# Patient Record
Sex: Female | Born: 1990 | Race: Black or African American | Hispanic: No | Marital: Single | Smoking: Former smoker
Health system: Southern US, Community
[De-identification: ages and names within clinical notes are randomized; demographics above are authoritative.]

## PROBLEM LIST (undated history)

## (undated) ENCOUNTER — Inpatient Hospital Stay (HOSPITAL_COMMUNITY): Payer: Self-pay

## (undated) ENCOUNTER — Inpatient Hospital Stay: Admission: EM | Payer: Self-pay | Source: Home / Self Care

## (undated) DIAGNOSIS — Z789 Other specified health status: Secondary | ICD-10-CM

## (undated) DIAGNOSIS — K8020 Calculus of gallbladder without cholecystitis without obstruction: Secondary | ICD-10-CM

## (undated) HISTORY — PX: WISDOM TOOTH EXTRACTION: SHX21

## (undated) HISTORY — PX: DILATION AND CURETTAGE OF UTERUS: SHX78

---
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
## 2011-03-13 ENCOUNTER — Emergency Department (HOSPITAL_COMMUNITY)
Admission: EM | Admit: 2011-03-13 | Discharge: 2011-03-14 | Disposition: A | Discharge status: Home / Self Care | Payer: Medicaid Other | Attending: Emergency Medicine | Admitting: Emergency Medicine

## 2011-03-13 DIAGNOSIS — H53149 Visual discomfort, unspecified: Secondary | ICD-10-CM | POA: Insufficient documentation

## 2011-03-13 DIAGNOSIS — R51 Headache: Secondary | ICD-10-CM | POA: Insufficient documentation

## 2011-03-13 DIAGNOSIS — M2569 Stiffness of other specified joint, not elsewhere classified: Secondary | ICD-10-CM | POA: Insufficient documentation

## 2011-03-13 DIAGNOSIS — J3489 Other specified disorders of nose and nasal sinuses: Secondary | ICD-10-CM | POA: Insufficient documentation

## 2011-03-13 DIAGNOSIS — J329 Chronic sinusitis, unspecified: Secondary | ICD-10-CM | POA: Insufficient documentation

## 2011-03-14 ENCOUNTER — Emergency Department (HOSPITAL_COMMUNITY): Payer: Medicaid Other

## 2011-06-05 ENCOUNTER — Emergency Department (HOSPITAL_COMMUNITY)
Admission: EM | Admit: 2011-06-05 | Discharge: 2011-06-05 | Disposition: A | Discharge status: Home / Self Care | Payer: Medicaid Other | Attending: Emergency Medicine | Admitting: Emergency Medicine

## 2011-06-05 DIAGNOSIS — N390 Urinary tract infection, site not specified: Secondary | ICD-10-CM | POA: Insufficient documentation

## 2011-06-05 DIAGNOSIS — M545 Low back pain, unspecified: Secondary | ICD-10-CM | POA: Insufficient documentation

## 2011-06-05 DIAGNOSIS — R109 Unspecified abdominal pain: Secondary | ICD-10-CM | POA: Insufficient documentation

## 2011-06-05 LAB — URINALYSIS, ROUTINE W REFLEX MICROSCOPIC
Glucose, UA: NEGATIVE mg/dL
Ketones, ur: NEGATIVE mg/dL
Protein, ur: NEGATIVE mg/dL
pH: 6.5 (ref 5.0–8.0)

## 2011-06-05 LAB — URINE MICROSCOPIC-ADD ON

## 2011-06-08 LAB — URINE CULTURE
Colony Count: >=100000
Culture  Setup Time: 201210281123

## 2011-06-17 ENCOUNTER — Telehealth (HOSPITAL_COMMUNITY): Payer: Self-pay | Admitting: Emergency Medicine

## 2011-06-17 NOTE — ED Notes (Signed)
Rx for bactrim ds #6 -1 po bd x 3 days need to be called to pharmacy written by C.Schinlever,PAC.

## 2012-01-12 ENCOUNTER — Emergency Department (INDEPENDENT_AMBULATORY_CARE_PROVIDER_SITE_OTHER)
Admission: EM | Admit: 2012-01-12 | Discharge: 2012-01-12 | Disposition: A | Discharge status: Home / Self Care | Payer: Medicaid Other | Source: Home / Self Care | Attending: Family Medicine | Admitting: Family Medicine

## 2012-01-12 ENCOUNTER — Encounter (HOSPITAL_COMMUNITY): Payer: Self-pay | Admitting: *Deleted

## 2012-01-12 ENCOUNTER — Emergency Department (HOSPITAL_COMMUNITY)
Admission: EM | Admit: 2012-01-12 | Discharge: 2012-01-12 | Disposition: A | Discharge status: Home / Self Care | Payer: Medicaid Other

## 2012-01-12 DIAGNOSIS — B9689 Other specified bacterial agents as the cause of diseases classified elsewhere: Secondary | ICD-10-CM

## 2012-01-12 DIAGNOSIS — L0291 Cutaneous abscess, unspecified: Secondary | ICD-10-CM

## 2012-01-12 MED ORDER — TRAMADOL HCL 50 MG PO TABS: 50.0000 mg | ORAL_TABLET | Freq: Four times a day (QID) | ORAL | Status: AC | PRN

## 2012-01-12 MED ORDER — PREDNISONE 20 MG PO TABS: ORAL_TABLET | ORAL | Status: AC

## 2012-01-12 MED ORDER — IBUPROFEN 600 MG PO TABS: 600.0000 mg | ORAL_TABLET | Freq: Three times a day (TID) | ORAL | Status: AC

## 2012-01-12 MED ORDER — HYDROXYZINE HCL 25 MG PO TABS: 25.0000 mg | ORAL_TABLET | Freq: Three times a day (TID) | ORAL | Status: AC | PRN

## 2012-01-12 MED ORDER — CEPHALEXIN 500 MG PO CAPS: 500.0000 mg | ORAL_CAPSULE | Freq: Four times a day (QID) | ORAL | Status: AC

## 2012-01-12 NOTE — ED Provider Notes (Signed)
History     CSN: [id number]  Arrival date & time 01/12/12  1117   First MD Initiated Contact with Patient 01/12/12 1140      Chief Complaint  Patient presents with  . Foot Pain    (Consider location/radiation/quality/duration/timing/severity/associated sxs/prior treatment) HPI Comments: 21 year old nondiabetic female. Here complaining of left foot pain, swelling and itchiness for 3 days. Patient states she fell itchiness and was scratching 3 days ago causing abrasions in the dorsum of her left foot. Food got really swollen and tender yesterday. She does have a phone picture of her swollen foot. 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   History reviewed. No pertinent past medical history.  History reviewed. No pertinent past surgical history.  Family History  Problem Relation Age of Onset  . Hypertension Mother   . Diabetes Mother   . Diabetes Other     History  Substance Use Topics  . Smoking status: Former Smoker    Quit date: 01/12/2011  . Smokeless tobacco: Not on file  . Alcohol Use: No    OB History    Grav Para Term Preterm Abortions TAB SAB Ect Mult Living                  Review of Systems  Constitutional: Negative for fever and chills.  Gastrointestinal: Negative for nausea and vomiting.  Skin:       As per HPI  Neurological: Negative for headaches.  All other systems reviewed and are negative.    Allergies  Review of patient's allergies indicates no known allergies.  Home Medications   Current Outpatient Rx  Name Route Sig Dispense Refill  . CEPHALEXIN 500 MG PO CAPS Oral Take 1 capsule (500 mg total) by mouth 4 (four) times daily. 20 capsule 0  . HYDROXYZINE HCL 25 MG PO TABS Oral Take 1 tablet (25 mg total) by mouth every 8 (eight) hours as needed for itching. 12 tablet 0  . IBUPROFEN 600 MG PO TABS Oral Take 1 tablet (600 mg total) by mouth 3 (three) times daily after meals. 30 tablet 0  . PREDNISONE 20 MG PO TABS  1 tab po bid for 3  days 6 tablet 0  . TRAMADOL HCL 50 MG PO TABS Oral Take 1 tablet (50 mg total) by mouth every 6 (six) hours as needed for pain. 15 tablet 0    BP 111/72  Pulse 74  Temp(Src) 98.5 F (36.9 C) (Oral)  Resp 14  SpO2 97%  Physical Exam  Nursing note and vitals reviewed. Constitutional: She is oriented to person, place, and time. She appears well-developed and well-nourished. No distress.  HENT:  Head: Normocephalic and atraumatic.  Cardiovascular: Normal heart sounds.   Pulmonary/Chest: Breath sounds normal.  Neurological: She is alert and oriented to person, place, and time.  Skin:       Superficial linear abrasions with yellow crust and mild base skin swelling in dorsum of left foot, no skin thickening, no fluctuations or focal tenderness, no significant or striking erythema.     ED Course  Procedures (including critical care time)  Labs Reviewed - No data to display No results found.   1. Superficial bacterial skin infection       MDM  Impress allergic inflammatory reaction to insect bites given non erythematous swelling and itchiness, complicated by scratching and possible early signs of infection appear superficial. Swelling already improving and almost resolved compared with yesterdays picture. Prescribed prednisone for 3 days,  ibuprofen, keflex and hydroxyzine.  Return for recheck in 48 h if persistent symptoms return earlier if worsening symptoms despite following treatment.        Sharin Grave, MD 01/15/12 [id number]

## 2012-01-12 NOTE — ED Notes (Signed)
Pt did not answer x 1 

## 2012-01-12 NOTE — ED Notes (Signed)
Pt had itching of her left foot 3 days ago and she has been scratching a lot.  C/o pain dorsum of the  Left  Foot with abrasions noted

## 2012-01-12 NOTE — Discharge Instructions (Signed)
Impression is that you have a superficial skin infection. It is possible that she had an insect bite causing an allergic reaction given day swelling you had just today based on that picture your phone.  Is reassuring that there is no redness or swelling on exam today.  Keep foot elevated, can use ice pad. Take the prescribed medications as instructed. Can also add over-the-counter antibiotic ointment to put over the abrasions. Avoid scratching. Return in 48 hours for recheck if persistent symptoms or return earlier if worsening symptoms like pain, redness, swelling or drainage.

## 2012-02-05 ENCOUNTER — Encounter (HOSPITAL_COMMUNITY): Payer: Self-pay | Admitting: Emergency Medicine

## 2012-02-05 ENCOUNTER — Emergency Department (INDEPENDENT_AMBULATORY_CARE_PROVIDER_SITE_OTHER)
Admission: EM | Admit: 2012-02-05 | Discharge: 2012-02-05 | Disposition: A | Discharge status: Home / Self Care | Payer: Medicaid Other | Source: Home / Self Care | Attending: Family Medicine | Admitting: Family Medicine

## 2012-02-05 DIAGNOSIS — A088 Other specified intestinal infections: Secondary | ICD-10-CM

## 2012-02-05 DIAGNOSIS — A084 Viral intestinal infection, unspecified: Secondary | ICD-10-CM

## 2012-02-05 LAB — POCT URINALYSIS DIP (DEVICE)
Hgb urine dipstick: NEGATIVE
Protein, ur: NEGATIVE mg/dL
Specific Gravity, Urine: 1.02 (ref 1.005–1.030)
Urobilinogen, UA: 0.2 mg/dL (ref 0.0–1.0)

## 2012-02-05 LAB — POCT PREGNANCY, URINE: Preg Test, Ur: NEGATIVE

## 2012-02-05 MED ORDER — LOPERAMIDE HCL 2 MG PO CAPS: 2.0000 mg | ORAL_CAPSULE | Freq: Four times a day (QID) | ORAL | Status: AC | PRN

## 2012-02-05 MED ORDER — ACETAMINOPHEN-CODEINE #3 300-30 MG PO TABS: 1.0000 | ORAL_TABLET | Freq: Four times a day (QID) | ORAL | Status: AC | PRN

## 2012-02-05 MED ORDER — ONDANSETRON 4 MG PO TBDP: 4.0000 mg | ORAL_TABLET | Freq: Three times a day (TID) | ORAL | Status: AC | PRN

## 2012-02-05 NOTE — Discharge Instructions (Signed)
Keep well-hydrated . Can drink low-calorie Gatorade avoid regular Gatorade or sugary drinks as sugar can make your diarrhea worse. Can buy over-the-counter hydration salts and meds with water. Go to the emergency department if worsening abdominal pain or not keeping fluids down.

## 2012-02-05 NOTE — ED Notes (Signed)
Abdominal pain, diarrhea, onset 2 days ago.  No one else at home having symptoms.  Diarrhea has slowed down, minimal vomiting episodes. Unknown fever.

## 2012-02-05 NOTE — ED Provider Notes (Signed)
History     CSN: [id number]  Arrival date & time 02/05/12  1458   First MD Initiated Contact with Patient 02/05/12 [phone]      Chief Complaint  Patient presents with  . Abdominal Pain    (Consider location/radiation/quality/duration/timing/severity/associated sxs/prior treatment) HPI Comments: 21 year old female with no significant past medical history. Here complaining of 2 days with generalized abdominal cramping type of pain associated with nausea and one episode of emesis food content, no blood (last night). Daily diarrhea 4-5 episodes watery with no blood or mucus. Feels nauseous today but no vomiting. Last diarrhea about 4 hours ago. Still generalized abdominal pain. Decreased appetite but drinking fluids. No rash. No fever although has felt sweaty. Denies pelvic pain, burning on urination or vaginal discharge. No known sick contacts.    History reviewed. No pertinent past medical history.  History reviewed. No pertinent past surgical history.  Family History  Problem Relation Age of Onset  . Hypertension Mother   . Diabetes Mother   . Diabetes Other     History  Substance Use Topics  . Smoking status: Former Smoker    Quit date: 01/12/2011  . Smokeless tobacco: Not on file  . Alcohol Use: No    OB History    Grav Para Term Preterm Abortions TAB SAB Ect Mult Living                  Review of Systems  Constitutional: Positive for appetite change. Negative for fever, chills and diaphoresis.  HENT: Negative for congestion, sore throat, rhinorrhea, trouble swallowing, neck pain and neck stiffness.   Cardiovascular: Negative for chest pain, palpitations and leg swelling.  Gastrointestinal: Positive for nausea, vomiting, abdominal pain and diarrhea. Negative for constipation and abdominal distention.  Genitourinary: Negative for dysuria, hematuria, flank pain, vaginal discharge and pelvic pain.  Musculoskeletal: Negative for back pain and arthralgias.  Skin: Negative  for rash.  Neurological: Negative for dizziness, tremors, seizures, syncope, weakness, numbness and headaches.    Allergies  Review of patient's allergies indicates no known allergies.  Home Medications   Current Outpatient Rx  Name Route Sig Dispense Refill  . ACETAMINOPHEN-CODEINE #3 300-30 MG PO TABS Oral Take 1-2 tablets by mouth every 6 (six) hours as needed for pain. 15 tablet 0  . LOPERAMIDE HCL 2 MG PO CAPS Oral Take 1 capsule (2 mg total) by mouth 4 (four) times daily as needed for diarrhea or loose stools. 12 capsule 0  . ONDANSETRON 4 MG PO TBDP Oral Take 1 tablet (4 mg total) by mouth every 8 (eight) hours as needed for nausea. 10 tablet 0    BP 125/82  Pulse 57  Temp 98.3 F (36.8 C)  Resp 15  SpO2 100%  LMP 01/17/2012  Physical Exam  Nursing note and vitals reviewed. Constitutional: She is oriented to person, place, and time. She appears well-developed and well-nourished. No distress.       Morbidly obese  HENT:  Head: Normocephalic and atraumatic.  Mouth/Throat: Oropharynx is clear and moist. No oropharyngeal exudate.       Moist mucus membranes  Eyes: Conjunctivae are normal. Pupils are equal, round, and reactive to light. No scleral icterus.  Neck: Neck supple. No thyromegaly present.  Cardiovascular: Normal rate, regular rhythm and normal heart sounds.   Pulmonary/Chest: Effort normal and breath sounds normal.  Abdominal: Soft. Bowel sounds are normal. She exhibits no distension and no mass. There is tenderness. There is no rebound and no guarding.  Reported generalized abdominal pain. No CVT  Lymphadenopathy:    She has no cervical adenopathy.  Neurological: She is alert and oriented to person, place, and time.  Skin: No rash noted. She is not diaphoretic.    ED Course  Procedures (including critical care time)   Labs Reviewed  POCT URINALYSIS DIP (DEVICE)  POCT PREGNANCY, URINE   No results found.   1. Viral gastroenteritis        MDM  Normal UA, no ketones. Negative pregnancy test. Clinically well. Impress viral gastroenteritis. Treated symptomatically with ondansetron and I Imodium. Asked to return to the emergency department if worsening persistent abdominal pain, persistent vomiting or not keeping fluids down.        Sharin Grave, MD 02/08/12 [phone]

## 2012-05-24 ENCOUNTER — Emergency Department (INDEPENDENT_AMBULATORY_CARE_PROVIDER_SITE_OTHER)
Admission: EM | Admit: 2012-05-24 | Discharge: 2012-05-24 | Disposition: A | Discharge status: Home / Self Care | Payer: Medicaid Other | Source: Home / Self Care | Attending: Emergency Medicine | Admitting: Emergency Medicine

## 2012-05-24 ENCOUNTER — Encounter (HOSPITAL_COMMUNITY): Payer: Self-pay

## 2012-05-24 DIAGNOSIS — J029 Acute pharyngitis, unspecified: Secondary | ICD-10-CM

## 2012-05-24 MED ORDER — NAPROXEN 500 MG PO TABS: 500.0000 mg | ORAL_TABLET | Freq: Two times a day (BID) | ORAL | Status: DC

## 2012-05-24 MED ORDER — PREDNISONE 5 MG PO KIT: 1.0000 | PACK | Freq: Every day | ORAL | Status: DC

## 2012-05-24 NOTE — ED Notes (Signed)
Patient complains of sore throat (says hard to swallow) with headache, chills x 2 days

## 2012-05-24 NOTE — ED Provider Notes (Signed)
Chief Complaint  Patient presents with  . Sore Throat    History of Present Illness:   The patient is a 21 year old female who presents with a three-day history of strep throat, pain on swallowing, chills, headache, slight cough, abdominal pain, and diarrhea. She has had no known exposure to strep but does have a history of strep in the past. She denies any fever, nasal congestion, rhinorrhea, wheezing, nausea, or vomiting.  Review of Systems:  Other than as noted above, the patient denies any of the following symptoms. Systemic:  No fever, chills, sweats, fatigue, myalgias, headache, or anorexia. Eye:  No redness, pain or drainage. ENT:  No earache, ear congestion, nasal congestion, sneezing, rhinorrhea, sinus pressure, sinus pain, or post nasal drip. Lungs:  No cough, sputum production, wheezing, shortness of breath, or chest pain. GI:  No abdominal pain, nausea, vomiting, or diarrhea. Skin:  No rash or itching.  PMFSH:  Past medical history, family history, social history, meds, allergies, and nurse's notes were reviewed.  There is no known exposure to strep or mono.  No prior history of step or mono.  The patient denies use of tobacco.  Physical Exam:   Vital signs:  BP 119/53  Pulse 61  Temp 98.6 F (37 C) (Oral)  Resp 18  SpO2 100%  LMP 05/13/2012 General:  Alert, in no distress. Eye:  No conjunctival injection or drainage. Lids were normal. ENT:  TMs and canals were normal, without erythema or inflammation.  Nasal mucosa was clear and uncongested, without drainage.  Mucous membranes were moist.  Exam of pharynx shows erythema and swelling but no exudate.  There were no oral ulcerations or lesions. Neck:  Supple, no adenopathy, tenderness or mass. Lungs:  No respiratory distress.  Lungs were clear to auscultation, without wheezes, rales or rhonchi.  Breath sounds were clear and equal bilaterally.  Heart:  Regular rhythm, without gallops, murmers or rubs. Skin:  Clear, warm, and  dry, without rash or lesions.  Labs:   Results for orders placed during the hospital encounter of 05/24/12  POCT RAPID STREP A ([facility] ONLY)      Component Value Range   Streptococcus, Group A Screen (Direct) NEGATIVE  NEGATIVE    Assessment:  The encounter diagnosis was Viral pharyngitis.  Plan:   1.  The following meds were prescribed:   New Prescriptions   NAPROXEN (NAPROSYN) 500 MG TABLET    Take 1 tablet (500 mg total) by mouth 2 (two) times daily.   PREDNISONE 5 MG KIT    Take 1 kit (5 mg total) by mouth daily after breakfast. Prednisone 5 mg 6 day dosepack.  Take as directed.   2.  The patient was instructed in symptomatic care including hot saline gargles, throat lozenges, infectious precautions, and need to trade out toothbrush. Handouts were given. 3.  The patient was told to return if becoming worse in any way, if no better in 3 or 4 days, and given some red flag symptoms that would indicate earlier return.    Reuben Likes, MD 05/24/12 [phone]

## 2012-09-04 ENCOUNTER — Inpatient Hospital Stay (HOSPITAL_COMMUNITY): Admission: AD | Admit: 2012-09-04 | Payer: Self-pay | Source: Ambulatory Visit | Admitting: Obstetrics

## 2013-01-10 ENCOUNTER — Inpatient Hospital Stay (HOSPITAL_COMMUNITY): Payer: Medicaid Other

## 2013-01-10 ENCOUNTER — Inpatient Hospital Stay (HOSPITAL_COMMUNITY)
Admission: AD | Admit: 2013-01-10 | Discharge: 2013-01-10 | Disposition: A | Discharge status: Home / Self Care | Payer: Medicaid Other | Source: Ambulatory Visit | Attending: Obstetrics and Gynecology | Admitting: Obstetrics and Gynecology

## 2013-01-10 ENCOUNTER — Encounter (HOSPITAL_COMMUNITY): Payer: Self-pay | Admitting: *Deleted

## 2013-01-10 DIAGNOSIS — O2390 Unspecified genitourinary tract infection in pregnancy, unspecified trimester: Secondary | ICD-10-CM | POA: Insufficient documentation

## 2013-01-10 DIAGNOSIS — B9689 Other specified bacterial agents as the cause of diseases classified elsewhere: Secondary | ICD-10-CM | POA: Insufficient documentation

## 2013-01-10 DIAGNOSIS — Z3490 Encounter for supervision of normal pregnancy, unspecified, unspecified trimester: Secondary | ICD-10-CM

## 2013-01-10 DIAGNOSIS — R109 Unspecified abdominal pain: Secondary | ICD-10-CM | POA: Insufficient documentation

## 2013-01-10 DIAGNOSIS — N760 Acute vaginitis: Secondary | ICD-10-CM | POA: Insufficient documentation

## 2013-01-10 DIAGNOSIS — A499 Bacterial infection, unspecified: Secondary | ICD-10-CM

## 2013-01-10 HISTORY — DX: Other specified health status: Z78.9

## 2013-01-10 LAB — URINALYSIS, ROUTINE W REFLEX MICROSCOPIC
Bilirubin Urine: NEGATIVE
Hgb urine dipstick: NEGATIVE
Specific Gravity, Urine: 1.025 (ref 1.005–1.030)
pH: 6.5 (ref 5.0–8.0)

## 2013-01-10 LAB — CBC
MCH: 29.4 pg (ref 26.0–34.0)
MCHC: 33.6 g/dL (ref 30.0–36.0)
Platelets: 366 10*3/uL (ref 150–400)
RDW: 13.3 % (ref 11.5–15.5)

## 2013-01-10 LAB — WET PREP, GENITAL
Trich, Wet Prep: NONE SEEN
Yeast Wet Prep HPF POC: NONE SEEN

## 2013-01-10 LAB — HCG, QUANTITATIVE, PREGNANCY: hCG, Beta Chain, Quant, S: 11664 m[IU]/mL — ABNORMAL HIGH (ref <5)

## 2013-01-10 MED ORDER — METRONIDAZOLE 500 MG PO TABS: 500.0000 mg | ORAL_TABLET | Freq: Two times a day (BID) | ORAL | Status: DC

## 2013-01-10 NOTE — MAU Note (Signed)
Pt states she didn't have a period in May and C/O abd pain.

## 2013-01-10 NOTE — MAU Provider Note (Signed)
History     CSN: [id number]  Arrival date and time: 01/10/13 1232   First Provider Initiated Contact with Patient 01/10/13 1302      No chief complaint on file.  HPI 22 y.o. G2P0010 at [redacted]w[redacted]d with low abd pain x 1 week. No bleeding. Patient's last menstrual period was 11/28/2012.   Past Medical History  Diagnosis Date  . Medical history non-contributory     Past Surgical History  Procedure Laterality Date  . No past surgeries      Family History  Problem Relation Age of Onset  . Hypertension Mother   . Diabetes Mother   . Diabetes Other     History  Substance Use Topics  . Smoking status: Former Smoker    Quit date: 01/12/2011  . Smokeless tobacco: Not on file  . Alcohol Use: No    Allergies: No Known Allergies  No prescriptions prior to admission    Review of Systems  Constitutional: Negative.   Respiratory: Negative.   Cardiovascular: Negative.   Gastrointestinal: Positive for abdominal pain. Negative for nausea, vomiting, diarrhea and constipation.  Genitourinary: Negative for dysuria, urgency, frequency, hematuria and flank pain.       Negative for vaginal bleeding, vaginal discharge  Musculoskeletal: Negative.   Neurological: Negative.   Psychiatric/Behavioral: Negative.    Physical Exam   Blood pressure 119/69, pulse 75, temperature 98.5 F (36.9 C), temperature source Oral, resp. rate 18, height 5\' 2"  (1.575 m), weight 177 lb 6.4 oz (80.468 kg), last menstrual period 11/28/2012, SpO2 100.00%.  Physical Exam  Nursing note and vitals reviewed. Constitutional: She is oriented to person, place, and time. She appears well-developed and well-nourished. No distress.  HENT:  Head: Normocephalic and atraumatic.  Cardiovascular: Normal rate and regular rhythm.   Respiratory: Effort normal. No respiratory distress.  GI: Soft. She exhibits no distension and no mass. There is no tenderness. There is no rebound and no guarding.  Genitourinary: There is no  rash or lesion on the right labia. There is no rash or lesion on the left labia. Uterus is tender. Uterus is not deviated, not enlarged and not fixed. Cervix exhibits no motion tenderness, no discharge and no friability. Right adnexum displays tenderness. Right adnexum displays no mass and no fullness. Left adnexum displays tenderness. Left adnexum displays no mass and no fullness. No erythema, tenderness or bleeding around the vagina. No vaginal discharge found.  Neurological: She is alert and oriented to person, place, and time.  Skin: Skin is warm and dry.  Psychiatric: She has a normal mood and affect.    MAU Course  Procedures  Results for orders placed during the hospital encounter of 01/10/13 (from the past 24 hour(s))  URINALYSIS, ROUTINE W REFLEX MICROSCOPIC     Status: Abnormal   Collection Time    01/10/13 12:35 PM      Result Value Range   Color, Urine YELLOW  YELLOW   APPearance CLEAR  CLEAR   Specific Gravity, Urine 1.025  1.005 - 1.030   pH 6.5  5.0 - 8.0   Glucose, UA NEGATIVE  NEGATIVE mg/dL   Hgb urine dipstick NEGATIVE  NEGATIVE   Bilirubin Urine NEGATIVE  NEGATIVE   Ketones, ur NEGATIVE  NEGATIVE mg/dL   Protein, ur NEGATIVE  NEGATIVE mg/dL   Urobilinogen, UA 0.2  0.0 - 1.0 mg/dL   Nitrite NEGATIVE  NEGATIVE   Leukocytes, UA SMALL (*) NEGATIVE  URINE MICROSCOPIC-ADD ON     Status: Abnormal   Collection  Time    01/10/13 12:35 PM      Result Value Range   Squamous Epithelial / LPF FEW (*) RARE   WBC, UA 3-6  <3 WBC/hpf  POCT PREGNANCY, URINE     Status: Abnormal   Collection Time    01/10/13 12:48 PM      Result Value Range   Preg Test, Ur POSITIVE (*) NEGATIVE  WET PREP, GENITAL     Status: Abnormal   Collection Time    01/10/13  1:01 PM      Result Value Range   Yeast Wet Prep HPF POC NONE SEEN  NONE SEEN   Trich, Wet Prep NONE SEEN  NONE SEEN   Clue Cells Wet Prep HPF POC FEW (*) NONE SEEN   WBC, Wet Prep HPF POC FEW (*) NONE SEEN  CBC     Status:  Abnormal   Collection Time    01/10/13  1:01 PM      Result Value Range   WBC 11.0 (*) 4.0 - 10.5 K/uL   RBC 4.19  3.87 - 5.11 MIL/uL   Hemoglobin 12.3  12.0 - 15.0 g/dL   HCT 16.1  09.6 - 04.5 %   MCV 87.4  78.0 - 100.0 fL   MCH 29.4  26.0 - 34.0 pg   MCHC 33.6  30.0 - 36.0 g/dL   RDW 40.9  81.1 - 91.4 %   Platelets 366  150 - 400 K/uL  HCG, QUANTITATIVE, PREGNANCY     Status: Abnormal   Collection Time    01/10/13  1:01 PM      Result Value Range   hCG, Beta Chain, Quant, S 11664 (*) <5 mIU/mL   U/S: 6.1 week IUP, + FHR  Assessment and Plan  21 y.o. G2P0010 at [redacted]w[redacted]d 1. BV (bacterial vaginosis)   2. Normal IUP (intrauterine pregnancy) on prenatal ultrasound   Pregnancy verification letter given    Medication List    TAKE these medications       metroNIDAZOLE 500 MG tablet  Commonly known as:  FLAGYL  Take 1 tablet (500 mg total) by mouth 2 (two) times daily.            Follow-up Information   Schedule an appointment as soon as possible for a visit with prenatal care provider of your choice.        Sevin Langenbach 01/10/2013, 2:24 PM

## 2013-01-11 LAB — GC/CHLAMYDIA PROBE AMP
CT Probe RNA: NEGATIVE
GC Probe RNA: NEGATIVE

## 2013-01-11 NOTE — MAU Provider Note (Signed)
Attestation of Attending Supervision of Advanced Practitioner (CNM/NP): Evaluation and management procedures were performed by the Advanced Practitioner under my supervision and collaboration.  I have reviewed the Advanced Practitioner's note and chart, and I agree with the management and plan.  Tannen Vandezande 01/11/2013 4:35 PM

## 2013-01-16 ENCOUNTER — Encounter (HOSPITAL_COMMUNITY): Payer: Self-pay | Admitting: *Deleted

## 2013-01-16 ENCOUNTER — Inpatient Hospital Stay (HOSPITAL_COMMUNITY)
Admission: AD | Admit: 2013-01-16 | Discharge: 2013-01-16 | Disposition: A | Discharge status: Home / Self Care | Payer: Medicaid Other | Source: Ambulatory Visit | Attending: Obstetrics & Gynecology | Admitting: Obstetrics & Gynecology

## 2013-01-16 DIAGNOSIS — O99891 Other specified diseases and conditions complicating pregnancy: Secondary | ICD-10-CM | POA: Insufficient documentation

## 2013-01-16 DIAGNOSIS — T50905A Adverse effect of unspecified drugs, medicaments and biological substances, initial encounter: Secondary | ICD-10-CM

## 2013-01-16 DIAGNOSIS — R112 Nausea with vomiting, unspecified: Secondary | ICD-10-CM

## 2013-01-16 DIAGNOSIS — O210 Mild hyperemesis gravidarum: Secondary | ICD-10-CM | POA: Insufficient documentation

## 2013-01-16 DIAGNOSIS — R079 Chest pain, unspecified: Secondary | ICD-10-CM | POA: Insufficient documentation

## 2013-01-16 LAB — URINALYSIS, ROUTINE W REFLEX MICROSCOPIC
Glucose, UA: NEGATIVE mg/dL
Hgb urine dipstick: NEGATIVE
Ketones, ur: 15 mg/dL — AB
Protein, ur: NEGATIVE mg/dL

## 2013-01-16 MED ORDER — PROMETHAZINE HCL 25 MG PO TABS: 12.5000 mg | ORAL_TABLET | Freq: Four times a day (QID) | ORAL | Status: DC | PRN

## 2013-01-16 MED ORDER — PROMETHAZINE HCL 25 MG/ML IJ SOLN
25.0000 mg | Freq: Once | INTRAVENOUS | Status: AC
  Administered 2013-01-16: 25 mg via INTRAVENOUS
  Filled 2013-01-16: qty 1

## 2013-01-16 MED ORDER — METRONIDAZOLE 0.75 % VA GEL: 1.0000 | Freq: Two times a day (BID) | VAGINAL | Status: DC

## 2013-01-16 NOTE — MAU Note (Signed)
PT SAYS SHE WAS HERE LAST WEEK- GAVE HER RX- AND ALSO HAD  BV-    TOOK MED- THEN VOMITED  AND HAS BEEN VOMITING ALL DAY.

## 2013-01-16 NOTE — MAU Provider Note (Signed)
Chief Complaint:  No chief complaint on file.    First Provider Initiated Contact with Patient 01/16/13 0137     HPI: Jade Brown is a 22 y.o. G2P0010 at [redacted]w[redacted]d who presents to maternity admissions reporting  vomiting resulting chest pain since last night. Seen in MAU 01/10/13. IUP confirmed. Dx BV. Rx Flagyl. Vomits 20 minutes after each dose.   Chest pain is substernal down to epigastric. Denies SOB, radiation to jaw or left arm, palpitations, fever, chills, mornign sickness, sick contacts, diarrhea, abd, pain, vaginal discharge or vaginal bleeding.   Past Medical History: Past Medical History  Diagnosis Date  . Medical history non-contributory     Past obstetric history: OB History   Grav Para Term Preterm Abortions TAB SAB Ect Mult Living   2    1     0     # Outc Date GA Lbr Len/2nd Wgt Sex Del Anes PTL Lv   1 ABT            2 CUR               Past Surgical History: Past Surgical History  Procedure Laterality Date  . No past surgeries      Family History: Family History  Problem Relation Age of Onset  . Hypertension Mother   . Diabetes Mother   . Diabetes Other     Social History: History  Substance Use Topics  . Smoking status: Former Smoker    Quit date: 01/12/2011  . Smokeless tobacco: Not on file  . Alcohol Use: No    Allergies:  Allergies  Allergen Reactions  . Flagyl (Metronidazole) Nausea And Vomiting    Meds:  Prescriptions prior to admission  Medication Sig Dispense Refill  . [DISCONTINUED] metroNIDAZOLE (FLAGYL) 500 MG tablet Take 1 tablet (500 mg total) by mouth 2 (two) times daily.  14 tablet  0    ROS: Pertinent findings in history of present illness.  Physical Exam  Blood pressure 129/66, pulse 74, temperature 98.1 F (36.7 C), temperature source Oral, resp. rate 20, height 5\' 1"  (1.549 m), weight 79.039 kg (174 lb 4 oz), last menstrual period 11/28/2012. GENERAL: Well-developed, well-nourished female in moderate distress w/  vomiting/retching.  HEENT: normocephalic. Mucus membrane moist.  HEART: normal rate RESP: normal effort ABDOMEN: Soft, non-tender, gravid appropriate for gestational age EXTREMITIES: Nontender, no edema NEURO: alert and oriented SPECULUM EXAM: Deferred   Labs: Results for orders placed during the hospital encounter of 01/16/13 (from the past 24 hour(s))  URINALYSIS, ROUTINE W REFLEX MICROSCOPIC     Status: Abnormal   Collection Time    01/16/13 12:52 AM      Result Value Range   Color, Urine YELLOW  YELLOW   APPearance CLEAR  CLEAR   Specific Gravity, Urine >1.030 (*) 1.005 - 1.030   pH 6.0  5.0 - 8.0   Glucose, UA NEGATIVE  NEGATIVE mg/dL   Hgb urine dipstick NEGATIVE  NEGATIVE   Bilirubin Urine NEGATIVE  NEGATIVE   Ketones, ur 15 (*) NEGATIVE mg/dL   Protein, ur NEGATIVE  NEGATIVE mg/dL   Urobilinogen, UA 1.0  0.0 - 1.0 mg/dL   Nitrite NEGATIVE  NEGATIVE   Leukocytes, UA NEGATIVE  NEGATIVE    Imaging:  NA  MAU Course: Phenergan and 1 liter IV fluids given.   Feeling much better and tolerating POs.  Assessment: 1. Medication side effect, initial encounter   2. Nausea and vomiting in adult   3. Other  current maternal conditions classifiable elsewhere, antepartum     Plan: Discharge home in stable condition. Advance diet slowly.      Follow-up Information   Please follow up. (Start prental care)       Follow up with [facility] [place] [facility]. (As needed if symptoms worsen)    Contact information:   [address] [id number] [place] [address] [phone]       Medication List    STOP taking these medications       metroNIDAZOLE 500 MG tablet  Commonly known as:  FLAGYL      TAKE these medications       metroNIDAZOLE 0.75 % vaginal gel  Commonly known as:  METROGEL VAGINAL  Place 1 Applicatorful vaginally 2 (two) times daily.     promethazine 25 MG tablet  Commonly known as:  PHENERGAN  Take 0.5-1  tablets (12.5-25 mg total) by mouth every 6 (six) hours as needed for nausea.        [place], CNM 01/16/2013 3:36 AM

## 2013-01-18 ENCOUNTER — Encounter (HOSPITAL_COMMUNITY): Payer: Self-pay | Admitting: *Deleted

## 2013-01-18 ENCOUNTER — Inpatient Hospital Stay (HOSPITAL_COMMUNITY)
Admission: AD | Admit: 2013-01-18 | Discharge: 2013-01-18 | Disposition: A | Discharge status: Home / Self Care | Payer: Medicaid Other | Source: Ambulatory Visit | Attending: Family Medicine | Admitting: Family Medicine

## 2013-01-18 DIAGNOSIS — O99891 Other specified diseases and conditions complicating pregnancy: Secondary | ICD-10-CM | POA: Insufficient documentation

## 2013-01-18 DIAGNOSIS — O219 Vomiting of pregnancy, unspecified: Secondary | ICD-10-CM

## 2013-01-18 DIAGNOSIS — K219 Gastro-esophageal reflux disease without esophagitis: Secondary | ICD-10-CM | POA: Insufficient documentation

## 2013-01-18 DIAGNOSIS — O210 Mild hyperemesis gravidarum: Secondary | ICD-10-CM | POA: Insufficient documentation

## 2013-01-18 LAB — URINALYSIS, ROUTINE W REFLEX MICROSCOPIC
Glucose, UA: NEGATIVE mg/dL
Ketones, ur: NEGATIVE mg/dL
Leukocytes, UA: NEGATIVE
pH: 6.5 (ref 5.0–8.0)

## 2013-01-18 MED ORDER — DOXYLAMINE-PYRIDOXINE 10-10 MG PO TBEC: 2.0000 | DELAYED_RELEASE_TABLET | Freq: Every day | ORAL | Status: DC

## 2013-01-18 MED ORDER — FAMOTIDINE IN NACL 20-0.9 MG/50ML-% IV SOLN
20.0000 mg | Freq: Once | INTRAVENOUS | Status: AC
  Administered 2013-01-18: 20 mg via INTRAVENOUS
  Filled 2013-01-18: qty 50

## 2013-01-18 MED ORDER — ONDANSETRON 8 MG PO TBDP: 8.0000 mg | ORAL_TABLET | Freq: Three times a day (TID) | ORAL | Status: DC | PRN

## 2013-01-18 MED ORDER — ONDANSETRON HCL 4 MG/2ML IJ SOLN
4.0000 mg | Freq: Once | INTRAMUSCULAR | Status: AC
  Administered 2013-01-18: 4 mg via INTRAVENOUS
  Filled 2013-01-18: qty 2

## 2013-01-18 MED ORDER — LACTATED RINGERS IV BOLUS (SEPSIS)
1000.0000 mL | Freq: Once | INTRAVENOUS | Status: AC
  Administered 2013-01-18: 1000 mL via INTRAVENOUS

## 2013-01-18 MED ORDER — FAMOTIDINE 20 MG PO TABS: 20.0000 mg | ORAL_TABLET | Freq: Two times a day (BID) | ORAL | Status: DC

## 2013-01-18 NOTE — Progress Notes (Signed)
Pt states has not started taking her nausea medication and her chest is burning now

## 2013-01-18 NOTE — Progress Notes (Signed)
Pt states she has had a slight headache

## 2013-01-18 NOTE — MAU Note (Addendum)
Sickness and pains continue.  Still not able to eat.  Pain is mid-sternum, between breasts- hurts when she throws up.  Was given prescriptions, has not taken promethazine- because she hasn't eaten.

## 2013-01-18 NOTE — MAU Provider Note (Signed)
History     CSN: [id number]  Arrival date and time: 01/18/13 1535   None     Chief Complaint  Patient presents with  . Emesis During Pregnancy   HPI  Pt is [redacted]w[redacted]d pregnant with confirmation of viable IUP on 01/10/2013.  Pt presents with ongoing N&V after being seen on 01/16/2013.  Pt has not been able to keep any food down and has not taken Phenergan since she could not eat. Pt has mid sternum burning pain which is worse when she eats.  Pt has vomited 2 times today.  Pt denies spotting or bleeding or lower abdominal pain.  Past Medical History  Diagnosis Date  . Medical history non-contributory     Past Surgical History  Procedure Laterality Date  . No past surgeries      Family History  Problem Relation Age of Onset  . Hypertension Mother   . Diabetes Mother   . Diabetes Other     History  Substance Use Topics  . Smoking status: Former Smoker    Quit date: 01/12/2011  . Smokeless tobacco: Not on file  . Alcohol Use: No    Allergies:  Allergies  Allergen Reactions  . Flagyl (Metronidazole) Nausea And Vomiting    Prescriptions prior to admission  Medication Sig Dispense Refill  . metroNIDAZOLE (METROGEL VAGINAL) 0.75 % vaginal gel Place 1 Applicatorful vaginally 2 (two) times daily.  70 g  0  . promethazine (PHENERGAN) 25 MG tablet Take 0.5-1 tablets (12.5-25 mg total) by mouth every 6 (six) hours as needed for nausea.  30 tablet  1    ROS Physical Exam   Blood pressure 134/81, pulse 88, temperature 98.2 F (36.8 C), temperature source Oral, resp. rate 18, height 5\' 1"  (1.549 m), weight 77.565 kg (171 lb), last menstrual period 11/28/2012.  Physical Exam  Nursing note and vitals reviewed. Constitutional: She is oriented to person, place, and time. She appears well-developed and well-nourished. No distress.  HENT:  Head: Normocephalic.  Eyes: Pupils are equal, round, and reactive to light.  Neck: Normal range of motion. Neck supple.  Cardiovascular: Normal  rate.   Respiratory: Effort normal.  GI: Soft.  Musculoskeletal: Normal range of motion.  Neurological: She is alert and oriented to person, place, and time.  Skin: Skin is warm and dry.  Psychiatric: She has a normal mood and affect.    MAU Course  Procedures Pt's epigastric pain resolved and nausea resolved with zofran and Pepcid IV Pt able to tolerate crackers and PO fluids Results for orders placed during the hospital encounter of 01/18/13 (from the past 24 hour(s))  URINALYSIS, ROUTINE W REFLEX MICROSCOPIC     Status: Abnormal   Collection Time    01/18/13  3:50 PM      Result Value Range   Color, Urine YELLOW  YELLOW   APPearance CLEAR  CLEAR   Specific Gravity, Urine 1.025  1.005 - 1.030   pH 6.5  5.0 - 8.0   Glucose, UA NEGATIVE  NEGATIVE mg/dL   Hgb urine dipstick NEGATIVE  NEGATIVE   Bilirubin Urine NEGATIVE  NEGATIVE   Ketones, ur NEGATIVE  NEGATIVE mg/dL   Protein, ur NEGATIVE  NEGATIVE mg/dL   Urobilinogen, UA 2.0 (*) 0.0 - 1.0 mg/dL   Nitrite NEGATIVE  NEGATIVE   Leukocytes, UA NEGATIVE  NEGATIVE    Assessment and Plan  Nausea and vomiting in pregnancy- prescriptions for Diclegis and zofran GERD- Pepcid prescription F/u with OB of choice- pt has  appt with [facility] 01/18/2013, 4:13 PM

## 2013-01-18 NOTE — MAU Note (Signed)
Pt states she when she left her on the 10th she was feeling better and  started feeling bad again yesterday

## 2013-01-19 NOTE — MAU Provider Note (Signed)
Chart reviewed and agree with management and plan.  

## 2013-01-30 LAB — OB RESULTS CONSOLE GC/CHLAMYDIA
Chlamydia: NEGATIVE
GC PROBE AMP, GENITAL: NEGATIVE

## 2013-02-12 ENCOUNTER — Ambulatory Visit (INDEPENDENT_AMBULATORY_CARE_PROVIDER_SITE_OTHER): Payer: Medicaid Other | Admitting: Obstetrics

## 2013-02-12 ENCOUNTER — Encounter: Payer: Self-pay | Admitting: Obstetrics

## 2013-02-12 VITALS — BP 110/73 | Temp 98.4°F | Wt 173.0 lb

## 2013-02-12 DIAGNOSIS — Z3400 Encounter for supervision of normal first pregnancy, unspecified trimester: Secondary | ICD-10-CM

## 2013-02-12 DIAGNOSIS — Z3401 Encounter for supervision of normal first pregnancy, first trimester: Secondary | ICD-10-CM

## 2013-02-12 LAB — POCT URINALYSIS DIPSTICK
Bilirubin, UA: NEGATIVE
Blood, UA: NEGATIVE
Glucose, UA: NEGATIVE
Leukocytes, UA: NEGATIVE
Nitrite, UA: NEGATIVE
Urobilinogen, UA: NEGATIVE

## 2013-02-12 NOTE — Progress Notes (Signed)
Pulse- 76 Subjective:    Jade Brown is being seen today for her first obstetrical visit.  This is not a planned pregnancy. She is at [redacted]w[redacted]d gestation. Her obstetrical history is significant for nothing. Relationship with FOB: significant other, living together. Patient does intend to breast feed. Pregnancy history fully reviewed.  Menstrual History: OB History   Grav Para Term Preterm Abortions TAB SAB Ect Mult Living   2    1     0      Menarche age: 60 Patient's last menstrual period was 11/28/2012.    The following portions of the patient's history were reviewed and updated as appropriate: allergies, current medications, past family history, past medical history, past social history, past surgical history and problem list.  Review of Systems Pertinent items are noted in HPI.    Objective:    General appearance: alert and no distress Abdomen: normal findings: soft, non-tender Pelvic: cervix normal in appearance, external genitalia normal, no adnexal masses or tenderness, no cervical motion tenderness, vagina normal without discharge and uterus enlarged, soft, NT.    Assessment:    Pregnancy at [redacted]w[redacted]d weeks    Plan:    Initial labs drawn. Prenatal vitamins. Problem list reviewed and updated. AFP3 discussed: requested. Role of ultrasound in pregnancy discussed; fetal survey: requested. Amniocentesis discussed: not indicated. Follow up in 5 weeks. 50% of 20 min visit spent on counseling and coordination of care.

## 2013-02-13 LAB — OBSTETRIC PANEL
Basophils Relative: 0 % (ref 0–1)
Eosinophils Absolute: 0.2 10*3/uL (ref 0.0–0.7)
Eosinophils Relative: 2 % (ref 0–5)
Hepatitis B Surface Ag: NEGATIVE
MCH: 29.1 pg (ref 26.0–34.0)
MCHC: 33.1 g/dL (ref 30.0–36.0)
MCV: 87.9 fL (ref 78.0–100.0)
Monocytes Relative: 7 % (ref 3–12)
Neutrophils Relative %: 68 % (ref 43–77)
Platelets: 407 10*3/uL — ABNORMAL HIGH (ref 150–400)
Rh Type: NEGATIVE

## 2013-02-13 LAB — HIV ANTIBODY (ROUTINE TESTING W REFLEX): HIV: NONREACTIVE

## 2013-02-14 LAB — HEMOGLOBINOPATHY EVALUATION
Hgb A2 Quant: 2.6 % (ref 2.2–3.2)
Hgb A: 97.4 % (ref 96.8–97.8)
Hgb F Quant: 0 % (ref 0.0–2.0)

## 2013-02-14 LAB — CULTURE, OB URINE
Colony Count: NO GROWTH
Organism ID, Bacteria: NO GROWTH

## 2013-02-21 ENCOUNTER — Other Ambulatory Visit: Payer: Self-pay | Admitting: *Deleted

## 2013-02-21 MED ORDER — VITAFOL ULTRA 29-0.6-0.4-200 MG PO CAPS: 1.0000 | ORAL_CAPSULE | Freq: Every day | ORAL | Status: DC

## 2013-03-19 ENCOUNTER — Ambulatory Visit (INDEPENDENT_AMBULATORY_CARE_PROVIDER_SITE_OTHER): Payer: Medicaid Other | Admitting: Obstetrics

## 2013-03-19 VITALS — BP 126/66 | Temp 97.8°F | Wt 177.0 lb

## 2013-03-19 DIAGNOSIS — Z3480 Encounter for supervision of other normal pregnancy, unspecified trimester: Secondary | ICD-10-CM

## 2013-03-19 DIAGNOSIS — Z369 Encounter for antenatal screening, unspecified: Secondary | ICD-10-CM

## 2013-03-19 DIAGNOSIS — Z3482 Encounter for supervision of other normal pregnancy, second trimester: Secondary | ICD-10-CM

## 2013-03-19 LAB — POCT URINALYSIS DIPSTICK
Bilirubin, UA: NEGATIVE
Ketones, UA: NEGATIVE
Leukocytes, UA: NEGATIVE
Protein, UA: NEGATIVE
Spec Grav, UA: 1.025
pH, UA: 5

## 2013-03-19 NOTE — Progress Notes (Signed)
Pulse-93 Pt c/o back pain.

## 2013-03-21 LAB — AFP, QUAD SCREEN
AFP: 25.6 IU/mL
HCG, Total: 17150 m[IU]/mL
Interpretation-AFP: NEGATIVE
MoM for AFP: 0.84
MoM for hCG: 0.7
Open Spina bifida: NEGATIVE
Tri 18 Scr Risk Est: NEGATIVE
uE3 Mom: 0.88
uE3 Value: 0.4 ng/mL

## 2013-04-16 ENCOUNTER — Ambulatory Visit (INDEPENDENT_AMBULATORY_CARE_PROVIDER_SITE_OTHER): Payer: Medicaid Other | Admitting: Obstetrics

## 2013-04-16 ENCOUNTER — Encounter: Payer: Medicaid Other | Admitting: Obstetrics

## 2013-04-16 VITALS — BP 118/80 | Temp 98.6°F | Wt 184.0 lb

## 2013-04-16 DIAGNOSIS — M25569 Pain in unspecified knee: Secondary | ICD-10-CM

## 2013-04-16 DIAGNOSIS — Z3402 Encounter for supervision of normal first pregnancy, second trimester: Secondary | ICD-10-CM

## 2013-04-16 DIAGNOSIS — Z3400 Encounter for supervision of normal first pregnancy, unspecified trimester: Secondary | ICD-10-CM

## 2013-04-16 DIAGNOSIS — M25561 Pain in right knee: Secondary | ICD-10-CM | POA: Insufficient documentation

## 2013-04-16 LAB — POCT URINALYSIS DIPSTICK
Bilirubin, UA: NEGATIVE
Blood, UA: NEGATIVE
Glucose, UA: NEGATIVE
Ketones, UA: NEGATIVE
Leukocytes, UA: NEGATIVE
Nitrite, UA: NEGATIVE
pH, UA: 6.5

## 2013-04-16 NOTE — Progress Notes (Signed)
R knee pain and swelling.  Patient reports some sharp pain on L. P 88

## 2013-04-16 NOTE — Addendum Note (Signed)
Addended by: Glendell Docker on: 04/16/2013 10:52 AM   Modules accepted: Orders

## 2013-04-24 ENCOUNTER — Other Ambulatory Visit: Payer: Medicaid Other

## 2013-04-24 ENCOUNTER — Other Ambulatory Visit: Payer: Self-pay | Admitting: Obstetrics

## 2013-04-24 ENCOUNTER — Encounter: Payer: Self-pay | Admitting: Obstetrics & Gynecology

## 2013-04-24 ENCOUNTER — Ambulatory Visit (INDEPENDENT_AMBULATORY_CARE_PROVIDER_SITE_OTHER): Payer: Medicaid Other

## 2013-04-24 ENCOUNTER — Encounter: Payer: Self-pay | Admitting: Obstetrics

## 2013-04-24 DIAGNOSIS — Z3400 Encounter for supervision of normal first pregnancy, unspecified trimester: Secondary | ICD-10-CM

## 2013-04-24 DIAGNOSIS — Z369 Encounter for antenatal screening, unspecified: Secondary | ICD-10-CM

## 2013-04-24 LAB — US OB DETAIL + 14 WK

## 2013-04-25 ENCOUNTER — Encounter: Payer: Self-pay | Admitting: Obstetrics

## 2013-05-07 ENCOUNTER — Other Ambulatory Visit: Payer: Self-pay | Admitting: *Deleted

## 2013-05-07 DIAGNOSIS — IMO0002 Reserved for concepts with insufficient information to code with codable children: Secondary | ICD-10-CM

## 2013-05-07 DIAGNOSIS — Z0489 Encounter for examination and observation for other specified reasons: Secondary | ICD-10-CM

## 2013-05-08 ENCOUNTER — Ambulatory Visit (HOSPITAL_COMMUNITY): Payer: Medicaid Other

## 2013-05-10 ENCOUNTER — Ambulatory Visit (HOSPITAL_COMMUNITY): Admission: RE | Admit: 2013-05-10 | Payer: Medicaid Other | Source: Ambulatory Visit

## 2013-05-10 ENCOUNTER — Other Ambulatory Visit: Payer: Self-pay | Admitting: Obstetrics & Gynecology

## 2013-05-10 ENCOUNTER — Ambulatory Visit (HOSPITAL_COMMUNITY)
Admission: RE | Admit: 2013-05-10 | Discharge: 2013-05-10 | Disposition: A | Discharge status: Home / Self Care | Payer: Medicaid Other | Source: Ambulatory Visit | Attending: Obstetrics & Gynecology | Admitting: Obstetrics & Gynecology

## 2013-05-10 ENCOUNTER — Other Ambulatory Visit (HOSPITAL_COMMUNITY): Payer: Self-pay | Admitting: Obstetrics & Gynecology

## 2013-05-10 DIAGNOSIS — Z0489 Encounter for examination and observation for other specified reasons: Secondary | ICD-10-CM

## 2013-05-10 DIAGNOSIS — Z3689 Encounter for other specified antenatal screening: Secondary | ICD-10-CM | POA: Insufficient documentation

## 2013-05-14 ENCOUNTER — Ambulatory Visit (INDEPENDENT_AMBULATORY_CARE_PROVIDER_SITE_OTHER): Payer: Medicaid Other | Admitting: Obstetrics

## 2013-05-14 ENCOUNTER — Other Ambulatory Visit: Payer: Medicaid Other

## 2013-05-14 ENCOUNTER — Encounter: Payer: Self-pay | Admitting: Obstetrics

## 2013-05-14 VITALS — BP 119/80 | Temp 97.3°F | Wt 192.0 lb

## 2013-05-14 DIAGNOSIS — Z3402 Encounter for supervision of normal first pregnancy, second trimester: Secondary | ICD-10-CM

## 2013-05-14 DIAGNOSIS — R110 Nausea: Secondary | ICD-10-CM

## 2013-05-14 DIAGNOSIS — Z3400 Encounter for supervision of normal first pregnancy, unspecified trimester: Secondary | ICD-10-CM

## 2013-05-14 DIAGNOSIS — R21 Rash and other nonspecific skin eruption: Secondary | ICD-10-CM

## 2013-05-14 LAB — POCT URINALYSIS DIPSTICK
Spec Grav, UA: 1.01
pH, UA: 6

## 2013-05-14 LAB — CBC
MCH: 30.4 pg (ref 26.0–34.0)
MCHC: 33.6 g/dL (ref 30.0–36.0)
Platelets: 334 10*3/uL (ref 150–400)
RDW: 14.6 % (ref 11.5–15.5)

## 2013-05-14 MED ORDER — PROMETHAZINE HCL 25 MG PO TABS: 12.5000 mg | ORAL_TABLET | Freq: Four times a day (QID) | ORAL | Status: DC | PRN

## 2013-05-14 MED ORDER — DESONIDE 0.05 % EX CREA: TOPICAL_CREAM | Freq: Two times a day (BID) | CUTANEOUS | Status: DC

## 2013-05-14 MED ORDER — CLOTRIMAZOLE 1 % EX CREA: TOPICAL_CREAM | Freq: Two times a day (BID) | CUTANEOUS | Status: DC

## 2013-05-14 NOTE — Progress Notes (Signed)
P 84 Patient is having trouble getting comfortable at night

## 2013-05-15 LAB — HIV ANTIBODY (ROUTINE TESTING W REFLEX): HIV: NONREACTIVE

## 2013-05-15 LAB — GLUCOSE TOLERANCE, 2 HOURS W/ 1HR
Glucose, 1 hour: 149 mg/dL (ref 70–170)
Glucose, 2 hour: 96 mg/dL (ref 70–139)
Glucose, Fasting: 80 mg/dL (ref 70–99)

## 2013-05-15 LAB — RPR

## 2013-05-28 ENCOUNTER — Encounter: Payer: Medicaid Other | Admitting: Obstetrics

## 2013-06-04 ENCOUNTER — Ambulatory Visit (INDEPENDENT_AMBULATORY_CARE_PROVIDER_SITE_OTHER): Payer: Medicaid Other | Admitting: Obstetrics

## 2013-06-04 VITALS — BP 125/76 | Temp 97.8°F | Wt 197.0 lb

## 2013-06-04 DIAGNOSIS — R82998 Other abnormal findings in urine: Secondary | ICD-10-CM

## 2013-06-04 DIAGNOSIS — Z3483 Encounter for supervision of other normal pregnancy, third trimester: Secondary | ICD-10-CM

## 2013-06-04 DIAGNOSIS — Z3480 Encounter for supervision of other normal pregnancy, unspecified trimester: Secondary | ICD-10-CM

## 2013-06-04 DIAGNOSIS — R8290 Unspecified abnormal findings in urine: Secondary | ICD-10-CM

## 2013-06-04 LAB — POCT URINALYSIS DIPSTICK
Blood, UA: NEGATIVE
Ketones, UA: NEGATIVE
Spec Grav, UA: 1.015
pH, UA: 6

## 2013-06-04 NOTE — Progress Notes (Signed)
Pulse- 96 Pt states she has some edema present at the end of the day. Pt states she is having back pain

## 2013-06-05 ENCOUNTER — Encounter (HOSPITAL_COMMUNITY): Payer: Self-pay

## 2013-06-05 ENCOUNTER — Inpatient Hospital Stay (HOSPITAL_COMMUNITY)
Admission: AD | Admit: 2013-06-05 | Discharge: 2013-06-05 | Disposition: A | Discharge status: Home / Self Care | Payer: Medicaid Other | Source: Ambulatory Visit | Attending: Obstetrics | Admitting: Obstetrics

## 2013-06-05 DIAGNOSIS — K529 Noninfective gastroenteritis and colitis, unspecified: Secondary | ICD-10-CM

## 2013-06-05 DIAGNOSIS — K5289 Other specified noninfective gastroenteritis and colitis: Secondary | ICD-10-CM | POA: Insufficient documentation

## 2013-06-05 DIAGNOSIS — O212 Late vomiting of pregnancy: Secondary | ICD-10-CM | POA: Insufficient documentation

## 2013-06-05 DIAGNOSIS — R109 Unspecified abdominal pain: Secondary | ICD-10-CM | POA: Insufficient documentation

## 2013-06-05 DIAGNOSIS — O99891 Other specified diseases and conditions complicating pregnancy: Secondary | ICD-10-CM | POA: Insufficient documentation

## 2013-06-05 DIAGNOSIS — R197 Diarrhea, unspecified: Secondary | ICD-10-CM | POA: Insufficient documentation

## 2013-06-05 LAB — URINALYSIS, ROUTINE W REFLEX MICROSCOPIC
Hgb urine dipstick: NEGATIVE
Specific Gravity, Urine: >1.03 — ABNORMAL HIGH (ref 1.005–1.030)
Urobilinogen, UA: 0.2 mg/dL (ref 0.0–1.0)
pH: 6 (ref 5.0–8.0)

## 2013-06-05 MED ORDER — LACTATED RINGERS IV SOLN
INTRAVENOUS | Status: DC
  Administered 2013-06-05: 16:00:00 via INTRAVENOUS

## 2013-06-05 MED ORDER — PROMETHAZINE HCL 25 MG/ML IJ SOLN
12.5000 mg | Freq: Four times a day (QID) | INTRAMUSCULAR | Status: DC | PRN
  Administered 2013-06-05: 12.5 mg via INTRAVENOUS
  Filled 2013-06-05: qty 1

## 2013-06-05 NOTE — MAU Note (Signed)
Patient is in with c/o nausea, vomiting, diarrhea and abdominal cramping. She states that her body feels warm. She denies vaginal bleeding or lof. She reports good fetal movement.

## 2013-06-05 NOTE — MAU Provider Note (Signed)
History     CSN: [id number]  Arrival date and time: 06/05/13 1356   First Provider Initiated Contact with Patient 06/05/13 1508      Chief Complaint  Patient presents with  . Nausea  . Emesis  . Abdominal Cramping  . Diarrhea   HPI This is a 22 y.o. female at [redacted]w[redacted]d who presents with c/o nausea, vomiting and diarrhea since last night at 10pm. States feels hot.  No bleeding or leaking.  No sick contacts   RN Note: Patient is in with c/o nausea, vomiting, diarrhea and abdominal cramping. She states that her body feels warm. She denies vaginal bleeding or lof. She reports good fetal movement.      OB History   Grav Para Term Preterm Abortions TAB SAB Ect Mult Living   2    1     0      Past Medical History  Diagnosis Date  . Medical history non-contributory     Past Surgical History  Procedure Laterality Date  . Dilation and curettage of uterus      Family History  Problem Relation Age of Onset  . Hypertension Mother   . Diabetes Mother   . Diabetes Maternal Grandmother     History  Substance Use Topics  . Smoking status: Former Smoker    Quit date: 01/12/2011  . Smokeless tobacco: Not on file  . Alcohol Use: No    Allergies:  Allergies  Allergen Reactions  . Flagyl [Metronidazole] Nausea And Vomiting    Prescriptions prior to admission  Medication Sig Dispense Refill  . clotrimazole (LOTRIMIN) 1 % cream Apply topically 2 (two) times daily. Rash  30 g  2  . desonide (DESOWEN) 0.05 % cream Apply topically 2 (two) times daily.  30 g  2  . Doxylamine-Pyridoxine (DICLEGIS) 10-10 MG TBEC Take 2 tablets by mouth at bedtime. If symptoms persist, add 1 tab every morning starting Day 3 If symptoms persist, add 1 tablet every night starting Day 4  100 tablet  3  . famotidine (PEPCID) 20 MG tablet Take 20 mg by mouth 2 (two) times daily as needed.      . Prenatal Vit-Fe Fumarate-FA (PRENATAL MULTIVITAMIN) TABS Take 1 tablet by mouth daily at 12 noon.      .  promethazine (PHENERGAN) 25 MG tablet Take 0.5-1 tablets (12.5-25 mg total) by mouth every 6 (six) hours as needed for nausea.  30 tablet  1  . [DISCONTINUED] famotidine (PEPCID) 20 MG tablet Take 1 tablet (20 mg total) by mouth 2 (two) times daily.  30 tablet  0    Review of Systems  Constitutional: Positive for fever and malaise/fatigue.  Gastrointestinal: Positive for nausea, vomiting, abdominal pain (crampy pain all over at times) and diarrhea. Negative for constipation.  Genitourinary: Negative for dysuria.  Musculoskeletal: Negative for myalgias.  Neurological: Negative for headaches.   Physical Exam   Blood pressure 105/70, pulse 70, temperature 98.8 F (37.1 C), temperature source Oral, resp. rate 16, height 5\' 3"  (1.6 m), weight 87.714 kg (193 lb 6 oz), last menstrual period 11/28/2012, SpO2 99.00%.  Physical Exam  Constitutional: She is oriented to person, place, and time. She appears well-developed and well-nourished. No distress.  HENT:  Head: Normocephalic.  Cardiovascular: Normal rate.   Respiratory: Effort normal.  GI: Soft. She exhibits no distension and no mass. There is tenderness (mild diffuse tenderness throughout). There is no rebound and no guarding.  Genitourinary:  FHR reactive Some uterine irritability  Musculoskeletal: Normal range of motion.  Neurological: She is alert and oriented to person, place, and time.  Skin: Skin is warm and dry.  Psychiatric: She has a normal mood and affect.    MAU Course  Procedures  MDM Discussed with Dr Clearance Coots.  Will hydrate and medicate for nausea. >>  Feels better after treatment. Tolerating PO fluids. Has had no vomiting or diarrhea while here.   Assessment and Plan  A:  SIUP at [redacted]w[redacted]d       Viral gastroenteritis  P:  Discharge home       Has Phenergan Rx at home       BRAT diet       Follow up in office  [facility] 06/05/2013, 3:58 PM

## 2013-06-07 LAB — CULTURE, OB URINE: Colony Count: 10000

## 2013-06-10 ENCOUNTER — Encounter (HOSPITAL_COMMUNITY): Payer: Self-pay | Admitting: *Deleted

## 2013-06-10 ENCOUNTER — Inpatient Hospital Stay (HOSPITAL_COMMUNITY): Payer: Medicaid Other

## 2013-06-10 ENCOUNTER — Inpatient Hospital Stay (HOSPITAL_COMMUNITY)
Admission: AD | Admit: 2013-06-10 | Discharge: 2013-06-14 | DRG: 781 | Disposition: A | Discharge status: Home / Self Care | Payer: Medicaid Other | Source: Ambulatory Visit | Attending: Obstetrics | Admitting: Obstetrics

## 2013-06-10 DIAGNOSIS — R109 Unspecified abdominal pain: Secondary | ICD-10-CM | POA: Diagnosis present

## 2013-06-10 DIAGNOSIS — R21 Rash and other nonspecific skin eruption: Secondary | ICD-10-CM | POA: Diagnosis present

## 2013-06-10 DIAGNOSIS — K5289 Other specified noninfective gastroenteritis and colitis: Secondary | ICD-10-CM

## 2013-06-10 DIAGNOSIS — R197 Diarrhea, unspecified: Secondary | ICD-10-CM | POA: Diagnosis present

## 2013-06-10 DIAGNOSIS — R112 Nausea with vomiting, unspecified: Secondary | ICD-10-CM

## 2013-06-10 DIAGNOSIS — Z87891 Personal history of nicotine dependence: Secondary | ICD-10-CM

## 2013-06-10 DIAGNOSIS — O0001 Abdominal pregnancy with intrauterine pregnancy: Secondary | ICD-10-CM

## 2013-06-10 DIAGNOSIS — A0811 Acute gastroenteropathy due to Norwalk agent: Secondary | ICD-10-CM

## 2013-06-10 DIAGNOSIS — O99891 Other specified diseases and conditions complicating pregnancy: Principal | ICD-10-CM | POA: Diagnosis present

## 2013-06-10 DIAGNOSIS — K529 Noninfective gastroenteritis and colitis, unspecified: Secondary | ICD-10-CM

## 2013-06-10 DIAGNOSIS — O212 Late vomiting of pregnancy: Secondary | ICD-10-CM | POA: Diagnosis present

## 2013-06-10 DIAGNOSIS — O4700 False labor before 37 completed weeks of gestation, unspecified trimester: Secondary | ICD-10-CM | POA: Diagnosis present

## 2013-06-10 DIAGNOSIS — A088 Other specified intestinal infections: Secondary | ICD-10-CM | POA: Diagnosis present

## 2013-06-10 LAB — URINALYSIS, ROUTINE W REFLEX MICROSCOPIC
Bilirubin Urine: NEGATIVE
Bilirubin Urine: NEGATIVE
Glucose, UA: NEGATIVE mg/dL
Glucose, UA: NEGATIVE mg/dL
Hgb urine dipstick: NEGATIVE
Hgb urine dipstick: NEGATIVE
Ketones, ur: 15 mg/dL — AB
Ketones, ur: 15 mg/dL — AB
Leukocytes, UA: NEGATIVE
Nitrite: NEGATIVE
Protein, ur: NEGATIVE mg/dL
Specific Gravity, Urine: 1.025 (ref 1.005–1.030)
Urobilinogen, UA: 0.2 mg/dL (ref 0.0–1.0)
pH: 8 (ref 5.0–8.0)

## 2013-06-10 LAB — URINE MICROSCOPIC-ADD ON

## 2013-06-10 LAB — CBC WITH DIFFERENTIAL/PLATELET
Basophils Absolute: 0 10*3/uL (ref 0.0–0.1)
Basophils Relative: 0 % (ref 0–1)
HCT: 34.8 % — ABNORMAL LOW (ref 36.0–46.0)
Lymphocytes Relative: 19 % (ref 12–46)
MCHC: 35.1 g/dL (ref 30.0–36.0)
Monocytes Absolute: 0.8 10*3/uL (ref 0.1–1.0)
Neutro Abs: 9 10*3/uL — ABNORMAL HIGH (ref 1.7–7.7)
Platelets: 361 10*3/uL (ref 150–400)
RDW: 13.2 % (ref 11.5–15.5)
WBC: 12.4 10*3/uL — ABNORMAL HIGH (ref 4.0–10.5)

## 2013-06-10 LAB — COMPREHENSIVE METABOLIC PANEL
ALT: 24 U/L (ref 0–35)
AST: 22 U/L (ref 0–37)
Albumin: 3.2 g/dL — ABNORMAL LOW (ref 3.5–5.2)
Calcium: 9.6 mg/dL (ref 8.4–10.5)
Chloride: 101 mEq/L (ref 96–112)
Creatinine, Ser: 0.67 mg/dL (ref 0.50–1.10)
Sodium: 134 mEq/L — ABNORMAL LOW (ref 135–145)
Total Bilirubin: 0.1 mg/dL — ABNORMAL LOW (ref 0.3–1.2)

## 2013-06-10 MED ORDER — ONDANSETRON 8 MG/NS 50 ML IVPB
8.0000 mg | Freq: Four times a day (QID) | INTRAVENOUS | Status: DC
  Administered 2013-06-10 – 2013-06-13 (×12): 8 mg via INTRAVENOUS
  Filled 2013-06-10 (×15): qty 8

## 2013-06-10 MED ORDER — GI COCKTAIL ~~LOC~~
30.0000 mL | Freq: Three times a day (TID) | ORAL | Status: DC | PRN
  Administered 2013-06-10: 30 mL via ORAL
  Filled 2013-06-10: qty 30

## 2013-06-10 MED ORDER — DICYCLOMINE HCL 10 MG PO CAPS
20.0000 mg | ORAL_CAPSULE | Freq: Three times a day (TID) | ORAL | Status: DC
  Administered 2013-06-10 – 2013-06-14 (×14): 20 mg via ORAL
  Filled 2013-06-10 (×14): qty 2

## 2013-06-10 MED ORDER — ACETAMINOPHEN 325 MG PO TABS: 650.0000 mg | ORAL_TABLET | ORAL | Status: DC | PRN

## 2013-06-10 MED ORDER — PRENATAL MULTIVITAMIN CH
1.0000 | ORAL_TABLET | Freq: Every day | ORAL | Status: DC
  Administered 2013-06-13: 1 via ORAL
  Filled 2013-06-10: qty 1

## 2013-06-10 MED ORDER — DEXTROSE 5 % IN LACTATED RINGERS IV BOLUS
1000.0000 mL | Freq: Once | INTRAVENOUS | Status: AC
  Administered 2013-06-10: 1000 mL via INTRAVENOUS

## 2013-06-10 MED ORDER — PROMETHAZINE HCL 25 MG/ML IJ SOLN
Freq: Once | INTRAVENOUS | Status: AC
  Administered 2013-06-10: 03:00:00 via INTRAVENOUS
  Filled 2013-06-10: qty 1000

## 2013-06-10 MED ORDER — LACTATED RINGERS IV SOLN
INTRAVENOUS | Status: DC
  Administered 2013-06-10 – 2013-06-13 (×9): via INTRAVENOUS

## 2013-06-10 MED ORDER — DEXTROSE 5 % IN LACTATED RINGERS IV BOLUS
500.0000 mL | Freq: Once | INTRAVENOUS | Status: AC
  Administered 2013-06-10: 500 mL via INTRAVENOUS

## 2013-06-10 MED ORDER — DOCUSATE SODIUM 100 MG PO CAPS
100.0000 mg | ORAL_CAPSULE | Freq: Every day | ORAL | Status: DC
  Filled 2013-06-10: qty 1

## 2013-06-10 MED ORDER — GI COCKTAIL ~~LOC~~
30.0000 mL | Freq: Once | ORAL | Status: AC
  Administered 2013-06-10: 30 mL via ORAL
  Filled 2013-06-10: qty 30

## 2013-06-10 MED ORDER — PANTOPRAZOLE SODIUM 40 MG IV SOLR
40.0000 mg | Freq: Two times a day (BID) | INTRAVENOUS | Status: DC
  Administered 2013-06-10 – 2013-06-13 (×7): 40 mg via INTRAVENOUS
  Filled 2013-06-10 (×10): qty 40

## 2013-06-10 MED ORDER — HYDROMORPHONE HCL PF 1 MG/ML IJ SOLN: 1.0000 mg | INTRAMUSCULAR | Status: DC | PRN

## 2013-06-10 MED ORDER — CALCIUM CARBONATE ANTACID 500 MG PO CHEW: 2.0000 | CHEWABLE_TABLET | ORAL | Status: DC | PRN

## 2013-06-10 MED ORDER — ZOLPIDEM TARTRATE 5 MG PO TABS: 5.0000 mg | ORAL_TABLET | Freq: Every evening | ORAL | Status: DC | PRN

## 2013-06-10 MED ORDER — HYDROMORPHONE HCL PF 1 MG/ML IJ SOLN
1.0000 mg | INTRAMUSCULAR | Status: DC | PRN
  Administered 2013-06-10 – 2013-06-11 (×4): 1 mg via INTRAVENOUS
  Filled 2013-06-10 (×4): qty 1

## 2013-06-10 MED ORDER — DICYCLOMINE HCL 10 MG PO CAPS
20.0000 mg | ORAL_CAPSULE | Freq: Once | ORAL | Status: AC
  Administered 2013-06-10: 20 mg via ORAL
  Filled 2013-06-10: qty 2

## 2013-06-10 NOTE — Consult Note (Signed)
Reason for Consult:nausea, vomiting, diarrhea Referring Physician: [name] is an 22 y.o. female.  HPI: pt reports abd pain, nausea, vomiting and diarrhea since Tuesday.  She denies fevers or focalized abd pain.  She states she has crampy abd pain with nausea and diarrhea that is worse at night.  It does not worsen after meals and she denies any blood in her diarrhea.  Past Medical History  Diagnosis Date  . Medical history non-contributory     Past Surgical History  Procedure Laterality Date  . Dilation and curettage of uterus      Family History  Problem Relation Age of Onset  . Hypertension Mother   . Diabetes Mother   . Diabetes Maternal Grandmother     Social History:  reports that she quit smoking about 2 years ago. She has never used smokeless tobacco. She reports that she does not drink alcohol or use illicit drugs.  Allergies:  Allergies  Allergen Reactions  . Flagyl [Metronidazole] Nausea And Vomiting    Medications: I have reviewed the patient's current medications.  Results for orders placed during the hospital encounter of 06/10/13 (from the past 48 hour(s))  LIPASE, BLOOD     Status: None   Collection Time    06/10/13  1:30 AM      Result Value Range   Lipase 29  11 - 59 U/L   Comment: Performed at [facility]  URINALYSIS, ROUTINE W REFLEX MICROSCOPIC     Status: Abnormal   Collection Time    06/10/13 10:55 AM      Result Value Range   Color, Urine YELLOW  YELLOW   APPearance CLEAR  CLEAR   Specific Gravity, Urine 1.025  1.005 - 1.030   pH 8.0  5.0 - 8.0   Glucose, UA NEGATIVE  NEGATIVE mg/dL   Hgb urine dipstick NEGATIVE  NEGATIVE   Bilirubin Urine NEGATIVE  NEGATIVE   Ketones, ur 15 (*) NEGATIVE mg/dL   Protein, ur NEGATIVE  NEGATIVE mg/dL   Urobilinogen, UA 0.2  0.0 - 1.0 mg/dL   Nitrite NEGATIVE  NEGATIVE   Leukocytes, UA NEGATIVE  NEGATIVE   Comment: MICROSCOPIC NOT DONE ON URINES WITH NEGATIVE PROTEIN, BLOOD,  LEUKOCYTES, NITRITE, OR GLUCOSE <1000 mg/dL.    US Abdomen Complete  06/10/2013   CLINICAL DATA:  Abdominal pain. nausea and vomiting. Diarrhea. [redacted] weeks pregnant.  EXAM: ULTRASOUND ABDOMEN COMPLETE  COMPARISON:  None.  FINDINGS: Gallbladder  Numerous tiny gallstones noted as well as gallbladder sludge. No evidence of gallbladder wall thickening or pericholecystic fluid.  Common bile duct  Diameter: 3 mm  Liver  No focal lesion identified. Within normal limits in parenchymal echogenicity.  IVC  No abnormality visualized.  Pancreas  Visualized portion unremarkable.  Spleen  Size and appearance within normal limits.  Right Kidney  Length: 10.2 cm. Echogenicity within normal limits. No mass or hydronephrosis visualized.  Left Kidney  Length: 10.3 cm. Echogenicity within normal limits. No mass or hydronephrosis visualized.  Abdominal aorta  No aneurysm visualized.  IMPRESSION: Cholelithiasis, without sonographic signs of acute cholecystitis or biliary dilatation.  No evidence of hydronephrosis or other acute findings.   Electronically Signed   By: Myles Rosenthal M.D.   On: 06/10/2013 16:04    Review of Systems  Constitutional: Negative for fever and chills.  Respiratory: Negative for cough and shortness of breath.   Cardiovascular: Negative for chest pain.  Gastrointestinal: Positive for nausea, vomiting, abdominal pain and diarrhea. Negative  for blood in stool.  Genitourinary: Negative for dysuria, urgency and frequency.  Musculoskeletal: Negative for myalgias.   Blood pressure 103/56, pulse 86, temperature 97.9 F (36.6 C), temperature source Oral, resp. rate 18, height 5\' 3"  (1.6 m), weight 196 lb (88.905 kg), last menstrual period 11/28/2012. Physical Exam  Constitutional: She is oriented to person, place, and time. She appears well-developed and well-nourished. No distress.  HENT:  Head: Normocephalic and atraumatic.  Eyes: Conjunctivae are normal. Pupils are equal, round, and reactive to light.   Neck: Normal range of motion.  Cardiovascular: Normal rate and regular rhythm.   Respiratory: Effort normal and breath sounds normal.  GI: Soft. She exhibits no distension. There is no tenderness. There is no rebound and no guarding.  Musculoskeletal: Normal range of motion.  Neurological: She is alert and oriented to person, place, and time.  Skin: Skin is warm and dry.    Assessment/Plan: Jade Brown is a 22 y.o. F with several days of nausea, vomiting and diarrhea. Her vitals are stable and her exam is benign.  Abd US shows no cholecystitis and lab work is normal.  Agree with stool cultures.  Would try starting a bland diet and monitoring her symptoms.  Jade Brown C. 06/10/2013, 5:34 PM

## 2013-06-10 NOTE — Progress Notes (Signed)
Patient ID: Jade Brown, female   DOB: [birth year], 22 y.o.   MRN: [id number] Hospital Day: 1  S: Less pain, N/V and diarrhea.  O: Blood pressure 115/61, pulse 82, temperature 98.6 F (37 C), temperature source Oral, resp. rate 18, height 5\' 3"  (1.6 m), weight 196 lb (88.905 kg), last menstrual period 11/28/2012.   AOZ:HYQMVHQI: 150-160 bpm Toco: None ONG:EXBMWUXL: Closed Effacement (%): Thick Cervical Position: Posterior Exam by:: K.Wilson,RN  A/P- 21 y.o. admitted with:  N/V, diarrhea and severe abdominal cramping.  Stable.  ? Etiology.  Will get Hospitalist consult.  Continue supportive management.  Present on Admission:  **None**  Pregnancy Complications: N/V and Diarrhea  Preterm labor management: IV D5LR started Dating:  [redacted]w[redacted]d PNL Needed:  CBC and CMET FWB:  good PTL:  stable

## 2013-06-10 NOTE — Progress Notes (Signed)
Pt states pain is same but feels it is starting to ease

## 2013-06-10 NOTE — Progress Notes (Signed)
To room 151 via w/c. Update given to [facility] RN ROBB
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
## 2013-06-10 NOTE — MAU Provider Note (Signed)
History     CSN: [id number]  Arrival date and time: 06/10/13 0139   First Provider Initiated Contact with Patient 06/10/13 0140      Chief Complaint  Patient presents with  . Emesis During Pregnancy  . Diarrhea   HPI [name] is a 22 y.o. G2P0010 at [redacted]w[redacted]d who presents to MAU today with diarrhea and abdominal pain. The patient was seen in MAU on 06/05/13 and diagnosed with viral gastroenteritis. She was told to take previously prescribed Phenergan at home. The patient states that since then she has had progressively worse diffuse abdominal pain that she rates at 10+/10 today. She states that she has had 5+ episodes of loose stools today. She denies vaginal bleeding, LOF, contractions or fever. She denies emesis recently. She last took Phenergan last night. She reports good fetal movement.   OB History   Grav Para Term Preterm Abortions TAB SAB Ect Mult Living   2    1     0      Past Medical History  Diagnosis Date  . Medical history non-contributory     Past Surgical History  Procedure Laterality Date  . Dilation and curettage of uterus      Family History  Problem Relation Age of Onset  . Hypertension Mother   . Diabetes Mother   . Diabetes Maternal Grandmother     History  Substance Use Topics  . Smoking status: Former Smoker    Quit date: 01/12/2011  . Smokeless tobacco: Not on file  . Alcohol Use: No    Allergies:  Allergies  Allergen Reactions  . Flagyl [Metronidazole] Nausea And Vomiting    Prescriptions prior to admission  Medication Sig Dispense Refill  . clotrimazole (LOTRIMIN) 1 % cream Apply topically 2 (two) times daily. Rash  30 g  2  . desonide (DESOWEN) 0.05 % cream Apply topically 2 (two) times daily.  30 g  2  . Doxylamine-Pyridoxine (DICLEGIS) 10-10 MG TBEC Take 2 tablets by mouth at bedtime. If symptoms persist, add 1 tab every morning starting Day 3 If symptoms persist, add 1 tablet every night starting Day 4  100 tablet  3  .  famotidine (PEPCID) 20 MG tablet Take 20 mg by mouth 2 (two) times daily as needed.      . Prenatal Vit-Fe Fumarate-FA (PRENATAL MULTIVITAMIN) TABS Take 1 tablet by mouth daily at 12 noon.      . promethazine (PHENERGAN) 25 MG tablet Take 0.5-1 tablets (12.5-25 mg total) by mouth every 6 (six) hours as needed for nausea.  30 tablet  1    Review of Systems  Constitutional: Negative for fever.  Gastrointestinal: Positive for nausea and abdominal pain. Negative for vomiting and constipation.  Genitourinary:       Neg - vaginal bleeding, discharge   Physical Exam   Blood pressure 127/77, pulse 88, temperature 98.6 F (37 C), resp. rate 20, height 5\' 3"  (1.6 m), weight 196 lb (88.905 kg), last menstrual period 11/28/2012.  Physical Exam  Constitutional: She is oriented to person, place, and time. She appears well-developed and well-nourished. No distress (appears uncomformtable).  HENT:  Head: Normocephalic and atraumatic.  Cardiovascular: Normal rate, regular rhythm and normal heart sounds.   Respiratory: Effort normal and breath sounds normal. No respiratory distress.  GI: Soft. Bowel sounds are normal. She exhibits no distension and no mass. There is tenderness (moderate diffuse tenderness to palpation). There is no rebound and no guarding.  Neurological: She is  alert and oriented to person, place, and time.  Skin: Skin is warm and dry. No erythema.  Psychiatric: She has a normal mood and affect.   Results for orders placed during the hospital encounter of 06/05/13 (from the past 24 hour(s))  URINALYSIS, ROUTINE W REFLEX MICROSCOPIC     Status: Abnormal   Collection Time    06/10/13  1:00 AM      Result Value Range   Color, Urine YELLOW  YELLOW   APPearance TURBID (*) CLEAR   Specific Gravity, Urine 1.025  1.005 - 1.030   pH 7.0  5.0 - 8.0   Glucose, UA NEGATIVE  NEGATIVE mg/dL   Hgb urine dipstick NEGATIVE  NEGATIVE   Bilirubin Urine NEGATIVE  NEGATIVE   Ketones, ur 15 (*)  NEGATIVE mg/dL   Protein, ur NEGATIVE  NEGATIVE mg/dL   Urobilinogen, UA 0.2  0.0 - 1.0 mg/dL   Nitrite NEGATIVE  NEGATIVE   Leukocytes, UA TRACE (*) NEGATIVE  URINE MICROSCOPIC-ADD ON     Status: Abnormal   Collection Time    06/10/13  1:00 AM      Result Value Range   Squamous Epithelial / LPF MANY (*) RARE   WBC, UA 3-6  <3 WBC/hpf   Bacteria, UA MANY (*) RARE   Crystals CA OXALATE CRYSTALS (*) NEGATIVE   Urine-Other MUCOUS PRESENT    CBC WITH DIFFERENTIAL     Status: Abnormal   Collection Time    06/10/13  1:30 AM      Result Value Range   WBC 12.4 (*) 4.0 - 10.5 K/uL   RBC 3.92  3.87 - 5.11 MIL/uL   Hemoglobin 12.2  12.0 - 15.0 g/dL   HCT 13.0 (*) 86.5 - 78.4 %   MCV 88.8  78.0 - 100.0 fL   MCH 31.1  26.0 - 34.0 pg   MCHC 35.1  30.0 - 36.0 g/dL   RDW 69.6  29.5 - 28.4 %   Platelets 361  150 - 400 K/uL   Neutrophils Relative % 72  43 - 77 %   Neutro Abs 9.0 (*) 1.7 - 7.7 K/uL   Lymphocytes Relative 19  12 - 46 %   Lymphs Abs 2.3  0.7 - 4.0 K/uL   Monocytes Relative 6  3 - 12 %   Monocytes Absolute 0.8  0.1 - 1.0 K/uL   Eosinophils Relative 3  0 - 5 %   Eosinophils Absolute 0.3  0.0 - 0.7 K/uL   Basophils Relative 0  0 - 1 %   Basophils Absolute 0.0  0.0 - 0.1 K/uL  COMPREHENSIVE METABOLIC PANEL     Status: Abnormal   Collection Time    06/10/13  1:30 AM      Result Value Range   Sodium 134 (*) 135 - 145 mEq/L   Potassium 3.9  3.5 - 5.1 mEq/L   Chloride 101  96 - 112 mEq/L   CO2 23  19 - 32 mEq/L   Glucose, Bld 95  70 - 99 mg/dL   BUN 6  6 - 23 mg/dL   Creatinine, Ser 1.32  0.50 - 1.10 mg/dL   Calcium 9.6  8.4 - 44.0 mg/dL   Total Protein 6.7  6.0 - 8.3 g/dL   Albumin 3.2 (*) 3.5 - 5.2 g/dL   AST 22  0 - 37 U/L   ALT 24  0 - 35 U/L   Alkaline Phosphatase 119 (*) 39 - 117 U/L   Total  Bilirubin 0.1 (*) 0.3 - 1.2 mg/dL   GFR calc non Af Amer >90  >90 mL/min   GFR calc Af Amer >90  >90 mL/min   Fetal Monitoring: Baseline: 140 bpm, moderate variability, +  accelerations, no decelerations Contractions: none MAU Course  Procedures None  MDM Discussed patient with Dr. Clearance Coots. Order GI cocktail, 20 mg PO Bentyl and 25 mg IV phenergan infusion. CBC with diff, CMP, stool culture, O&P today.  IV started and prior to PO medications given patient is actively vomiting in MAU  Discussed lab results and patient status with Dr. Clearance Coots. Order additional liter of IV fluids and continue to monitor x 2 hours Discussed patient status with Dr. Clearance Coots. Admit to Antenatal. Orders given and entered in Epic for routine admission. Patient will need consult with Hospitalist in the morning Assessment and Plan  A: Gastroenteritis  P: Admit to Antenatal  Freddi Starr, PA-C  06/10/2013, 4:58 AM

## 2013-06-10 NOTE — Progress Notes (Signed)
Baby active and not tracing well. Adjusted

## 2013-06-10 NOTE — MAU Note (Signed)
Pt presented with c/o n/v/d x 5 days. Was here  On Tues Dx with gastroenteritis. Pt has not felt better. Had diarrhea x 5 today.

## 2013-06-10 NOTE — H&P (Signed)
Jade Brown is a 22 y.o. female presenting for N/V, diarrhea and abdominal pain. Maternal Medical History:  Reason for admission: Nausea.  22 yo G2 P0.  EDC 09-04-13.  Presents with N/V, diarrhea and severe abdominal pain x ~ 1 week.  Fetal activity: Perceived fetal activity is normal.   Last perceived fetal movement was within the past hour.      OB History   Grav Para Term Preterm Abortions TAB SAB Ect Mult Living   2    1     0     Past Medical History  Diagnosis Date  . Medical history non-contributory    Past Surgical History  Procedure Laterality Date  . Dilation and curettage of uterus     Family History: family history includes Diabetes in her maternal grandmother and mother; Hypertension in her mother. Social History:  reports that she quit smoking about 2 years ago. She does not have any smokeless tobacco history on file. She reports that she does not drink alcohol or use illicit drugs.   Prenatal Transfer Tool  Maternal Diabetes: No Genetic Screening: Normal Maternal Ultrasounds/Referrals: Normal Fetal Ultrasounds or other Referrals:  None Maternal Substance Abuse:  No Significant Maternal Medications:  None Significant Maternal Lab Results:  None Other Comments:  None  Review of Systems  Gastrointestinal: Positive for nausea, vomiting, abdominal pain and diarrhea.  All other systems reviewed and are negative.    Dilation: Closed Effacement (%): Thick Exam by:: K.Wilson,RN Blood pressure 127/77, pulse 88, temperature 97.7 F (36.5 C), resp. rate 18, height 5\' 3"  (1.6 m), weight 196 lb (88.905 kg), last menstrual period 11/28/2012. Maternal Exam:  Abdomen: Patient reports generalized tenderness.  Pelvis: adequate for delivery.   Cervix: Cervix evaluated by digital exam.     Physical Exam  Nursing note and vitals reviewed. Constitutional: She is oriented to person, place, and time. She appears well-developed and well-nourished.  HENT:  Head:  Normocephalic and atraumatic.  Eyes: Conjunctivae are normal. Pupils are equal, round, and reactive to light.  Neck: Normal range of motion. Neck supple.  Cardiovascular: Normal rate and regular rhythm.   Respiratory: Effort normal and breath sounds normal.  GI: Soft. There is generalized tenderness.  Neurological: She is alert and oriented to person, place, and time.  Skin: Skin is warm and dry.  Psychiatric: She has a normal mood and affect. Her behavior is normal. Judgment and thought content normal.    Prenatal labs: ABO, Rh: A/NEG/-- (07/07 1027) Antibody: NEG (07/07 1027) Rubella: 0.79 (07/07 1027) RPR: NON REAC (10/06 1333)  HBsAg: NEGATIVE (07/07 1027)  HIV: NON REACTIVE (10/06 1333)  GBS:     Assessment/Plan: 27 weeks.  Gastroenteritis.  Admit.   Jade Brown A 06/10/2013, 5:48 AM

## 2013-06-10 NOTE — Plan of Care (Signed)
Problem: Consults Goal: Birthing Suites Patient Information Press F2 to bring up selections list   Pt < [redacted] weeks EGA     

## 2013-06-10 NOTE — MAU Note (Signed)
Report called to [facility] in [facility]. ANtenatal will call when ready for pt to come to 151

## 2013-06-11 DIAGNOSIS — O0001 Abdominal pregnancy with intrauterine pregnancy: Secondary | ICD-10-CM

## 2013-06-11 DIAGNOSIS — R112 Nausea with vomiting, unspecified: Secondary | ICD-10-CM

## 2013-06-11 DIAGNOSIS — R21 Rash and other nonspecific skin eruption: Secondary | ICD-10-CM

## 2013-06-11 DIAGNOSIS — K529 Noninfective gastroenteritis and colitis, unspecified: Secondary | ICD-10-CM

## 2013-06-11 LAB — GI PATHOGEN PANEL BY PCR, STOOL
Campylobacter by PCR: NEGATIVE
Cryptosporidium by PCR: NEGATIVE
E coli (ETEC) LT/ST: NEGATIVE
E coli (STEC): NEGATIVE
Norovirus GI/GII: POSITIVE
Salmonella by PCR: NEGATIVE
Shigella by PCR: NEGATIVE

## 2013-06-11 LAB — CBC WITH DIFFERENTIAL/PLATELET
Basophils Relative: 0 % (ref 0–1)
Eosinophils Absolute: 0.3 10*3/uL (ref 0.0–0.7)
HCT: 33.4 % — ABNORMAL LOW (ref 36.0–46.0)
Hemoglobin: 11.4 g/dL — ABNORMAL LOW (ref 12.0–15.0)
Lymphs Abs: 3.1 10*3/uL (ref 0.7–4.0)
MCH: 30.4 pg (ref 26.0–34.0)
MCHC: 34.1 g/dL (ref 30.0–36.0)
MCV: 89.1 fL (ref 78.0–100.0)
Monocytes Absolute: 1.1 10*3/uL — ABNORMAL HIGH (ref 0.1–1.0)
Monocytes Relative: 8 % (ref 3–12)
RBC: 3.75 MIL/uL — ABNORMAL LOW (ref 3.87–5.11)
WBC: 13.1 10*3/uL — ABNORMAL HIGH (ref 4.0–10.5)

## 2013-06-11 LAB — URINALYSIS, ROUTINE W REFLEX MICROSCOPIC
Glucose, UA: NEGATIVE mg/dL
Ketones, ur: NEGATIVE mg/dL
Leukocytes, UA: NEGATIVE
Nitrite: NEGATIVE
Protein, ur: NEGATIVE mg/dL
Specific Gravity, Urine: 1.015 (ref 1.005–1.030)
pH: 7 (ref 5.0–8.0)

## 2013-06-11 LAB — URINE CULTURE: Colony Count: 8000

## 2013-06-11 LAB — COMPREHENSIVE METABOLIC PANEL
Albumin: 2.8 g/dL — ABNORMAL LOW (ref 3.5–5.2)
BUN: 3 mg/dL — ABNORMAL LOW (ref 6–23)
Chloride: 104 mEq/L (ref 96–112)
Creatinine, Ser: 0.62 mg/dL (ref 0.50–1.10)
GFR calc Af Amer: >90 mL/min (ref >90)
Glucose, Bld: 101 mg/dL — ABNORMAL HIGH (ref 70–99)
Total Bilirubin: 0.1 mg/dL — ABNORMAL LOW (ref 0.3–1.2)
Total Protein: 6.4 g/dL (ref 6.0–8.3)

## 2013-06-11 MED ORDER — RHO D IMMUNE GLOBULIN 1500 UNIT/2ML IJ SOLN
300.0000 ug | Freq: Once | INTRAMUSCULAR | Status: AC
  Administered 2013-06-11: 300 ug via INTRAMUSCULAR
  Filled 2013-06-11: qty 2

## 2013-06-11 NOTE — Progress Notes (Signed)
Pt off after ressurring FHR

## 2013-06-11 NOTE — Progress Notes (Signed)
[facility] PROGRESS NOTE  DESHANNON SEIDE ZOX:[id number] DOB: 21-Jun-1991 DOA: 06/10/2013 PCP: Provider Not In System  Assessment/Plan: Intractable nausea/vomiting/diarrhea -Likely viral gastroenteritis -Gradually improving -No epidemiologic history to suggest atypical/exotic infection -Continue IV fluids, antiemetics -Hepatic panel and lipase within normal limits -IV opioids when necessary pain--patient only received 3 doses of hydromorphone in the past 24 hours -Avoid loperamide for now -Followup stool studies -Clostridium difficile PCR -Urinalysis without any pyuria -CT abdomen pelvis if patient does not continue to improve Intrauterine pregnancy -per OBGYN  Family Communication:   Pt at beside Disposition Plan:   Home when medically stable      Procedures/Studies: US Abdomen Complete  06/10/2013   CLINICAL DATA:  Abdominal pain. nausea and vomiting. Diarrhea. [redacted] weeks pregnant.  EXAM: ULTRASOUND ABDOMEN COMPLETE  COMPARISON:  None.  FINDINGS: Gallbladder  Numerous tiny gallstones noted as well as gallbladder sludge. No evidence of gallbladder wall thickening or pericholecystic fluid.  Common bile duct  Diameter: 3 mm  Liver  No focal lesion identified. Within normal limits in parenchymal echogenicity.  IVC  No abnormality visualized.  Pancreas  Visualized portion unremarkable.  Spleen  Size and appearance within normal limits.  Right Kidney  Length: 10.2 cm. Echogenicity within normal limits. No mass or hydronephrosis visualized.  Left Kidney  Length: 10.3 cm. Echogenicity within normal limits. No mass or hydronephrosis visualized.  Abdominal aorta  No aneurysm visualized.  IMPRESSION: Cholelithiasis, without sonographic signs of acute cholecystitis or biliary dilatation.  No evidence of hydronephrosis or other acute findings.   Electronically Signed   By: Myles Rosenthal M.D.   On: 06/10/2013 16:04         Subjective: Patient states that she is feeling a lot better. Only 2  episodes of vomiting and diarrhea overnight. Abdominal pain is improving. Denies any fevers, chills, chest pain, shortness breath, hemoptysis, dysuria, hematochezia, melena.  Objective: Filed Vitals:   06/10/13 2019 06/10/13 2350 06/11/13 0808 06/11/13 1134  BP: 101/54 102/54 87/44 105/43  Pulse: 90 90 78 86  Temp: 97.9 F (36.6 C) 98.9 F (37.2 C) 98.1 F (36.7 C) 98.2 F (36.8 C)  TempSrc: Oral Oral Oral Oral  Resp: 18 18 18 18   Height:      Weight:       No intake or output data in the 24 hours ending 06/11/13 1204 Weight change:  Exam:   General:  Pt is alert, follows commands appropriately, not in acute distress  HEENT: No icterus, No thrush, No neck mass, [place]/AT  Cardiovascular: RRR, S1/S2, no rubs, no gallops  Respiratory: CTA bilaterally, no wheezing, no crackles, no rhonchi  Abdomen: Soft/+BS, non tender, non distended, no guarding  Extremities: No edema, No lymphangitis, No petechiae, No rashes, no synovitis  Data Reviewed: Basic Metabolic Panel:  Recent Labs Lab 06/10/13 0130 06/11/13 0548  NA 134* 136  K 3.9 3.8  CL 101 104  CO2 23 22  GLUCOSE 95 101*  BUN 6 3*  CREATININE 0.67 0.62  CALCIUM 9.6 8.8   Liver Function Tests:  Recent Labs Lab 06/10/13 0130 06/11/13 0548  AST 22 31  ALT 24 26  ALKPHOS 119* 104  BILITOT 0.1* 0.1*  PROT 6.7 6.4  ALBUMIN 3.2* 2.8*    Recent Labs Lab 06/10/13 0130  LIPASE 29   No results found for this basename: AMMONIA,  in the last 168 hours CBC:  Recent Labs Lab 06/10/13 0130 06/11/13 0548  WBC 12.4* 13.1*  NEUTROABS 9.0* 8.6*  HGB 12.2  11.4*  HCT 34.8* 33.4*  MCV 88.8 89.1  PLT 361 293   Cardiac Enzymes: No results found for this basename: CKTOTAL, CKMB, CKMBINDEX, TROPONINI,  in the last 168 hours BNP: No components found with this basename: POCBNP,  CBG: No results found for this basename: GLUCAP,  in the last 168 hours  Recent Results (from the past 240 hour(s))  CULTURE, OB URINE      Status: None   Collection Time    06/04/13  1:19 PM      Result Value Range Status   Colony Count 10,000 COLONIES/ML   Final   Organism ID, Bacteria GROUP B STREP (S.[email or web address]) ISOLATED   Final   Comment: Testing against S. agalactiae not routinely     performed due to predictability of     AMP/PEN/VAN susceptibility.     Scheduled Meds: . dicyclomine  20 mg Oral TID AC  . docusate sodium  100 mg Oral Daily  . ondansetron (ZOFRAN) IV  8 mg Intravenous Q6H  . pantoprazole (PROTONIX) IV  40 mg Intravenous Q12H  . prenatal multivitamin  1 tablet Oral Q1200   Continuous Infusions: . lactated ringers 125 mL/hr at 06/11/13 0819     Albertine Lafoy, DO  [facility] Pager [phone]  If 7PM-7AM, please contact night-coverage [email or web address] Password [email or web address] 06/11/2013, 12:04 PM   LOS: 1 day

## 2013-06-11 NOTE — Progress Notes (Signed)
Stool sample to lab

## 2013-06-11 NOTE — Progress Notes (Signed)
Pt off the monitor after reassurring FHR  

## 2013-06-11 NOTE — Progress Notes (Signed)
Patient ID: Jade Brown, female   DOB: 22-May-1991, 22 y.o.   MRN: [id number] Subjective: Interval History: has complaints N/V/D.  Objective: Vital signs in last 24 hours: Temp:  [97.9 F (36.6 C)-98.9 F (37.2 C)] 98.1 F (36.7 C) (11/03 6962) Pulse Rate:  [78-90] 78 (11/03 0808) Resp:  [16-20] 18 (11/03 0808) BP: (87-103)/(44-56) 87/44 mmHg (11/03 0808)      Abd: soft, NT, ND  Results for orders placed during the hospital encounter of 06/10/13 (from the past 24 hour(s))  URINALYSIS, ROUTINE W REFLEX MICROSCOPIC     Status: Abnormal   Collection Time    06/10/13 10:55 AM      Result Value Range   Color, Urine YELLOW  YELLOW   APPearance CLEAR  CLEAR   Specific Gravity, Urine 1.025  1.005 - 1.030   pH 8.0  5.0 - 8.0   Glucose, UA NEGATIVE  NEGATIVE mg/dL   Hgb urine dipstick NEGATIVE  NEGATIVE   Bilirubin Urine NEGATIVE  NEGATIVE   Ketones, ur 15 (*) NEGATIVE mg/dL   Protein, ur NEGATIVE  NEGATIVE mg/dL   Urobilinogen, UA 0.2  0.0 - 1.0 mg/dL   Nitrite NEGATIVE  NEGATIVE   Leukocytes, UA NEGATIVE  NEGATIVE  CBC WITH DIFFERENTIAL     Status: Abnormal   Collection Time    06/11/13  5:48 AM      Result Value Range   WBC 13.1 (*) 4.0 - 10.5 K/uL   RBC 3.75 (*) 3.87 - 5.11 MIL/uL   Hemoglobin 11.4 (*) 12.0 - 15.0 g/dL   HCT 95.2 (*) 84.1 - 32.4 %   MCV 89.1  78.0 - 100.0 fL   MCH 30.4  26.0 - 34.0 pg   MCHC 34.1  30.0 - 36.0 g/dL   RDW 40.1  02.7 - 25.3 %   Platelets 293  150 - 400 K/uL   Neutrophils Relative % 66  43 - 77 %   Neutro Abs 8.6 (*) 1.7 - 7.7 K/uL   Lymphocytes Relative 23  12 - 46 %   Lymphs Abs 3.1  0.7 - 4.0 K/uL   Monocytes Relative 8  3 - 12 %   Monocytes Absolute 1.1 (*) 0.1 - 1.0 K/uL   Eosinophils Relative 2  0 - 5 %   Eosinophils Absolute 0.3  0.0 - 0.7 K/uL   Basophils Relative 0  0 - 1 %   Basophils Absolute 0.0  0.0 - 0.1 K/uL  COMPREHENSIVE METABOLIC PANEL     Status: Abnormal   Collection Time    06/11/13  5:48 AM      Result Value  Range   Sodium 136  135 - 145 mEq/L   Potassium 3.8  3.5 - 5.1 mEq/L   Chloride 104  96 - 112 mEq/L   CO2 22  19 - 32 mEq/L   Glucose, Bld 101 (*) 70 - 99 mg/dL   BUN 3 (*) 6 - 23 mg/dL   Creatinine, Ser 6.64  0.50 - 1.10 mg/dL   Calcium 8.8  8.4 - 40.3 mg/dL   Total Protein 6.4  6.0 - 8.3 g/dL   Albumin 2.8 (*) 3.5 - 5.2 g/dL   AST 31  0 - 37 U/L   ALT 26  0 - 35 U/L   Alkaline Phosphatase 104  39 - 117 U/L   Total Bilirubin 0.1 (*) 0.3 - 1.2 mg/dL   GFR calc non Af Amer >90  >90 mL/min   GFR calc Af  Amer >90  >90 mL/min  URINALYSIS, ROUTINE W REFLEX MICROSCOPIC     Status: None   Collection Time    06/11/13  6:50 AM      Result Value Range   Color, Urine YELLOW  YELLOW   APPearance CLEAR  CLEAR   Specific Gravity, Urine 1.015  1.005 - 1.030   pH 7.0  5.0 - 8.0   Glucose, UA NEGATIVE  NEGATIVE mg/dL   Hgb urine dipstick NEGATIVE  NEGATIVE   Bilirubin Urine NEGATIVE  NEGATIVE   Ketones, ur NEGATIVE  NEGATIVE mg/dL   Protein, ur NEGATIVE  NEGATIVE mg/dL   Urobilinogen, UA 0.2  0.0 - 1.0 mg/dL   Nitrite NEGATIVE  NEGATIVE   Leukocytes, UA NEGATIVE  NEGATIVE    Studies/Results: US Abdomen Complete  06/10/2013   CLINICAL DATA:  Abdominal pain. nausea and vomiting. Diarrhea. [redacted] weeks pregnant.  EXAM: ULTRASOUND ABDOMEN COMPLETE  COMPARISON:  None.  FINDINGS: Gallbladder  Numerous tiny gallstones noted as well as gallbladder sludge. No evidence of gallbladder wall thickening or pericholecystic fluid.  Common bile duct  Diameter: 3 mm  Liver  No focal lesion identified. Within normal limits in parenchymal echogenicity.  IVC  No abnormality visualized.  Pancreas  Visualized portion unremarkable.  Spleen  Size and appearance within normal limits.  Right Kidney  Length: 10.2 cm. Echogenicity within normal limits. No mass or hydronephrosis visualized.  Left Kidney  Length: 10.3 cm. Echogenicity within normal limits. No mass or hydronephrosis visualized.  Abdominal aorta  No aneurysm  visualized.  IMPRESSION: Cholelithiasis, without sonographic signs of acute cholecystitis or biliary dilatation.  No evidence of hydronephrosis or other acute findings.   Electronically Signed   By: Myles Rosenthal M.D.   On: 06/10/2013 16:04    Scheduled Meds: . dicyclomine  20 mg Oral TID AC  . docusate sodium  100 mg Oral Daily  . ondansetron (ZOFRAN) IV  8 mg Intravenous Q6H  . pantoprazole (PROTONIX) IV  40 mg Intravenous Q12H  . prenatal multivitamin  1 tablet Oral Q1200   Continuous Infusions: . lactated ringers 125 mL/hr at 06/11/13 0819   PRN Meds:acetaminophen, calcium carbonate, gi cocktail, HYDROmorphone, zolpidem  Assessment/Plan: Principal Problem:   Nausea with vomiting Active Problems:   Rash and other nonspecific skin eruption   Abdominal pregnancy with intrauterine pregnancy-27w,5 day, EDD=1.27.15   Gastroenteritis, acute  IUP @ [redacted]w[redacted]d Clinically stable, euvolemic, electrolytes OK  -->continue supportive care -->check stool cultures   LOS: 1 day   JACKSON-MOORE,Machell Wirthlin A

## 2013-06-11 NOTE — Progress Notes (Signed)
Ur chart review completed.  

## 2013-06-12 DIAGNOSIS — A0811 Acute gastroenteropathy due to Norwalk agent: Secondary | ICD-10-CM

## 2013-06-12 LAB — RH IG WORKUP (INCLUDES ABO/RH)
Antibody Screen: NEGATIVE
Gestational Age(Wks): 27.6
Unit division: 0

## 2013-06-12 LAB — BASIC METABOLIC PANEL
CO2: 25 mEq/L (ref 19–32)
Calcium: 8.8 mg/dL (ref 8.4–10.5)
GFR calc Af Amer: >90 mL/min (ref >90)
GFR calc non Af Amer: >90 mL/min (ref >90)
Glucose, Bld: 104 mg/dL — ABNORMAL HIGH (ref 70–99)
Potassium: 3.9 mEq/L (ref 3.5–5.1)
Sodium: 137 mEq/L (ref 135–145)

## 2013-06-12 LAB — OVA AND PARASITE EXAMINATION
Ova and parasites: NONE SEEN
Special Requests: NORMAL

## 2013-06-12 LAB — CBC
HCT: 30.6 % — ABNORMAL LOW (ref 36.0–46.0)
Hemoglobin: 10.2 g/dL — ABNORMAL LOW (ref 12.0–15.0)
MCH: 30 pg (ref 26.0–34.0)
MCHC: 33.3 g/dL (ref 30.0–36.0)
Platelets: 298 10*3/uL (ref 150–400)
RBC: 3.4 MIL/uL — ABNORMAL LOW (ref 3.87–5.11)
RDW: 13.2 % (ref 11.5–15.5)
WBC: 10.7 10*3/uL — ABNORMAL HIGH (ref 4.0–10.5)

## 2013-06-12 LAB — CLOSTRIDIUM DIFFICILE BY PCR: Toxigenic C. Difficile by PCR: NEGATIVE

## 2013-06-12 NOTE — Progress Notes (Signed)
Patient ID: Jade Brown, female   DOB: 10-06-1990, 22 y.o.   MRN: [id number] Hospital Day: 3  S: Continues to have diarrhea.  Less nausea and pain.  O: Blood pressure 112/50, pulse 80, temperature 98.6 F (37 C), temperature source Oral, resp. rate 18, height 5\' 3"  (1.6 m), weight 196 lb (88.905 kg), last menstrual period 11/28/2012.   YQM:VHQIONGE: 150-160 bpm Toco: Uterine irritability XBM:[id number]: Closed Effacement (%): Thick Cervical Position: Posterior Exam by:: K.Wilson,RN  A/P- 21 y.o. admitted with: N/V, diarrhea and severe diffuse abdominal pain.  Stool culture positive for Norovirus.  Stable.  Continue supportive management.  Hospitalist consultation and co-management appreciated.  Present on Admission:  . Rash and other nonspecific skin eruption  Pregnancy Complications: None  Preterm labor management: no treatment necessary Dating:  [redacted]w[redacted]d PNL Needed:  none FWB:  good PTL:  stable Results for MARIMAR, SUBER (MRN [id number]) as of 06/12/2013 08:21  Ref. Range 06/10/2013 01:00 06/11/2013 19:05  C difficile by pcr Latest Range: NEGATIVE   NEGATIVE  OVA AND PARASITE EXAMINATION No range found Rpt   GI PATHOGEN PANEL BY PCR, STOOL No range found Rpt   Campylobacter by PCR No range found Negative   C difficile toxin A/B No range found Negative   E coli 0157 by PCR No range found Negative   E coli (ETEC) LT/ST No range found Negative   E coli (STEC) No range found Negative   Salmonella by PCR No range found Negative   Shigella by PCR No range found Negative   Norovirus G!/G2 No range found Positive   Rotavirus A by PCR No range found Negative   G lamblia by PCR No range found Negative   Cryptosporidium by PCR No range found Negative

## 2013-06-12 NOTE — Progress Notes (Signed)
Pt off the monitor after reassurring FHR  

## 2013-06-12 NOTE — Progress Notes (Signed)
[facility] PROGRESS NOTE  Jade Brown ZOX:[id number] DOB: 12-14-1990 DOA: 06/10/2013 PCP: Provider Not In System  Assessment/Plan: Intractable nausea/vomiting/diarrhea  -secondary to viral gastroenteritis--Norovirus  -Gradually improving--tolerating diet, less loose stool-->firming up -No epidemiologic history to suggest atypical/exotic infection  -Continue IV fluids, antiemetics  -Hepatic panel and lipase within normal limits  -IV opioids when necessary pain--patient only received 3 doses of hydromorphone in the past 24 hours  -Avoid loperamide -Followup stool studies--stool culture neg  -Clostridium difficile PCR--neg  -follow up ova and parasite studies but low suspicion of such infx -Urinalysis without any pyuria  -CT abdomen pelvis if patient does not continue to improve  -remain in contact isolation while in hospital -bland diet for one week Intrauterine pregnancy  -per OBGYN  Family Communication: Pt at beside  Disposition Plan: Home when medically stable  I wil sign off.  Please recall if there any additional concerns/questions.         Procedures/Studies: US Abdomen Complete  06/10/2013   CLINICAL DATA:  Abdominal pain. nausea and vomiting. Diarrhea. [redacted] weeks pregnant.  EXAM: ULTRASOUND ABDOMEN COMPLETE  COMPARISON:  None.  FINDINGS: Gallbladder  Numerous tiny gallstones noted as well as gallbladder sludge. No evidence of gallbladder wall thickening or pericholecystic fluid.  Common bile duct  Diameter: 3 mm  Liver  No focal lesion identified. Within normal limits in parenchymal echogenicity.  IVC  No abnormality visualized.  Pancreas  Visualized portion unremarkable.  Spleen  Size and appearance within normal limits.  Right Kidney  Length: 10.2 cm. Echogenicity within normal limits. No mass or hydronephrosis visualized.  Left Kidney  Length: 10.3 cm. Echogenicity within normal limits. No mass or hydronephrosis visualized.  Abdominal aorta  No aneurysm  visualized.  IMPRESSION: Cholelithiasis, without sonographic signs of acute cholecystitis or biliary dilatation.  No evidence of hydronephrosis or other acute findings.   Electronically Signed   By: Myles Rosenthal M.D.   On: 06/10/2013 16:04         Subjective: And is feeling better. She states that her stools are firming up. She only had 3 loose bowel movements last night. Abdominal pain has improved. She is tolerating her diet. Fevers, chills, chest pain, shortness of breath, hematochezia, melena, dysuria, fevers, chills.  Objective: Filed Vitals:   06/11/13 2027 06/12/13 0001 06/12/13 0821 06/12/13 1133  BP: 118/65 112/50 94/45 125/58  Pulse: 84 80 87 81  Temp: 98.4 F (36.9 C) 98.6 F (37 C) 98.4 F (36.9 C) 98.2 F (36.8 C)  TempSrc: Oral Oral Oral Oral  Resp: 18 18 18 18   Height:      Weight:       No intake or output data in the 24 hours ending 06/12/13 1515 Weight change:  Exam:   General:  Pt is alert, follows commands appropriately, not in acute distress  HEENT: No icterus, No thrush,  [place]/AT  Cardiovascular: RRR, S1/S2, no rubs, no gallops  Respiratory: CTA bilaterally, no wheezing, no crackles, no rhonchi  Abdomen: Soft/+BS, non tender, non distended, no guarding  Extremities: trade LE edema, No lymphangitis, No petechiae, No rashes, no synovitis  Data Reviewed: Basic Metabolic Panel:  Recent Labs Lab 06/10/13 0130 06/11/13 0548  NA 134* 136  K 3.9 3.8  CL 101 104  CO2 23 22  GLUCOSE 95 101*  BUN 6 3*  CREATININE 0.67 0.62  CALCIUM 9.6 8.8   Liver Function Tests:  Recent Labs Lab 06/10/13 0130 06/11/13 0548  AST 22 31  ALT 24 26  ALKPHOS 119* 104  BILITOT 0.1* 0.1*  PROT 6.7 6.4  ALBUMIN 3.2* 2.8*    Recent Labs Lab 06/10/13 0130  LIPASE 29   No results found for this basename: AMMONIA,  in the last 168 hours CBC:  Recent Labs Lab 06/10/13 0130 06/11/13 0548 06/12/13 1436  WBC 12.4* 13.1* 10.7*  NEUTROABS 9.0* 8.6*  --    HGB 12.2 11.4* 10.2*  HCT 34.8* 33.4* 30.6*  MCV 88.8 89.1 90.0  PLT 361 293 298   Cardiac Enzymes: No results found for this basename: CKTOTAL, CKMB, CKMBINDEX, TROPONINI,  in the last 168 hours BNP: No components found with this basename: POCBNP,  CBG: No results found for this basename: GLUCAP,  in the last 168 hours  Recent Results (from the past 240 hour(s))  CULTURE, OB URINE     Status: None   Collection Time    06/04/13  1:19 PM      Result Value Range Status   Colony Count 10,000 COLONIES/ML   Final   Organism ID, Bacteria GROUP B STREP (S.AGALACTIAE) ISOLATED   Final   Comment: Testing against S. agalactiae not routinely     performed due to predictability of     AMP/PEN/VAN susceptibility.  URINE CULTURE     Status: None   Collection Time    06/10/13  1:00 AM      Result Value Range Status   Specimen Description URINE, CLEAN CATCH   Final   Special Requests NONE   Final   Culture  Setup Time     Final   Value: 06/10/2013 18:13     Performed at [facility] Count     Final   Value: 8,000 COLONIES/ML     Performed at [facility]   Culture     Final   Value: INSIGNIFICANT GROWTH     Performed at [facility]   Report Status 06/11/2013 FINAL   Final  STOOL CULTURE     Status: None   Collection Time    06/10/13  1:00 AM      Result Value Range Status   Specimen Description STOOL   Final   Special Requests Normal   Final   Culture     Final   Value: NO SUSPICIOUS COLONIES, CONTINUING TO HOLD     Performed at [facility]   Report Status PENDING   Incomplete  OVA AND PARASITE EXAMINATION     Status: None   Collection Time    06/10/13  1:00 AM      Result Value Range Status   Specimen Description STOOL   Final   Special Requests Normal   Final   Ova and parasites     Final   Value: NO OVA OR PARASITES SEEN     Performed at [facility]   Report Status 06/12/2013 FINAL   Final  CLOSTRIDIUM DIFFICILE BY PCR      Status: None   Collection Time    06/11/13  7:05 PM      Result Value Range Status   C difficile by pcr NEGATIVE  NEGATIVE Final   Comment: Performed at [facility]     Scheduled Meds: . dicyclomine  20 mg Oral TID AC  . docusate sodium  100 mg Oral Daily  . ondansetron (ZOFRAN) IV  8 mg Intravenous Q6H  . pantoprazole (PROTONIX) IV  40 mg Intravenous Q12H  . prenatal multivitamin  1 tablet  Oral Q1200   Continuous Infusions: . lactated ringers 125 mL/hr at 06/12/13 1128     Lakely Elmendorf, DO  [facility] Pager [phone]  If 7PM-7AM, please contact night-coverage [email or web address] Password [email or web address] 06/12/2013, 3:15 PM   LOS: 2 days

## 2013-06-13 LAB — BASIC METABOLIC PANEL
CO2: 25 mEq/L (ref 19–32)
Calcium: 9 mg/dL (ref 8.4–10.5)
Chloride: 104 mEq/L (ref 96–112)
Glucose, Bld: 103 mg/dL — ABNORMAL HIGH (ref 70–99)
Potassium: 3.9 mEq/L (ref 3.5–5.1)
Sodium: 138 mEq/L (ref 135–145)

## 2013-06-13 MED ORDER — PANTOPRAZOLE SODIUM 40 MG PO TBEC
40.0000 mg | DELAYED_RELEASE_TABLET | Freq: Two times a day (BID) | ORAL | Status: DC
  Administered 2013-06-13 – 2013-06-14 (×2): 40 mg via ORAL
  Filled 2013-06-13 (×2): qty 1

## 2013-06-13 MED ORDER — CLOTRIMAZOLE 1 % VA CREA
1.0000 | TOPICAL_CREAM | Freq: Every day | VAGINAL | Status: DC
  Administered 2013-06-13: 1 via VAGINAL
  Filled 2013-06-13: qty 45

## 2013-06-13 MED ORDER — ONDANSETRON HCL 4 MG PO TABS: 4.0000 mg | ORAL_TABLET | Freq: Three times a day (TID) | ORAL | Status: DC | PRN

## 2013-06-13 NOTE — Progress Notes (Signed)
rn told pt of calling provider and orders; gave pt instructions on med ordered

## 2013-06-13 NOTE — Progress Notes (Signed)
Patient ID: Jade Brown, female   DOB: Sep 12, 1990, 22 y.o.   MRN: [id number] Hospital Day: 4  S: Less nausea, diarrhea and pain.  O: Blood pressure 122/61, pulse 92, temperature 98.9 F (37.2 C), temperature source Oral, resp. rate 18, height 5\' 3"  (1.6 m), weight 196 lb (88.905 kg), last menstrual period 11/28/2012.   JXB:JYNWGNFA: 150-160 bpm Toco: None OZH:YQMVHQIO: Closed Effacement (%): Thick Cervical Position: Posterior Exam by:: K.Wilson,RN  A/P- 22 y.o. admitted with:  Severe gastroenteritis.  Stool culture for Norovirus positive.  Patient's status improving daily.  Continue supportive management.  Present on Admission:  . Rash and other nonspecific skin eruption  Pregnancy Complications: infection with Norovirus  Preterm labor management: no treatment necessary Dating:  [redacted]w[redacted]d PNL Needed:  none FWB:  good PTL:  stable

## 2013-06-14 LAB — STOOL CULTURE

## 2013-06-14 MED ORDER — FLUCONAZOLE 150 MG PO TABS: 150.0000 mg | ORAL_TABLET | Freq: Every day | ORAL | Status: DC

## 2013-06-14 MED ORDER — FLUCONAZOLE 150 MG PO TABS
150.0000 mg | ORAL_TABLET | Freq: Once | ORAL | Status: AC
  Administered 2013-06-14: 150 mg via ORAL
  Filled 2013-06-14: qty 1

## 2013-06-14 NOTE — Discharge Summary (Signed)
Physician Discharge Summary  Patient ID: Jade Brown MRN: [id number] DOB/AGE: [birth year] 22 y.o.  Admit date: 06/10/2013 Discharge date: 06/14/2013  Admission Diagnoses:  Discharge Diagnoses:  Principal Problem:   Nausea with vomiting Active Problems:   Rash and other nonspecific skin eruption   Abdominal pregnancy with intrauterine pregnancy-27w,5 day, EDD=1.27.15   Gastroenteritis, acute   Enteritis due to Norovirus   Discharged Condition: good  Hospital Course: Admitted with N/V, abdominal pain and diarrhea.  Stool + for Norovirus.  Improved.  Discharged home in good condition.  Consults: Hospitalists  Significant Diagnostic Studies: labs: CBC, CMET and microbiology: stool culture  Treatments: IV hydration and analgesia: Dilaudid  Discharge Exam: Blood pressure 93/51, pulse 82, temperature 98.8 F (37.1 C), temperature source Oral, resp. rate 18, height 5\' 3"  (1.6 m), weight 199 lb 9.6 oz (90.538 kg), last menstrual period 11/28/2012. General appearance: alert and no distress Resp: clear to auscultation bilaterally Cardio: regular rate and rhythm, S1, S2 normal, no murmur, click, rub or gallop GI: normal findings: soft, non-tender  Disposition: 01-Home or Self Care  Discharge Orders   Future Appointments Provider Department Dept Phone   06/22/2013 10:30 AM Brock Bad, MD [facility] [phone]   Future Orders Complete By Expires   Discharge activity:  No Restrictions  As directed    Discharge diet:  No restrictions  As directed    Discharge instructions  As directed    Comments:     Routine   No sexual activity restrictions  As directed        Medication List         clotrimazole 1 % cream  Commonly known as:  LOTRIMIN  Apply topically 2 (two) times daily. Rash     desonide 0.05 % cream  Commonly known as:  DESOWEN  Apply topically 2 (two) times daily.     Doxylamine-Pyridoxine 10-10 MG Tbec  Commonly known as:  DICLEGIS  - Take 2  tablets by mouth at bedtime. If symptoms persist, add 1 tab every morning starting Day 3  - If symptoms persist, add 1 tablet every night starting Day 4     famotidine 20 MG tablet  Commonly known as:  PEPCID  Take 20 mg by mouth 2 (two) times daily as needed.     prenatal multivitamin Tabs tablet  Take 1 tablet by mouth daily at 12 noon.     promethazine 25 MG tablet  Commonly known as:  PHENERGAN  Take 0.5-1 tablets (12.5-25 mg total) by mouth every 6 (six) hours as needed for nausea.           Follow-up Information   Follow up with HARPER,CHARLES A, MD. Schedule an appointment as soon as possible for a visit in 2 weeks.   Specialty:  Obstetrics and Gynecology   Contact information:   [address] [place] [address] [phone]       Signed: Brock Bad 06/14/2013, 8:32 AM

## 2013-06-14 NOTE — Progress Notes (Signed)
Patient ID: Jade Brown, female   DOB: 1990-12-02, 22 y.o.   MRN: [id number] Hospital Day: 5  S: Tolerating diet.  No pain, N/V.  O: Blood pressure 93/51, pulse 82, temperature 98.8 F (37.1 C), temperature source Oral, resp. rate 18, height 5\' 3"  (1.6 m), weight 199 lb 9.6 oz (90.538 kg), last menstrual period 11/28/2012.   BJY:NWGNFAOZ: 150-160 bpm Toco: None HYQ:MVHQIONG: Closed Effacement (%): Thick Cervical Position: Posterior Exam by:: K.Wilson,RN  A/P- 22 y.o. admitted with:  Severe abdominal pain, N/V and diarrhea.  Stool positive for Norovirus.  Much improved.  Ready for discharge.  Present on Admission:  . Rash and other nonspecific skin eruption  Pregnancy Complications: Viral Gastroenteritis.  Preterm labor management: bedrest advised Dating:  [redacted]w[redacted]d PNL Needed:  none FWB:  good PTL:  stable

## 2013-06-18 ENCOUNTER — Encounter: Payer: Medicaid Other | Admitting: Obstetrics

## 2013-06-20 ENCOUNTER — Inpatient Hospital Stay (HOSPITAL_COMMUNITY)
Admission: AD | Admit: 2013-06-20 | Discharge: 2013-06-20 | Disposition: A | Discharge status: Home / Self Care | Payer: Medicaid Other | Source: Ambulatory Visit | Attending: Obstetrics & Gynecology | Admitting: Obstetrics & Gynecology

## 2013-06-20 ENCOUNTER — Encounter (HOSPITAL_COMMUNITY): Payer: Self-pay | Admitting: General Practice

## 2013-06-20 DIAGNOSIS — R109 Unspecified abdominal pain: Secondary | ICD-10-CM | POA: Insufficient documentation

## 2013-06-20 DIAGNOSIS — K8020 Calculus of gallbladder without cholecystitis without obstruction: Secondary | ICD-10-CM | POA: Insufficient documentation

## 2013-06-20 DIAGNOSIS — O9989 Other specified diseases and conditions complicating pregnancy, childbirth and the puerperium: Secondary | ICD-10-CM | POA: Insufficient documentation

## 2013-06-20 DIAGNOSIS — R197 Diarrhea, unspecified: Secondary | ICD-10-CM | POA: Insufficient documentation

## 2013-06-20 DIAGNOSIS — Z87891 Personal history of nicotine dependence: Secondary | ICD-10-CM | POA: Insufficient documentation

## 2013-06-20 LAB — CBC
MCH: 30 pg (ref 26.0–34.0)
MCV: 88.5 fL (ref 78.0–100.0)
Platelets: 339 10*3/uL (ref 150–400)
RBC: 3.83 MIL/uL — ABNORMAL LOW (ref 3.87–5.11)

## 2013-06-20 LAB — COMPREHENSIVE METABOLIC PANEL
AST: 13 U/L (ref 0–37)
Alkaline Phosphatase: 127 U/L — ABNORMAL HIGH (ref 39–117)
BUN: 7 mg/dL (ref 6–23)
CO2: 24 mEq/L (ref 19–32)
Calcium: 9.4 mg/dL (ref 8.4–10.5)
Chloride: 102 mEq/L (ref 96–112)
Creatinine, Ser: 0.7 mg/dL (ref 0.50–1.10)
GFR calc Af Amer: >90 mL/min (ref >90)
GFR calc non Af Amer: >90 mL/min (ref >90)
Potassium: 4.6 mEq/L (ref 3.5–5.1)

## 2013-06-20 LAB — URINALYSIS, ROUTINE W REFLEX MICROSCOPIC
Glucose, UA: NEGATIVE mg/dL
Hgb urine dipstick: NEGATIVE
Leukocytes, UA: NEGATIVE
Nitrite: NEGATIVE
Protein, ur: NEGATIVE mg/dL
Specific Gravity, Urine: 1.02 (ref 1.005–1.030)

## 2013-06-20 MED ORDER — DICYCLOMINE HCL 20 MG PO TABS: 20.0000 mg | ORAL_TABLET | Freq: Two times a day (BID) | ORAL | Status: DC | PRN

## 2013-06-20 MED ORDER — DICYCLOMINE HCL 20 MG PO TABS
20.0000 mg | ORAL_TABLET | Freq: Once | ORAL | Status: DC
  Filled 2013-06-20: qty 1

## 2013-06-20 MED ORDER — DICYCLOMINE HCL 10 MG PO CAPS
20.0000 mg | ORAL_CAPSULE | Freq: Once | ORAL | Status: AC
  Administered 2013-06-20: 20 mg via ORAL
  Filled 2013-06-20: qty 2

## 2013-06-20 NOTE — MAU Provider Note (Signed)
History     CSN: [id number]  Arrival date and time: 06/20/13 1249   First Provider Initiated Contact with Patient 06/20/13 1502      Chief Complaint  Patient presents with  . Emesis  . Diarrhea  . Abdominal Pain   HPI  Pt is a 22 yo G2P0010 at [redacted]w[redacted]d weeks IUP here with report of abdominal pain that started returned two days ago.  +vomiting and loose stool x one this morning.  Pt admitted on 06/10/13 and diagnosed with norovirus.  Pt also had cholelithiasis diagnosed by ultrasound with no evidence of cholecystitis.  Pt denies fever, body aches or chills.  No report of contractions or vaginal bleeding.     Past Medical History  Diagnosis Date  . Medical history non-contributory     Past Surgical History  Procedure Laterality Date  . Dilation and curettage of uterus      Family History  Problem Relation Age of Onset  . Hypertension Mother   . Diabetes Mother   . Diabetes Maternal Grandmother     History  Substance Use Topics  . Smoking status: Former Smoker    Quit date: 01/12/2011  . Smokeless tobacco: Never Used  . Alcohol Use: No    Allergies:  Allergies  Allergen Reactions  . Flagyl [Metronidazole] Nausea And Vomiting    Prescriptions prior to admission  Medication Sig Dispense Refill  . Prenatal Vit-Fe Fumarate-FA (PRENATAL MULTIVITAMIN) TABS Take 1 tablet by mouth daily at 12 noon.        Review of Systems  Constitutional: Negative for fever and chills.  Gastrointestinal: Positive for nausea, vomiting and abdominal pain. Negative for diarrhea and blood in stool.  Genitourinary: Negative for dysuria and urgency.  All other systems reviewed and are negative.   Physical Exam   Blood pressure 109/69, pulse 95, temperature 98.3 F (36.8 C), resp. rate 16, height 5\' 1"  (1.549 m), weight 87.272 kg (192 lb 6.4 oz), last menstrual period 11/28/2012, SpO2 100.00%.  Physical Exam  Constitutional: She is oriented to person, place, and time. She appears  well-developed and well-nourished. No distress.  Appears uncomfortable  HENT:  Head: Normocephalic.  Neck: Normal range of motion. Neck supple.  Cardiovascular: Normal rate, regular rhythm and normal heart sounds.   Respiratory: Effort normal and breath sounds normal.  GI: Soft. There is tenderness (RUQ).  Genitourinary: No bleeding around the vagina.  Musculoskeletal: Normal range of motion.  Neurological: She is alert and oriented to person, place, and time.  Skin: Skin is warm and dry.    MAU Course  Procedures Results for orders placed during the hospital encounter of 06/20/13 (from the past 24 hour(s))  URINALYSIS, ROUTINE W REFLEX MICROSCOPIC     Status: None   Collection Time    06/20/13  1:40 PM      Result Value Range   Color, Urine YELLOW  YELLOW   APPearance CLEAR  CLEAR   Specific Gravity, Urine 1.020  1.005 - 1.030   pH 6.5  5.0 - 8.0   Glucose, UA NEGATIVE  NEGATIVE mg/dL   Hgb urine dipstick NEGATIVE  NEGATIVE   Bilirubin Urine NEGATIVE  NEGATIVE   Ketones, ur NEGATIVE  NEGATIVE mg/dL   Protein, ur NEGATIVE  NEGATIVE mg/dL   Urobilinogen, UA 0.2  0.0 - 1.0 mg/dL   Nitrite NEGATIVE  NEGATIVE   Leukocytes, UA NEGATIVE  NEGATIVE  CBC     Status: Abnormal   Collection Time    06/20/13  4:23  PM      Result Value Range   WBC 13.7 (*) 4.0 - 10.5 K/uL   RBC 3.83 (*) 3.87 - 5.11 MIL/uL   Hemoglobin 11.5 (*) 12.0 - 15.0 g/dL   HCT 40.9 (*) 81.1 - 91.4 %   MCV 88.5  78.0 - 100.0 fL   MCH 30.0  26.0 - 34.0 pg   MCHC 33.9  30.0 - 36.0 g/dL   RDW 78.2  95.6 - 21.3 %   Platelets 339  150 - 400 K/uL  COMPREHENSIVE METABOLIC PANEL     Status: Abnormal   Collection Time    06/20/13  4:23 PM      Result Value Range   Sodium 135  135 - 145 mEq/L   Potassium 4.6  3.5 - 5.1 mEq/L   Chloride 102  96 - 112 mEq/L   CO2 24  19 - 32 mEq/L   Glucose, Bld 89  70 - 99 mg/dL   BUN 7  6 - 23 mg/dL   Creatinine, Ser 0.86  0.50 - 1.10 mg/dL   Calcium 9.4  8.4 - 57.8 mg/dL    Total Protein 6.2  6.0 - 8.3 g/dL   Albumin 3.0 (*) 3.5 - 5.2 g/dL   AST 13  0 - 37 U/L   ALT 16  0 - 35 U/L   Alkaline Phosphatase 127 (*) 39 - 117 U/L   Total Bilirubin 0.2 (*) 0.3 - 1.2 mg/dL   GFR calc non Af Amer >90  >90 mL/min   GFR calc Af Amer >90  >90 mL/min   FHR 130's, +accels, reactive Toco - none  1700 Pt reports improvement in pain after bentyl  1735 Consulted with Dr. Tamela Oddi > reviewed  HPI/exam/prior admission/exam/labs/improvement in pain > discharge to home with follow-up this week  Assessment and Plan  22 yo G2P0010 at [redacted]w[redacted]d wks IUP  Hx of  Gallstones  Plan: Discharge to home RX Bentyl 20 mg BID as needed for epigastric pain Follow-up in office this week   [facility] 06/20/2013, 3:09 PM

## 2013-06-20 NOTE — MAU Note (Signed)
Patient states she was admitted on 11-2 for Noro virus. States she started having nausea, vomiting and diarrhea yesterday with abdominal pain and back pain. Denies bleeding, leaking or discharge. Reports good fetal movement.

## 2013-06-22 ENCOUNTER — Ambulatory Visit (INDEPENDENT_AMBULATORY_CARE_PROVIDER_SITE_OTHER): Payer: Medicaid Other | Admitting: Obstetrics

## 2013-06-22 VITALS — BP 105/73 | Temp 98.3°F | Wt 191.0 lb

## 2013-06-22 DIAGNOSIS — Z3400 Encounter for supervision of normal first pregnancy, unspecified trimester: Secondary | ICD-10-CM

## 2013-06-22 DIAGNOSIS — Z3403 Encounter for supervision of normal first pregnancy, third trimester: Secondary | ICD-10-CM

## 2013-06-22 LAB — POCT URINALYSIS DIPSTICK
Glucose, UA: NEGATIVE
Ketones, UA: NEGATIVE
Leukocytes, UA: NEGATIVE
Nitrite, UA: NEGATIVE
Urobilinogen, UA: NEGATIVE

## 2013-06-22 NOTE — Progress Notes (Signed)
HR - 86 Pt in office for routine OB visit, complains of thick white discharge with odor, states she is swollen and itchy, recently took difucan 150 mg X1 PO states she did not get complete relief, recently in hospital with gallstones, concerned how she will be treated for this during pregnancy.

## 2013-07-04 ENCOUNTER — Encounter: Payer: Self-pay | Admitting: Obstetrics

## 2013-07-04 ENCOUNTER — Ambulatory Visit (INDEPENDENT_AMBULATORY_CARE_PROVIDER_SITE_OTHER): Payer: Medicaid Other | Admitting: Obstetrics

## 2013-07-04 VITALS — BP 120/77 | Wt 194.0 lb

## 2013-07-04 DIAGNOSIS — O3660X0 Maternal care for excessive fetal growth, unspecified trimester, not applicable or unspecified: Secondary | ICD-10-CM

## 2013-07-04 DIAGNOSIS — Z3403 Encounter for supervision of normal first pregnancy, third trimester: Secondary | ICD-10-CM

## 2013-07-04 DIAGNOSIS — Z3400 Encounter for supervision of normal first pregnancy, unspecified trimester: Secondary | ICD-10-CM

## 2013-07-04 DIAGNOSIS — O3663X1 Maternal care for excessive fetal growth, third trimester, fetus 1: Secondary | ICD-10-CM

## 2013-07-04 DIAGNOSIS — O219 Vomiting of pregnancy, unspecified: Secondary | ICD-10-CM

## 2013-07-04 LAB — POCT URINALYSIS DIPSTICK
Bilirubin, UA: NEGATIVE
Blood, UA: NEGATIVE
Nitrite, UA: NEGATIVE
Protein, UA: NEGATIVE
Spec Grav, UA: 1.02
Urobilinogen, UA: NEGATIVE
pH, UA: 6

## 2013-07-04 MED ORDER — PROMETHAZINE HCL 25 MG PO TABS: 25.0000 mg | ORAL_TABLET | Freq: Four times a day (QID) | ORAL | Status: DC | PRN

## 2013-07-04 NOTE — Progress Notes (Signed)
HR - 90 Pt in office for routine OB visit, states she is still having gallbladder pain, would like refills for bentyl and phenergan, wants to know if the baby is growing at proper rate, would also like to discuss getting a referal for gallbladder surgery post partum.

## 2013-07-10 ENCOUNTER — Ambulatory Visit (INDEPENDENT_AMBULATORY_CARE_PROVIDER_SITE_OTHER): Payer: Medicaid Other

## 2013-07-10 ENCOUNTER — Encounter: Payer: Self-pay | Admitting: Obstetrics

## 2013-07-10 DIAGNOSIS — O365990 Maternal care for other known or suspected poor fetal growth, unspecified trimester, not applicable or unspecified: Secondary | ICD-10-CM

## 2013-07-10 DIAGNOSIS — O3663X1 Maternal care for excessive fetal growth, third trimester, fetus 1: Secondary | ICD-10-CM

## 2013-07-10 LAB — US OB DETAIL + 14 WK

## 2013-07-17 ENCOUNTER — Ambulatory Visit (INDEPENDENT_AMBULATORY_CARE_PROVIDER_SITE_OTHER): Payer: Medicaid Other | Admitting: Advanced Practice Midwife

## 2013-07-17 VITALS — BP 118/79 | Temp 98.4°F | Wt 193.0 lb

## 2013-07-17 DIAGNOSIS — Z3400 Encounter for supervision of normal first pregnancy, unspecified trimester: Secondary | ICD-10-CM

## 2013-07-17 LAB — POCT URINALYSIS DIPSTICK
Blood, UA: NEGATIVE
Glucose, UA: NEGATIVE
Ketones, UA: NEGATIVE
Leukocytes, UA: NEGATIVE
Spec Grav, UA: 1.01
Urobilinogen, UA: NEGATIVE

## 2013-07-17 NOTE — Progress Notes (Signed)
Pulse 88 Pt states that she has been having irregular contractions for the past couple of days.  Pt is also having trouble sleeping at night.

## 2013-07-20 ENCOUNTER — Ambulatory Visit (INDEPENDENT_AMBULATORY_CARE_PROVIDER_SITE_OTHER): Payer: Medicaid Other | Admitting: Advanced Practice Midwife

## 2013-07-20 ENCOUNTER — Encounter: Payer: Self-pay | Admitting: Advanced Practice Midwife

## 2013-07-20 VITALS — BP 115/82 | Temp 98.1°F | Wt 195.0 lb

## 2013-07-20 DIAGNOSIS — Z3400 Encounter for supervision of normal first pregnancy, unspecified trimester: Secondary | ICD-10-CM

## 2013-07-20 DIAGNOSIS — Z3403 Encounter for supervision of normal first pregnancy, third trimester: Secondary | ICD-10-CM

## 2013-07-20 LAB — POCT URINALYSIS DIPSTICK
Bilirubin, UA: NEGATIVE
Glucose, UA: NEGATIVE
Ketones, UA: NEGATIVE
Leukocytes, UA: NEGATIVE
Nitrite, UA: NEGATIVE
pH, UA: 6

## 2013-07-20 NOTE — Progress Notes (Signed)
Pulse 93 Pt states that she has been having some irregular contractions for the past few days.  Pt was in office on Tuesday and had to be rescheduled.

## 2013-07-20 NOTE — Progress Notes (Signed)
Routine Obstetrical Visit  Subjective:    Jade Brown is being seen today for her routine obstetrical visit. She is at [redacted]w[redacted]d gestation.   Patient reports no complaints.   Objective:     BP 115/82  Temp(Src) 98.1 F (36.7 C)  Wt 195 lb (88.451 kg)  LMP 11/28/2012 Physical Exam  Exam    Assessment:    Pregnancy: G2P0010 Patient Active Problem List   Diagnosis Date Noted  . Enteritis due to Norovirus 06/12/2013  . Gastroenteritis, acute 06/11/2013  . Nausea with vomiting 06/10/2013  . Abdominal pregnancy with intrauterine pregnancy-27w,5 day, EDD=1.27.15 06/10/2013  . Nausea 05/14/2013  . Rash and other nonspecific skin eruption 05/14/2013  . Pain in joint of right knee 04/16/2013       Plan:     Prenatal vitamins. Problem list reviewed and updated.  Plans to [facility], took [facility] class. Follow up in 2 weeks. Discussed BCM options today, patient considering options. Reviewed plan for GBS.  80% of 15 min visit spent on counseling and coordination of care.     Konnar Ben 07/20/2013

## 2013-08-07 ENCOUNTER — Encounter: Payer: Self-pay | Admitting: Advanced Practice Midwife

## 2013-08-07 ENCOUNTER — Ambulatory Visit (INDEPENDENT_AMBULATORY_CARE_PROVIDER_SITE_OTHER): Payer: Medicaid Other | Admitting: Advanced Practice Midwife

## 2013-08-07 VITALS — BP 115/77 | Temp 98.5°F | Wt 201.0 lb

## 2013-08-07 DIAGNOSIS — Z3400 Encounter for supervision of normal first pregnancy, unspecified trimester: Secondary | ICD-10-CM

## 2013-08-07 DIAGNOSIS — L259 Unspecified contact dermatitis, unspecified cause: Secondary | ICD-10-CM

## 2013-08-07 DIAGNOSIS — L309 Dermatitis, unspecified: Secondary | ICD-10-CM

## 2013-08-07 DIAGNOSIS — Z3403 Encounter for supervision of normal first pregnancy, third trimester: Secondary | ICD-10-CM

## 2013-08-07 DIAGNOSIS — O368190 Decreased fetal movements, unspecified trimester, not applicable or unspecified: Secondary | ICD-10-CM

## 2013-08-07 LAB — POCT URINALYSIS DIPSTICK
Bilirubin, UA: NEGATIVE
Blood, UA: NEGATIVE
Ketones, UA: NEGATIVE
Nitrite, UA: NEGATIVE
Urobilinogen, UA: NEGATIVE
pH, UA: 5

## 2013-08-07 MED ORDER — DESONIDE LOT-MOISTURIZING CREA 0.05 % EX KIT: 1.0000 "application " | PACK | Freq: Two times a day (BID) | CUTANEOUS | Status: DC

## 2013-08-07 NOTE — Progress Notes (Signed)
Subjective: Jade Brown is a 22 y.o. at 36 weeks by early ultrasound  Patient denies vaginal leaking of fluid or bleeding, denies contractions.  Reports decreased fetal movement.  Desires a refill on the desonide cream.     Objective: Filed Vitals:   08/07/13 1011  BP: 115/77  Temp: 98.5 F (36.9 C)   140 FHR 36 Fundal Height Fetal Position cephalic 1/thick/high NST FHR 409, + accels, negative decels, mod variability  Assessment: Patient Active Problem List   Diagnosis Date Noted  . Enteritis due to Norovirus 06/12/2013  . Gastroenteritis, acute 06/11/2013  . Nausea with vomiting 06/10/2013  . Abdominal pregnancy with intrauterine pregnancy-27w,5 day, EDD=1.27.15 06/10/2013  . Nausea 05/14/2013  . Rash and other nonspecific skin eruption 05/14/2013  . Pain in joint of right knee 04/16/2013  Reactive NST  Plan: Patient to return to clinic in 1 week GBS today, review results next visit. Discuss birth control NV.  Reviewed warning signs in pregnancy. Patient to call with concerns PRN. Reviewed triage location.  15 min spent with patient greater than 80% spent in counseling and coordination of care.   Laine Fonner Wilson Singer CNM

## 2013-08-07 NOTE — Addendum Note (Signed)
Addended byWilson Singer, Navie Lamoreaux H on: 08/07/2013 09:41 PM   Modules accepted: Orders

## 2013-08-07 NOTE — Progress Notes (Signed)
HR - 103 Pt in office today for routine OB visit, reports decreased fetal movement states about 2-3 times an hour, having daily headaches with photo sensitivity, not always relieved with tylenol and rest.

## 2013-08-09 LAB — STREP B DNA PROBE: GBSP: POSITIVE

## 2013-08-09 NOTE — L&D Delivery Note (Signed)
Delivery Note At 1:27 AM a viable female was delivered via  (Presentation: ;  ).  APGAR: , ; weight .   Placenta status: , .  Cord:  with the following complications: .  Cord pH: not done  Anesthesia:   Episiotomy:  Lacerations:  Suture Repair: 2.0 Est. Blood Loss (mL):   Mom to postpartum.  Baby to Couplet care / Skin to Skin.  MARSHALL,BERNARD A 09/03/2013, 1:38 AM

## 2013-08-10 ENCOUNTER — Encounter: Payer: Self-pay | Admitting: Advanced Practice Midwife

## 2013-08-10 DIAGNOSIS — O99820 Streptococcus B carrier state complicating pregnancy: Secondary | ICD-10-CM | POA: Insufficient documentation

## 2013-08-12 ENCOUNTER — Inpatient Hospital Stay (HOSPITAL_COMMUNITY)
Admission: AD | Admit: 2013-08-12 | Discharge: 2013-08-12 | Disposition: A | Discharge status: Home / Self Care | Payer: Medicaid Other | Source: Ambulatory Visit | Attending: Obstetrics | Admitting: Obstetrics

## 2013-08-12 ENCOUNTER — Encounter (HOSPITAL_COMMUNITY): Payer: Self-pay | Admitting: *Deleted

## 2013-08-12 DIAGNOSIS — O4700 False labor before 37 completed weeks of gestation, unspecified trimester: Secondary | ICD-10-CM | POA: Insufficient documentation

## 2013-08-12 NOTE — MAU Note (Signed)
Pt presents complaining of contractions every 5 minutes since noon today. Denies vaginal bleeding or loss of fluid.

## 2013-08-12 NOTE — Discharge Instructions (Signed)
Fetal Movement Counts °Patient Name: __________________________________________________ Patient Due Date: ____________________ °Performing a fetal movement count is highly recommended in high-risk pregnancies, but it is good for every pregnant woman to do. Your caregiver may ask you to start counting fetal movements at 28 weeks of the pregnancy. Fetal movements often increase: °· After eating a full meal. °· After physical activity. °· After eating or drinking something sweet or cold. °· At rest. °Pay attention to when you feel the baby is most active. This will help you notice a pattern of your baby's sleep and wake cycles and what factors contribute to an increase in fetal movement. It is important to perform a fetal movement count at the same time each day when your baby is normally most active.  °HOW TO COUNT FETAL MOVEMENTS °1. Find a quiet and comfortable area to sit or lie down on your left side. Lying on your left side provides the best blood and oxygen circulation to your baby. °2. Write down the day and time on a sheet of paper or in a journal. °3. Start counting kicks, flutters, swishes, rolls, or jabs in a 2 hour period. You should feel at least 10 movements within 2 hours. °4. If you do not feel 10 movements in 2 hours, wait 2 3 hours and count again. Look for a change in the pattern or not enough counts in 2 hours. °SEEK MEDICAL CARE IF: °· You feel less than 10 counts in 2 hours, tried twice. °· There is no movement in over an hour. °· The pattern is changing or taking longer each day to reach 10 counts in 2 hours. °· You feel the baby is not moving as he or she usually does. °Date: ____________ Movements: ____________ Start time: ____________ Finish time: ____________  °Date: ____________ Movements: ____________ Start time: ____________ Finish time: ____________ °Date: ____________ Movements: ____________ Start time: ____________ Finish time: ____________ °Date: ____________ Movements: ____________  Start time: ____________ Finish time: ____________ °Date: ____________ Movements: ____________ Start time: ____________ Finish time: ____________ °Date: ____________ Movements: ____________ Start time: ____________ Finish time: ____________ °Date: ____________ Movements: ____________ Start time: ____________ Finish time: ____________ °Date: ____________ Movements: ____________ Start time: ____________ Finish time: ____________  °Date: ____________ Movements: ____________ Start time: ____________ Finish time: ____________ °Date: ____________ Movements: ____________ Start time: ____________ Finish time: ____________ °Date: ____________ Movements: ____________ Start time: ____________ Finish time: ____________ °Date: ____________ Movements: ____________ Start time: ____________ Finish time: ____________ °Date: ____________ Movements: ____________ Start time: ____________ Finish time: ____________ °Date: ____________ Movements: ____________ Start time: ____________ Finish time: ____________ °Date: ____________ Movements: ____________ Start time: ____________ Finish time: ____________  °Date: ____________ Movements: ____________ Start time: ____________ Finish time: ____________ °Date: ____________ Movements: ____________ Start time: ____________ Finish time: ____________ °Date: ____________ Movements: ____________ Start time: ____________ Finish time: ____________ °Date: ____________ Movements: ____________ Start time: ____________ Finish time: ____________ °Date: ____________ Movements: ____________ Start time: ____________ Finish time: ____________ °Date: ____________ Movements: ____________ Start time: ____________ Finish time: ____________ °Date: ____________ Movements: ____________ Start time: ____________ Finish time: ____________  °Date: ____________ Movements: ____________ Start time: ____________ Finish time: ____________ °Date: ____________ Movements: ____________ Start time: ____________ Finish time:  ____________ °Date: ____________ Movements: ____________ Start time: ____________ Finish time: ____________ °Date: ____________ Movements: ____________ Start time: ____________ Finish time: ____________ °Date: ____________ Movements: ____________ Start time: ____________ Finish time: ____________ °Date: ____________ Movements: ____________ Start time: ____________ Finish time: ____________ °Date: ____________ Movements: ____________ Start time: ____________ Finish time: ____________  °Date: ____________ Movements: ____________ Start time: ____________ Finish   time: ____________ °Date: ____________ Movements: ____________ Start time: ____________ Finish time: ____________ °Date: ____________ Movements: ____________ Start time: ____________ Finish time: ____________ °Date: ____________ Movements: ____________ Start time: ____________ Finish time: ____________ °Date: ____________ Movements: ____________ Start time: ____________ Finish time: ____________ °Date: ____________ Movements: ____________ Start time: ____________ Finish time: ____________ °Date: ____________ Movements: ____________ Start time: ____________ Finish time: ____________  °Date: ____________ Movements: ____________ Start time: ____________ Finish time: ____________ °Date: ____________ Movements: ____________ Start time: ____________ Finish time: ____________ °Date: ____________ Movements: ____________ Start time: ____________ Finish time: ____________ °Date: ____________ Movements: ____________ Start time: ____________ Finish time: ____________ °Date: ____________ Movements: ____________ Start time: ____________ Finish time: ____________ °Date: ____________ Movements: ____________ Start time: ____________ Finish time: ____________ °Date: ____________ Movements: ____________ Start time: ____________ Finish time: ____________  °Date: ____________ Movements: ____________ Start time: ____________ Finish time: ____________ °Date: ____________ Movements:  ____________ Start time: ____________ Finish time: ____________ °Date: ____________ Movements: ____________ Start time: ____________ Finish time: ____________ °Date: ____________ Movements: ____________ Start time: ____________ Finish time: ____________ °Date: ____________ Movements: ____________ Start time: ____________ Finish time: ____________ °Date: ____________ Movements: ____________ Start time: ____________ Finish time: ____________ °Date: ____________ Movements: ____________ Start time: ____________ Finish time: ____________  °Date: ____________ Movements: ____________ Start time: ____________ Finish time: ____________ °Date: ____________ Movements: ____________ Start time: ____________ Finish time: ____________ °Date: ____________ Movements: ____________ Start time: ____________ Finish time: ____________ °Date: ____________ Movements: ____________ Start time: ____________ Finish time: ____________ °Date: ____________ Movements: ____________ Start time: ____________ Finish time: ____________ °Date: ____________ Movements: ____________ Start time: ____________ Finish time: ____________ °Document Released: 08/25/2006 Document Revised: 07/12/2012 Document Reviewed: 05/22/2012 °Exit[employer]® Patient Information ©2014 [employer]. °Braxton Hicks Contractions °Pregnancy is commonly associated with contractions of the uterus throughout the pregnancy. Towards the end of pregnancy (32 to 34 weeks), these contractions (Braxton Hicks) can develop more often and may become more forceful. This is not true labor because these contractions do not result in opening (dilatation) and thinning of the cervix. They are sometimes difficult to tell apart from true labor because these contractions can be forceful and people have different pain tolerances. You should not feel embarrassed if you go to the hospital with false labor. Sometimes, the only way to tell if you are in true labor is for your caregiver to follow the changes in  the cervix. °How to tell the difference between true and false labor: °· False labor. °· The contractions of false labor are usually shorter, irregular and not as hard as those of true labor. °· They are often felt in the front of the lower abdomen and in the groin. °· They may leave with walking around or changing positions while lying down. °· They get weaker and are shorter lasting as time goes on. °· These contractions are usually irregular. °· They do not usually become progressively stronger, regular and closer together as with true labor. °· True labor. °· Contractions in true labor last 30 to 70 seconds, become very regular, usually become more intense, and increase in frequency. °· They do not go away with walking. °· The discomfort is usually felt in the top of the uterus and spreads to the lower abdomen and low back. °· True labor can be determined by your caregiver with an exam. This will show that the cervix is dilating and getting thinner. °If there are no prenatal problems or other health problems associated with the pregnancy, it is completely safe to be sent home with false labor and await the onset of true labor. °  HOME CARE INSTRUCTIONS  °· Keep up with your usual exercises and instructions. °· Take medications as directed. °· Keep your regular prenatal appointment. °· Eat and drink lightly if you think you are going into labor. °· If BH contractions are making you uncomfortable: °· Change your activity position from lying down or resting to walking/walking to resting. °· Sit and rest in a tub of warm water. °· Drink 2 to 3 glasses of water. Dehydration may cause B-H contractions. °· Do slow and deep breathing several times an hour. °SEEK IMMEDIATE MEDICAL CARE IF:  °· Your contractions continue to become stronger, more regular, and closer together. °· You have a gushing, burst or leaking of fluid from the vagina. °· An oral temperature above 102° F (38.9° C) develops. °· You have passage of  blood-tinged mucus. °· You develop vaginal bleeding. °· You develop continuous belly (abdominal) pain. °· You have low back pain that you never had before. °· You feel the baby's head pushing down causing pelvic pressure. °· The baby is not moving as much as it used to. °Document Released: 07/26/2005 Document Revised: 10/18/2011 Document Reviewed: 01/17/2009 °ExitCare® Patient Information ©2014 [employer]. ° °

## 2013-08-14 ENCOUNTER — Encounter: Payer: Self-pay | Admitting: Advanced Practice Midwife

## 2013-08-14 ENCOUNTER — Ambulatory Visit (INDEPENDENT_AMBULATORY_CARE_PROVIDER_SITE_OTHER): Payer: Medicaid Other | Admitting: Advanced Practice Midwife

## 2013-08-14 VITALS — BP 116/69 | Temp 98.5°F | Wt 207.0 lb

## 2013-08-14 DIAGNOSIS — Z3400 Encounter for supervision of normal first pregnancy, unspecified trimester: Secondary | ICD-10-CM

## 2013-08-14 DIAGNOSIS — O368131 Decreased fetal movements, third trimester, fetus 1: Secondary | ICD-10-CM

## 2013-08-14 DIAGNOSIS — Z3403 Encounter for supervision of normal first pregnancy, third trimester: Secondary | ICD-10-CM

## 2013-08-14 DIAGNOSIS — O368190 Decreased fetal movements, unspecified trimester, not applicable or unspecified: Secondary | ICD-10-CM

## 2013-08-14 LAB — POCT URINALYSIS DIPSTICK
BILIRUBIN UA: NEGATIVE
Blood, UA: NEGATIVE
Ketones, UA: NEGATIVE
NITRITE UA: NEGATIVE
Protein, UA: NEGATIVE
Spec Grav, UA: <=1.005
Urobilinogen, UA: NEGATIVE
pH, UA: 7

## 2013-08-14 NOTE — Progress Notes (Signed)
HR - 91 Pt in office today for routine OB visit, denies any concerns at this time, would like to have cervix checked, would like to discuss results of strep test

## 2013-08-14 NOTE — Progress Notes (Signed)
Subjective: Jade Brown is a 23 y.o. at 37 weeks by LMP  Patient denies vaginal leaking of fluid or bleeding, denies contractions.  Reports positive fetal movement.  Denies concerns today. Desires Vaginal Exam.   Objective: Filed Vitals:   08/14/13 1003  BP: 116/69  Temp: 98.5 F (36.9 C)   134 FHR 37 Fundal Height Fetal Position cephalic 1/thick/high  NST 135, + accel, neg decel, mod variability TOCO irritability  Assessment: Patient Active Problem List   Diagnosis Date Noted  . GBS (group B Streptococcus carrier), +RV culture, currently pregnant 08/10/2013  . Enteritis due to Norovirus 06/12/2013  . Gastroenteritis, acute 06/11/2013  . Nausea with vomiting 06/10/2013  . Abdominal pregnancy with intrauterine pregnancy-27w,5 day, EDD=1.27.15 06/10/2013  . Nausea 05/14/2013  . Rash and other nonspecific skin eruption 05/14/2013  . Pain in joint of right knee 04/16/2013   Cat 1 FHR, reactive Plan: Patient to return to clinic in 1 weeks Reviewed warning signs in pregnancy. Patient to call with concerns PRN. Reviewed triage location. Insurance did not cover cream Rx.   20 min spent with patient greater than 80% spent in counseling and coordination of care.   Jade Brown CNM

## 2013-08-21 ENCOUNTER — Ambulatory Visit (INDEPENDENT_AMBULATORY_CARE_PROVIDER_SITE_OTHER): Payer: Medicaid Other | Admitting: Advanced Practice Midwife

## 2013-08-21 ENCOUNTER — Encounter: Payer: Self-pay | Admitting: Advanced Practice Midwife

## 2013-08-21 VITALS — BP 133/87 | Temp 98.2°F | Wt 209.0 lb

## 2013-08-21 DIAGNOSIS — Z3400 Encounter for supervision of normal first pregnancy, unspecified trimester: Secondary | ICD-10-CM

## 2013-08-21 LAB — POCT URINALYSIS DIPSTICK
Bilirubin, UA: NEGATIVE
Blood, UA: NEGATIVE
GLUCOSE UA: NEGATIVE
Ketones, UA: NEGATIVE
Leukocytes, UA: NEGATIVE
NITRITE UA: NEGATIVE
Spec Grav, UA: 1.01
UROBILINOGEN UA: NEGATIVE
pH, UA: 7

## 2013-08-21 NOTE — Progress Notes (Signed)
Subjective: Jade Brown is a 23 y.o. at 38 weeks by LMP  Patient denies vaginal leaking of fluid or bleeding, denies contractions.  Reports positive fetal movment.  Denies concerns today. Ready for delivery. Desires NCB. No concerns.   Objective: Filed Vitals:   08/21/13 0928  BP: 133/87  Temp: 98.2 F (36.8 C)   140 FHR 38 Fundal Height Fetal Position cephalic  SVE 1/60/-3  Assessment: Patient Active Problem List   Diagnosis Date Noted  . GBS (group B Streptococcus carrier), +RV culture, currently pregnant 08/10/2013  . Enteritis due to Norovirus 06/12/2013  . Gastroenteritis, acute 06/11/2013  . Nausea with vomiting 06/10/2013  . Abdominal pregnancy with intrauterine pregnancy-27w,5 day, EDD=1.27.15 06/10/2013  . Nausea 05/14/2013  . Rash and other nonspecific skin eruption 05/14/2013  . Pain in joint of right knee 04/16/2013    Plan: Patient to return to clinic in 1 weeks Discussed plan for delivery.  Review BCM NV Reviewed warning signs in pregnancy. Patient to call with concerns PRN. Reviewed triage location.  20 min spent with patient greater than 80% spent in counseling and coordination of care.   Lenord Fralix Wilson SingerWren CNM

## 2013-08-21 NOTE — Progress Notes (Signed)
HR - 81 Pt in office today for routine OB visit and NST, denies any concerns at this time

## 2013-08-24 ENCOUNTER — Encounter: Payer: Self-pay | Admitting: Obstetrics

## 2013-08-28 ENCOUNTER — Ambulatory Visit (INDEPENDENT_AMBULATORY_CARE_PROVIDER_SITE_OTHER): Payer: Medicaid Other | Admitting: Advanced Practice Midwife

## 2013-08-28 VITALS — BP 128/65 | Temp 97.0°F | Wt 214.0 lb

## 2013-08-28 DIAGNOSIS — Z3480 Encounter for supervision of other normal pregnancy, unspecified trimester: Secondary | ICD-10-CM

## 2013-08-28 LAB — POCT URINALYSIS DIPSTICK
Bilirubin, UA: NEGATIVE
Blood, UA: NEGATIVE
Glucose, UA: NEGATIVE
Ketones, UA: NEGATIVE
LEUKOCYTES UA: NEGATIVE
Nitrite, UA: NEGATIVE
PROTEIN UA: NEGATIVE
SPEC GRAV UA: 1.01
Urobilinogen, UA: NEGATIVE
pH, UA: 6

## 2013-08-28 NOTE — Progress Notes (Signed)
Routine Obstetrical Visit  Subjective:    Jade Brown is being seen today for her routine obstetrical visit. She is at 7770w0d gestation.   Patient reports no complaints.   Objective:     BP 128/65  Temp(Src) 97 F (36.1 C)  Wt 214 lb (97.07 kg)  LMP 11/28/2012 Physical Exam  Exam    Assessment:    Pregnancy: G2P0010 Patient Active Problem List   Diagnosis Date Noted  . GBS (group B Streptococcus carrier), +RV culture, currently pregnant 08/10/2013  . Enteritis due to Norovirus 06/12/2013  . Gastroenteritis, acute 06/11/2013  . Nausea with vomiting 06/10/2013  . Abdominal pregnancy with intrauterine pregnancy-27w,5 day, EDD=1.27.15 06/10/2013  . Nausea 05/14/2013  . Rash and other nonspecific skin eruption 05/14/2013  . Pain in joint of right knee 04/16/2013       Plan:     Prenatal vitamins. Gave Pediatrician list. Gave Tdap info. Problem list reviewed and updated.  Evaluate for need of NST NV. Follow up in 1 weeks. Give Nexplanon NV. 80% of 20 min visit spent on counseling and coordination of care.     Oluwadara Gorman 08/28/2013

## 2013-08-28 NOTE — Progress Notes (Signed)
Pulse: 90  Patient states she would like information about pediatrician and would like to have her cervix checked.

## 2013-09-02 ENCOUNTER — Encounter (HOSPITAL_COMMUNITY): Payer: Self-pay | Admitting: *Deleted

## 2013-09-02 ENCOUNTER — Inpatient Hospital Stay (HOSPITAL_COMMUNITY): Payer: Medicaid Other | Admitting: Anesthesiology

## 2013-09-02 ENCOUNTER — Inpatient Hospital Stay (HOSPITAL_COMMUNITY)
Admission: AD | Admit: 2013-09-02 | Discharge: 2013-09-02 | Disposition: A | Discharge status: Home / Self Care | Payer: Medicaid Other | Source: Ambulatory Visit | Attending: Obstetrics | Admitting: Obstetrics

## 2013-09-02 ENCOUNTER — Encounter (HOSPITAL_COMMUNITY): Payer: Self-pay | Admitting: Family

## 2013-09-02 ENCOUNTER — Encounter (HOSPITAL_COMMUNITY): Payer: Medicaid Other | Admitting: Anesthesiology

## 2013-09-02 ENCOUNTER — Inpatient Hospital Stay (HOSPITAL_COMMUNITY)
Admission: AD | Admit: 2013-09-02 | Discharge: 2013-09-05 | DRG: 775 | Disposition: A | Discharge status: Home / Self Care | Payer: Medicaid Other | Source: Ambulatory Visit | Attending: Obstetrics | Admitting: Obstetrics

## 2013-09-02 HISTORY — DX: Calculus of gallbladder without cholecystitis without obstruction: K80.20

## 2013-09-02 LAB — CBC
HEMATOCRIT: 36.8 % (ref 36.0–46.0)
HEMOGLOBIN: 12.8 g/dL (ref 12.0–15.0)
MCH: 29.8 pg (ref 26.0–34.0)
MCHC: 34.8 g/dL (ref 30.0–36.0)
MCV: 85.6 fL (ref 78.0–100.0)
Platelets: 259 10*3/uL (ref 150–400)
RBC: 4.3 MIL/uL (ref 3.87–5.11)
RDW: 15.1 % (ref 11.5–15.5)
WBC: 19.2 10*3/uL — AB (ref 4.0–10.5)

## 2013-09-02 MED ORDER — DIPHENHYDRAMINE HCL 50 MG/ML IJ SOLN: 12.5000 mg | INTRAMUSCULAR | Status: DC | PRN

## 2013-09-02 MED ORDER — LACTATED RINGERS IV SOLN: 500.0000 mL | INTRAVENOUS | Status: DC | PRN

## 2013-09-02 MED ORDER — FENTANYL 2.5 MCG/ML BUPIVACAINE 1/10 % EPIDURAL INFUSION (WH - ANES)
14.0000 mL/h | INTRAMUSCULAR | Status: DC | PRN
  Filled 2013-09-02: qty 125

## 2013-09-02 MED ORDER — EPHEDRINE 5 MG/ML INJ
10.0000 mg | INTRAVENOUS | Status: DC | PRN
  Filled 2013-09-02: qty 2
  Filled 2013-09-02: qty 4

## 2013-09-02 MED ORDER — ACETAMINOPHEN 325 MG PO TABS: 650.0000 mg | ORAL_TABLET | ORAL | Status: DC | PRN

## 2013-09-02 MED ORDER — ONDANSETRON HCL 4 MG/2ML IJ SOLN
4.0000 mg | Freq: Four times a day (QID) | INTRAMUSCULAR | Status: DC | PRN
Start: 2013-09-02 — End: 2013-09-03

## 2013-09-02 MED ORDER — EPHEDRINE 5 MG/ML INJ
10.0000 mg | INTRAVENOUS | Status: DC | PRN
  Filled 2013-09-02: qty 2

## 2013-09-02 MED ORDER — PHENYLEPHRINE 40 MCG/ML (10ML) SYRINGE FOR IV PUSH (FOR BLOOD PRESSURE SUPPORT)
80.0000 ug | PREFILLED_SYRINGE | INTRAVENOUS | Status: DC | PRN
  Filled 2013-09-02: qty 2

## 2013-09-02 MED ORDER — PHENYLEPHRINE 40 MCG/ML (10ML) SYRINGE FOR IV PUSH (FOR BLOOD PRESSURE SUPPORT)
80.0000 ug | PREFILLED_SYRINGE | INTRAVENOUS | Status: DC | PRN
  Filled 2013-09-02: qty 2
  Filled 2013-09-02: qty 10

## 2013-09-02 MED ORDER — ZOLPIDEM TARTRATE 5 MG PO TABS
5.0000 mg | ORAL_TABLET | Freq: Once | ORAL | Status: AC
Start: 2013-09-02 — End: 2013-09-02
  Administered 2013-09-02: 5 mg via ORAL
  Filled 2013-09-02: qty 1

## 2013-09-02 MED ORDER — FENTANYL 2.5 MCG/ML BUPIVACAINE 1/10 % EPIDURAL INFUSION (WH - ANES)
INTRAMUSCULAR | Status: DC | PRN
Start: 2013-09-02 — End: 2013-09-03
  Administered 2013-09-02: 14 mL/h via EPIDURAL

## 2013-09-02 MED ORDER — LIDOCAINE HCL (PF) 1 % IJ SOLN
30.0000 mL | INTRAMUSCULAR | Status: DC | PRN
  Filled 2013-09-02 (×3): qty 30

## 2013-09-02 MED ORDER — FLEET ENEMA 7-19 GM/118ML RE ENEM: 1.0000 | ENEMA | RECTAL | Status: DC | PRN

## 2013-09-02 MED ORDER — CITRIC ACID-SODIUM CITRATE 334-500 MG/5ML PO SOLN: 30.0000 mL | ORAL | Status: DC | PRN

## 2013-09-02 MED ORDER — OXYTOCIN 40 UNITS IN LACTATED RINGERS INFUSION - SIMPLE MED
62.5000 mL/h | INTRAVENOUS | Status: DC
  Filled 2013-09-02: qty 1000

## 2013-09-02 MED ORDER — IBUPROFEN 600 MG PO TABS: 600.0000 mg | ORAL_TABLET | Freq: Four times a day (QID) | ORAL | Status: DC | PRN

## 2013-09-02 MED ORDER — DEXTROSE 5 % IV SOLN
2.5000 10*6.[IU] | INTRAVENOUS | Status: DC
  Filled 2013-09-02 (×4): qty 2.5

## 2013-09-02 MED ORDER — LACTATED RINGERS IV SOLN: 500.0000 mL | Freq: Once | INTRAVENOUS | Status: DC

## 2013-09-02 MED ORDER — OXYCODONE-ACETAMINOPHEN 5-325 MG PO TABS: 1.0000 | ORAL_TABLET | ORAL | Status: DC | PRN

## 2013-09-02 MED ORDER — LACTATED RINGERS IV SOLN
INTRAVENOUS | Status: DC
  Administered 2013-09-02: 22:00:00 via INTRAVENOUS

## 2013-09-02 MED ORDER — BUTORPHANOL TARTRATE 1 MG/ML IJ SOLN: 1.0000 mg | INTRAMUSCULAR | Status: DC | PRN

## 2013-09-02 MED ORDER — LIDOCAINE HCL (PF) 1 % IJ SOLN
INTRAMUSCULAR | Status: DC | PRN
  Administered 2013-09-02: 99 mL
  Administered 2013-09-02: 9 mL

## 2013-09-02 MED ORDER — PENICILLIN G POTASSIUM 5000000 UNITS IJ SOLR
5.0000 10*6.[IU] | Freq: Once | INTRAVENOUS | Status: AC
  Administered 2013-09-02: 5 10*6.[IU] via INTRAVENOUS
  Filled 2013-09-02: qty 5

## 2013-09-02 MED ORDER — OXYTOCIN BOLUS FROM INFUSION
500.0000 mL | INTRAVENOUS | Status: DC
  Administered 2013-09-03: 500 mL via INTRAVENOUS

## 2013-09-02 NOTE — Progress Notes (Signed)
Dr Gaynell FaceMarshall notified of pt's admission and status. Aware of initial sve and repeat sve with cervix unchanged and baby reactive. Will d/c home

## 2013-09-02 NOTE — Anesthesia Procedure Notes (Signed)
Epidural Patient location during procedure: OB Start time: 09/02/2013 11:05 PM End time: 09/02/2013 11:09 PM  Staffing Anesthesiologist: Leilani AbleHATCHETT, Dula Havlik Performed by: anesthesiologist   Preanesthetic Checklist Completed: patient identified, surgical consent, pre-op evaluation, timeout performed, IV checked, risks and benefits discussed and monitors and equipment checked  Epidural Patient position: sitting Prep: site prepped and draped and DuraPrep Patient monitoring: continuous pulse ox and blood pressure Approach: midline Injection technique: LOR air  Needle:  Needle type: Tuohy  Needle gauge: 17 G Needle length: 9 cm and 9 Needle insertion depth: 7 cm Catheter type: closed end flexible Catheter size: 19 Gauge Catheter at skin depth: 12 cm Test dose: negative and Other  Assessment Sensory level: T9 Events: blood not aspirated, injection not painful, no injection resistance, negative IV test and no paresthesia  Additional Notes Reason for block:procedure for pain

## 2013-09-02 NOTE — Anesthesia Preprocedure Evaluation (Signed)
Anesthesia Evaluation  Patient identified by MRN, date of birth, ID band Patient awake    Reviewed: Allergy & Precautions, H&P , NPO status , Patient's Chart, lab work & pertinent test results  Airway Mallampati: II TM Distance: >3 FB Neck ROM: full    Dental no notable dental hx.    Pulmonary former smoker,    Pulmonary exam normal       Cardiovascular negative cardio ROS      Neuro/Psych negative neurological ROS  negative psych ROS   GI/Hepatic negative GI ROS, Neg liver ROS,   Endo/Other  Morbid obesity  Renal/GU negative Renal ROS  negative genitourinary   Musculoskeletal negative musculoskeletal ROS (+)   Abdominal (+) + obese,   Peds  Hematology negative hematology ROS (+)   Anesthesia Other Findings   Reproductive/Obstetrics (+) Pregnancy                           Anesthesia Physical Anesthesia Plan  ASA: III  Anesthesia Plan: Epidural   Post-op Pain Management:    Induction:   Airway Management Planned:   Additional Equipment:   Intra-op Plan:   Post-operative Plan:   Informed Consent: I have reviewed the patients History and Physical, chart, labs and discussed the procedure including the risks, benefits and alternatives for the proposed anesthesia with the patient or authorized representative who has indicated his/her understanding and acceptance.     Plan Discussed with:   Anesthesia Plan Comments:         Anesthesia Quick Evaluation

## 2013-09-02 NOTE — MAU Note (Signed)
Pt presents with complaints of contractions that are 4 mins apart. States she was evaluated in MAU last night and was sent home.

## 2013-09-02 NOTE — Discharge Instructions (Signed)

## 2013-09-02 NOTE — Progress Notes (Signed)
Written and verbal d/c instructions given and understanding voiced. 

## 2013-09-02 NOTE — MAU Note (Signed)
Contractions since 1200 Sat. No bleeding or leaking fld

## 2013-09-03 ENCOUNTER — Encounter (HOSPITAL_COMMUNITY): Payer: Self-pay | Admitting: *Deleted

## 2013-09-03 LAB — CBC
HCT: 35.1 % — ABNORMAL LOW (ref 36.0–46.0)
Hemoglobin: 11.9 g/dL — ABNORMAL LOW (ref 12.0–15.0)
MCH: 29.3 pg (ref 26.0–34.0)
MCHC: 33.9 g/dL (ref 30.0–36.0)
MCV: 86.5 fL (ref 78.0–100.0)
Platelets: 247 10*3/uL (ref 150–400)
RBC: 4.06 MIL/uL (ref 3.87–5.11)
RDW: 15.2 % (ref 11.5–15.5)
WBC: 23.7 10*3/uL — ABNORMAL HIGH (ref 4.0–10.5)

## 2013-09-03 LAB — RPR: RPR Ser Ql: NONREACTIVE

## 2013-09-03 MED ORDER — ONDANSETRON HCL 4 MG/2ML IJ SOLN: 4.0000 mg | INTRAMUSCULAR | Status: DC | PRN

## 2013-09-03 MED ORDER — WITCH HAZEL-GLYCERIN EX PADS
1.0000 | MEDICATED_PAD | CUTANEOUS | Status: DC | PRN
Start: 2013-09-03 — End: 2013-09-05

## 2013-09-03 MED ORDER — DIPHENHYDRAMINE HCL 25 MG PO CAPS: 25.0000 mg | ORAL_CAPSULE | Freq: Four times a day (QID) | ORAL | Status: DC | PRN

## 2013-09-03 MED ORDER — IBUPROFEN 600 MG PO TABS
600.0000 mg | ORAL_TABLET | Freq: Four times a day (QID) | ORAL | Status: DC
  Administered 2013-09-03 – 2013-09-05 (×10): 600 mg via ORAL
  Filled 2013-09-03 (×10): qty 1

## 2013-09-03 MED ORDER — ONDANSETRON HCL 4 MG PO TABS: 4.0000 mg | ORAL_TABLET | ORAL | Status: DC | PRN

## 2013-09-03 MED ORDER — SENNOSIDES-DOCUSATE SODIUM 8.6-50 MG PO TABS
2.0000 | ORAL_TABLET | ORAL | Status: DC
  Filled 2013-09-03 (×2): qty 2

## 2013-09-03 MED ORDER — ZOLPIDEM TARTRATE 5 MG PO TABS: 5.0000 mg | ORAL_TABLET | Freq: Every evening | ORAL | Status: DC | PRN

## 2013-09-03 MED ORDER — BENZOCAINE-MENTHOL 20-0.5 % EX AERO: 1.0000 "application " | INHALATION_SPRAY | CUTANEOUS | Status: DC | PRN

## 2013-09-03 MED ORDER — SIMETHICONE 80 MG PO CHEW: 80.0000 mg | CHEWABLE_TABLET | ORAL | Status: DC | PRN

## 2013-09-03 MED ORDER — PRENATAL MULTIVITAMIN CH
1.0000 | ORAL_TABLET | Freq: Every day | ORAL | Status: DC
  Administered 2013-09-03 – 2013-09-05 (×3): 1 via ORAL
  Filled 2013-09-03 (×3): qty 1

## 2013-09-03 MED ORDER — TETANUS-DIPHTH-ACELL PERTUSSIS 5-2.5-18.5 LF-MCG/0.5 IM SUSP
0.5000 mL | Freq: Once | INTRAMUSCULAR | Status: AC
  Administered 2013-09-03: 0.5 mL via INTRAMUSCULAR
  Filled 2013-09-03: qty 0.5

## 2013-09-03 MED ORDER — FERROUS SULFATE 325 (65 FE) MG PO TABS
325.0000 mg | ORAL_TABLET | Freq: Two times a day (BID) | ORAL | Status: DC
  Administered 2013-09-03 – 2013-09-05 (×5): 325 mg via ORAL
  Filled 2013-09-03 (×5): qty 1

## 2013-09-03 MED ORDER — LANOLIN HYDROUS EX OINT: TOPICAL_OINTMENT | CUTANEOUS | Status: DC | PRN

## 2013-09-03 MED ORDER — DIBUCAINE 1 % RE OINT: 1.0000 "application " | TOPICAL_OINTMENT | RECTAL | Status: DC | PRN

## 2013-09-03 MED ORDER — OXYCODONE-ACETAMINOPHEN 5-325 MG PO TABS
1.0000 | ORAL_TABLET | ORAL | Status: DC | PRN
  Administered 2013-09-03 – 2013-09-04 (×5): 1 via ORAL
  Filled 2013-09-03 (×5): qty 1

## 2013-09-03 NOTE — Lactation Note (Signed)
This note was copied from the chart of Jade Julienne Hildenbrand. Lactation Consultation Note Initial consultation; first time mother; baby now 10 hours old, sleeping in bassinet. Mom states she wants to exclusively breast feed, [facility]; mom is very knowledgeable about breastfeeding.  Mom states breast feeding is going well, and she has no concerns, denies pain, states her colostrum is easily expressed with hand expression. Reviewed baby and me book br feeding basics, reviewed lactation brochure, community resources, and BFSG. Enc mom to call for help with feeding if needed.  Patient Name: Jade Brown [id number]'s Date: 09/03/2013 Reason for consult: Initial assessment   Maternal Data Formula Feeding for Exclusion: No Has patient been taught Hand Expression?: Yes Does the patient have breastfeeding experience prior to this delivery?: No  Feeding Feeding Type: Breast Fed Length of feed: 10 min  LATCH Score/Interventions Latch: Repeated attempts needed to sustain latch, nipple held in mouth throughout feeding, stimulation needed to elicit sucking reflex. Intervention(s): Adjust position  Audible Swallowing: A few with stimulation Intervention(s): Skin to skin  Type of Nipple: Everted at rest and after stimulation  Comfort (Breast/Nipple): Soft / non-tender     Hold (Positioning): Assistance needed to correctly position infant at breast and maintain latch. Intervention(s): Support Pillows;Position options  LATCH Score: 7  Lactation Tools Discussed/Used     Consult Status Consult Status: PRN    Lenard ForthSanders, Vanette Noguchi Fulmer 09/03/2013, 12:02 PM

## 2013-09-03 NOTE — Anesthesia Postprocedure Evaluation (Signed)
Anesthesia Post Note  Patient: Jade Brown  Procedure(s) Performed: * No procedures listed *  Anesthesia type: Epidural  Patient location: Mother/Baby  Post pain: Pain level controlled  Post assessment: Post-op Vital signs reviewed  Last Vitals:  Filed Vitals:   09/03/13 0500  BP: 146/84  Pulse: 106  Temp: 37.2 C  Resp: 20    Post vital signs: Reviewed  Level of consciousness: awake  Complications: No apparent anesthesia complications

## 2013-09-03 NOTE — Progress Notes (Signed)
Ur chart review completed.  

## 2013-09-04 ENCOUNTER — Encounter: Payer: Medicaid Other | Admitting: Advanced Practice Midwife

## 2013-09-04 NOTE — Progress Notes (Signed)
Post Partum Day 1 Subjective: no complaints, up ad lib, voiding, tolerating PO and + flatus Newborn has not yet voided in life.   Objective: Blood pressure 122/82, pulse 83, temperature 97.9 F (36.6 C), temperature source Oral, resp. rate 20, last menstrual period 11/28/2012, SpO2 100.00%, unknown if currently breastfeeding.  Physical Exam:  General: alert and cooperative Lochia: appropriate Uterine Fundus: firm Incision: NA DVT Evaluation: No evidence of DVT seen on physical exam.   Recent Labs  09/02/13 2215 09/03/13 0615  HGB 12.8 11.9*  HCT 36.8 35.1*    Assessment/Plan: Plan for discharge tomorrow, Breastfeeding and Contraception Nexplanon Plan D/C home tomorrow.  Reviewed PP education today.    LOS: 2 days   [facility] Hospital[facility]EN, Kristopher Attwood 09/04/2013, 9:25 AM

## 2013-09-05 MED ORDER — MEASLES, MUMPS & RUBELLA VAC ~~LOC~~ INJ
0.5000 mL | INJECTION | Freq: Once | SUBCUTANEOUS | Status: AC
  Administered 2013-09-05: 0.5 mL via SUBCUTANEOUS
  Filled 2013-09-05: qty 0.5

## 2013-09-05 MED ORDER — IBUPROFEN 600 MG PO TABS: 600.0000 mg | ORAL_TABLET | Freq: Four times a day (QID) | ORAL | Status: DC | PRN

## 2013-09-05 MED ORDER — OXYCODONE-ACETAMINOPHEN 5-325 MG PO TABS: 1.0000 | ORAL_TABLET | ORAL | Status: DC | PRN

## 2013-09-05 NOTE — Progress Notes (Signed)
Post Partum Day 2 Subjective: no complaints  Objective: Blood pressure 128/87, pulse 85, temperature 98.6 F (37 C), temperature source Oral, resp. rate 20, last menstrual period 11/28/2012, SpO2 96.00%, unknown if currently breastfeeding.  Physical Exam:  General: alert and no distress Lochia: appropriate Uterine Fundus: firm Incision: healing well DVT Evaluation: No evidence of DVT seen on physical exam.   Recent Labs  09/02/13 2215 09/03/13 0615  HGB 12.8 11.9*  HCT 36.8 35.1*    Assessment/Plan: Discharge home   LOS: 3 days   Jazae Gandolfi A 09/05/2013, 8:30 AM

## 2013-09-05 NOTE — Discharge Summary (Signed)
Obstetric Discharge Summary Reason for Admission: onset of labor Prenatal Procedures: ultrasound Intrapartum Procedures: spontaneous vaginal delivery Postpartum Procedures: Rubella Ig Complications-Operative and Postpartum: none Hemoglobin  Date Value Range Status  09/03/2013 11.9* 12.0 - 15.0 g/dL Final     HCT  Date Value Range Status  09/03/2013 35.1* 36.0 - 46.0 % Final    Physical Exam:  General: alert and no distress Lochia: appropriate Uterine Fundus: firm Incision: healing well DVT Evaluation: No evidence of DVT seen on physical exam.  Discharge Diagnoses: Term Pregnancy-delivered  Discharge Information: Date: 09/05/2013 Activity: pelvic rest Diet: routine Medications: PNV, Ibuprofen, Colace and Percocet Condition: stable Instructions: refer to practice specific booklet Discharge to: home Follow-up Information   Follow up with Antionette CharJACKSON-MOORE,LISA A, MD. Schedule an appointment as soon as possible for Brown visit in 2 weeks.   Specialty:  Obstetrics and Gynecology   Contact information:   [address] [place] [id number] [phone]       Newborn Data: Live born female  Birth Weight: 7 lb 1.8 oz (3225 g) APGAR: 8, 9  Home with mother.  Jade Brown,Jade Brown 09/05/2013, 8:33 AM

## 2013-09-05 NOTE — Discharge Instructions (Signed)
[facility] Ask any questions about feeding, diapering, and baby care before you leave the hospital. Ask again if you do not understand. Ask when you need to see the doctor again. There are several things you must have before your baby comes home.  Infant car seat.  Crib.  Do not let your baby sleep in a bed with you or anyone else.  If you do not have a bed for your baby, ask the doctor what you can use that will be safe for the baby to sleep in. Infant feeding supplies:  6 to 8 bottles (8 oz. size).  6 to 8 nipples.  Measuring cup.  Measuring tablespoon.  Bottle brush.  Sterilizer (or use any large pan or kettle with a lid).  Formula that contains iron.  A way to boil and cool water. Breastfeeding supplies:  Breast pump.  Nipple cream. Clothing:  24 to 36 cloth diapers and waterproof diaper covers or a box of disposable diapers. You may need as many as 10 to 12 diapers per day.  3 onesies (other clothing will depend on the time of year and the weather).  3 receiving blankets.  3 baby pajamas or gowns.  3 bibs. Bath equipment:  Mild soap.  Petroleum jelly. No baby oil or powder.  Soft cloth towel and wash cloth.  Cotton balls.  Separate bath basin for baby. Only sponge bathe until umbilical cord and circumcision are healed. Other supplies:  Thermometer and bulb syringe (ask the hospital to send them home with you). Ask your doctor about how you should take your baby's temperature.  One to two pacifiers. Prepare for an emergency:  Know how to get to the hospital and know where to admit your baby.  Put all doctor numbers near your house phone and in your cell phone if you have one. Prepare your family:  Talk with siblings about the baby coming home and how they feel about it.  Decide how you want to handle visitors and other family members.  Take offers for help with the baby. You will need time to adjust. Know when to call the doctor.   GET HELP RIGHT AWAY IF:  Your baby's temperature is greater than 100.4 F (38 C).  The softspot on your baby's head starts to bulge.  Your baby is crying with no tears or has no wet diapers for 6 hours.  Your baby has rapid breathing.  Your baby is not as alert. Document Released: 07/08/2008 Document Revised: 10/18/2011 Document Reviewed: 10/15/2010 [facility] Patient Information 2014 [place], [place].  Vaginal Delivery Care After Refer to this sheet in the next few weeks. These discharge instructions provide you with information on caring for yourself after delivery. Your caregiver may also give you specific instructions. Your treatment has been planned according to the most current medical practices available, but problems sometimes occur. Call your caregiver if you have any problems or questions after you go home. HOME CARE INSTRUCTIONS  Take over-the-counter or prescription medicines only as directed by your caregiver or pharmacist.  Do not drink alcohol, especially if you are breastfeeding or taking medicine to relieve pain.  Do not chew or smoke tobacco.  Do not use illegal drugs.  Continue to use good perineal care. Good perineal care includes:  Wiping your perineum from front to back.  Keeping your perineum clean.  Do not use tampons or douche until your caregiver says it is okay.  Shower, wash your hair, and take tub baths as  directed by your caregiver.  Wear a well-fitting bra that provides breast support.  Eat healthy foods.  Drink enough fluids to keep your urine clear or pale yellow.  Eat high-fiber foods such as whole grain cereals and breads, brown rice, beans, and fresh fruits and vegetables every day. These foods may help prevent or relieve constipation.  Follow your cargiver's recommendations regarding resumption of activities such as climbing stairs, driving, lifting, exercising, or traveling.  Talk to your caregiver about resuming sexual activities.  Resumption of sexual activities is dependent upon your risk of infection, your rate of healing, and your comfort and desire to resume sexual activity.  Try to have someone help you with your household activities and your newborn for at least a few days after you leave the hospital.  Rest as much as possible. Try to rest or take a nap when your newborn is sleeping.  Increase your activities gradually.  Keep all of your scheduled postpartum appointments. It is very important to keep your scheduled follow-up appointments. At these appointments, your caregiver will be checking to make sure that you are healing physically and emotionally. SEEK MEDICAL CARE IF:   You are passing large clots from your vagina. Save any clots to show your caregiver.  You have a foul smelling discharge from your vagina.  You have trouble urinating.  You are urinating frequently.  You have pain when you urinate.  You have a change in your bowel movements.  You have increasing redness, pain, or swelling near your vaginal incision (episiotomy) or vaginal tear.  You have pus draining from your episiotomy or vaginal tear.  Your episiotomy or vaginal tear is separating.  You have painful, hard, or reddened breasts.  You have a severe headache.  You have blurred vision or see spots.  You feel sad or depressed.  You have thoughts of hurting yourself or your newborn.  You have questions about your care, the care of your newborn, or medicines.  You are dizzy or lightheaded.  You have a rash.  You have nausea or vomiting.  You were breastfeeding and have not had a menstrual period within 12 weeks after you stopped breastfeeding.  You are not breastfeeding and have not had a menstrual period by the 12th week after delivery.  You have a fever. SEEK IMMEDIATE MEDICAL CARE IF:   You have persistent pain.  You have chest pain.  You have shortness of breath.  You faint.  You have leg pain.  You  have stomach pain.  Your vaginal bleeding saturates two or more sanitary pads in 1 hour. MAKE SURE YOU:   Understand these instructions.  Will watch your condition.  Will get help right away if you are not doing well or get worse. Document Released: 07/23/2000 Document Revised: 04/19/2012 Document Reviewed: 03/22/2012 [facility] Patient Information 2014 [place], [place].

## 2013-09-10 ENCOUNTER — Encounter: Payer: Self-pay | Admitting: Obstetrics

## 2013-09-10 ENCOUNTER — Inpatient Hospital Stay (HOSPITAL_COMMUNITY)
Admission: AD | Admit: 2013-09-10 | Discharge: 2013-09-10 | Disposition: A | Discharge status: Home / Self Care | Payer: Medicaid Other | Source: Ambulatory Visit | Attending: Obstetrics & Gynecology | Admitting: Obstetrics & Gynecology

## 2013-09-10 ENCOUNTER — Encounter (HOSPITAL_COMMUNITY): Payer: Self-pay | Admitting: *Deleted

## 2013-09-10 DIAGNOSIS — O9989 Other specified diseases and conditions complicating pregnancy, childbirth and the puerperium: Principal | ICD-10-CM

## 2013-09-10 DIAGNOSIS — O9089 Other complications of the puerperium, not elsewhere classified: Secondary | ICD-10-CM

## 2013-09-10 DIAGNOSIS — O909 Complication of the puerperium, unspecified: Secondary | ICD-10-CM

## 2013-09-10 DIAGNOSIS — R030 Elevated blood-pressure reading, without diagnosis of hypertension: Secondary | ICD-10-CM | POA: Insufficient documentation

## 2013-09-10 DIAGNOSIS — R51 Headache: Secondary | ICD-10-CM | POA: Insufficient documentation

## 2013-09-10 DIAGNOSIS — O26899 Other specified pregnancy related conditions, unspecified trimester: Secondary | ICD-10-CM

## 2013-09-10 DIAGNOSIS — O99893 Other specified diseases and conditions complicating puerperium: Secondary | ICD-10-CM | POA: Insufficient documentation

## 2013-09-10 LAB — URINE MICROSCOPIC-ADD ON

## 2013-09-10 LAB — URINALYSIS, ROUTINE W REFLEX MICROSCOPIC
BILIRUBIN URINE: NEGATIVE
Glucose, UA: NEGATIVE mg/dL
Ketones, ur: NEGATIVE mg/dL
Nitrite: NEGATIVE
Protein, ur: 30 mg/dL — AB
Specific Gravity, Urine: 1.025 (ref 1.005–1.030)
UROBILINOGEN UA: 0.2 mg/dL (ref 0.0–1.0)
pH: 6.5 (ref 5.0–8.0)

## 2013-09-10 MED ORDER — IBUPROFEN 600 MG PO TABS: 600.0000 mg | ORAL_TABLET | Freq: Four times a day (QID) | ORAL | Status: DC | PRN

## 2013-09-10 NOTE — Discharge Instructions (Signed)
Alternate Tylenol dose with Ibuprofen so you do not take them at the same time.  For example, if you take Tylenol tablets at 5 pm, wait 2 hours to take Ibuprofen dose.   Headaches, Frequently Asked Questions MIGRAINE HEADACHES Q: What is migraine? What causes it? How can I treat it? A: Generally, migraine headaches begin as a dull ache. Then they develop into a constant, throbbing, and pulsating pain. You may experience pain at the temples. You may experience pain at the front or back of one or both sides of the head. The pain is usually accompanied by a combination of:  Nausea.  Vomiting.  Sensitivity to light and noise. Some people (about 15%) experience an aura (see below) before an attack. The cause of migraine is believed to be chemical reactions in the brain. Treatment for migraine may include over-the-counter or prescription medications. It may also include self-help techniques. These include relaxation training and biofeedback.  Q: What is an aura? A: About 15% of people with migraine get an "aura". This is a sign of neurological symptoms that occur before a migraine headache. You may see wavy or jagged lines, dots, or flashing lights. You might experience tunnel vision or blind spots in one or both eyes. The aura can include visual or auditory hallucinations (something imagined). It may include disruptions in smell (such as strange odors), taste or touch. Other symptoms include:  Numbness.  A "pins and needles" sensation.  Difficulty in recalling or speaking the correct word. These neurological events may last as long as 60 minutes. These symptoms will fade as the headache begins. Q: What is a trigger? A: Certain physical or environmental factors can lead to or "trigger" a migraine. These include:  Foods.  Hormonal changes.  Weather.  Stress. It is important to remember that triggers are different for everyone. To help prevent migraine attacks, you need to figure out which  triggers affect you. Keep a headache diary. This is a good way to track triggers. The diary will help you talk to your healthcare professional about your condition. Q: Does weather affect migraines? A: Bright sunshine, hot, humid conditions, and drastic changes in barometric pressure may lead to, or "trigger," a migraine attack in some people. But studies have shown that weather does not act as a trigger for everyone with migraines. Q: What is the link between migraine and hormones? A: Hormones start and regulate many of your body's functions. Hormones keep your body in balance within a constantly changing environment. The levels of hormones in your body are unbalanced at times. Examples are during menstruation, pregnancy, or menopause. That can lead to a migraine attack. In fact, about three quarters of all women with migraine report that their attacks are related to the menstrual cycle.  Q: Is there an increased risk of stroke for migraine sufferers? A: The likelihood of a migraine attack causing a stroke is very remote. That is not to say that migraine sufferers cannot have a stroke associated with their migraines. In persons under age 51, the most common associated factor for stroke is migraine headache. But over the course of a person's normal life span, the occurrence of migraine headache may actually be associated with a reduced risk of dying from cerebrovascular disease due to stroke.  Q: What are acute medications for migraine? A: Acute medications are used to treat the pain of the headache after it has started. Examples over-the-counter medications, NSAIDs, ergots, and triptans.  Q: What are the triptans? A: Triptans  are the newest class of abortive medications. They are specifically targeted to treat migraine. Triptans are vasoconstrictors. They moderate some chemical reactions in the brain. The triptans work on receptors in your brain. Triptans help to restore the balance of a neurotransmitter  called serotonin. Fluctuations in levels of serotonin are thought to be a main cause of migraine.  Q: Are over-the-counter medications for migraine effective? A: Over-the-counter, or "OTC," medications may be effective in relieving mild to moderate pain and associated symptoms of migraine. But you should see your caregiver before beginning any treatment regimen for migraine.  Q: What are preventive medications for migraine? A: Preventive medications for migraine are sometimes referred to as "prophylactic" treatments. They are used to reduce the frequency, severity, and length of migraine attacks. Examples of preventive medications include antiepileptic medications, antidepressants, beta-blockers, calcium channel blockers, and NSAIDs (nonsteroidal anti-inflammatory drugs). Q: Why are anticonvulsants used to treat migraine? A: During the past few years, there has been an increased interest in antiepileptic drugs for the prevention of migraine. They are sometimes referred to as "anticonvulsants". Both epilepsy and migraine may be caused by similar reactions in the brain.  Q: Why are antidepressants used to treat migraine? A: Antidepressants are typically used to treat people with depression. They may reduce migraine frequency by regulating chemical levels, such as serotonin, in the brain.  Q: What alternative therapies are used to treat migraine? A: The term "alternative therapies" is often used to describe treatments considered outside the scope of conventional Western medicine. Examples of alternative therapy include acupuncture, acupressure, and yoga. Another common alternative treatment is herbal therapy. Some herbs are believed to relieve headache pain. Always discuss alternative therapies with your caregiver before proceeding. Some herbal products contain arsenic and other toxins. TENSION HEADACHES Q: What is a tension-type headache? What causes it? How can I treat it? A: Tension-type headaches occur  randomly. They are often the result of temporary stress, anxiety, fatigue, or anger. Symptoms include soreness in your temples, a tightening band-like sensation around your head (a "vice-like" ache). Symptoms can also include a pulling feeling, pressure sensations, and contracting head and neck muscles. The headache begins in your forehead, temples, or the back of your head and neck. Treatment for tension-type headache may include over-the-counter or prescription medications. Treatment may also include self-help techniques such as relaxation training and biofeedback. CLUSTER HEADACHES Q: What is a cluster headache? What causes it? How can I treat it? A: Cluster headache gets its name because the attacks come in groups. The pain arrives with little, if any, warning. It is usually on one side of the head. A tearing or bloodshot eye and a runny nose on the same side of the headache may also accompany the pain. Cluster headaches are believed to be caused by chemical reactions in the brain. They have been described as the most severe and intense of any headache type. Treatment for cluster headache includes prescription medication and oxygen. SINUS HEADACHES Q: What is a sinus headache? What causes it? How can I treat it? A: When a cavity in the bones of the face and skull (a sinus) becomes inflamed, the inflammation will cause localized pain. This condition is usually the result of an allergic reaction, a tumor, or an infection. If your headache is caused by a sinus blockage, such as an infection, you will probably have a fever. An x-ray will confirm a sinus blockage. Your caregiver's treatment might include antibiotics for the infection, as well as antihistamines or decongestants.  REBOUND HEADACHES Q: What is a rebound headache? What causes it? How can I treat it? A: A pattern of taking acute headache medications too often can lead to a condition known as "rebound headache." A pattern of taking too much  headache medication includes taking it more than 2 days per week or in excessive amounts. That means more than the label or a caregiver advises. With rebound headaches, your medications not only stop relieving pain, they actually begin to cause headaches. Doctors treat rebound headache by tapering the medication that is being overused. Sometimes your caregiver will gradually substitute a different type of treatment or medication. Stopping may be a challenge. Regularly overusing a medication increases the potential for serious side effects. Consult a caregiver if you regularly use headache medications more than 2 days per week or more than the label advises. ADDITIONAL QUESTIONS AND ANSWERS Q: What is biofeedback? A: Biofeedback is a self-help treatment. Biofeedback uses special equipment to monitor your body's involuntary physical responses. Biofeedback monitors:  Breathing.  Pulse.  Heart rate.  Temperature.  Muscle tension.  Brain activity. Biofeedback helps you refine and perfect your relaxation exercises. You learn to control the physical responses that are related to stress. Once the technique has been mastered, you do not need the equipment any more. Q: Are headaches hereditary? A: Four out of five (80%) of people that suffer report a family history of migraine. Scientists are not sure if this is genetic or a family predisposition. Despite the uncertainty, a child has a 50% chance of having migraine if one parent suffers. The child has a 75% chance if both parents suffer.  Q: Can children get headaches? A: By the time they reach high school, most young people have experienced some type of headache. Many safe and effective approaches or medications can prevent a headache from occurring or stop it after it has begun.  Q: What type of doctor should I see to diagnose and treat my headache? A: Start with your primary caregiver. Discuss his or her experience and approach to headaches. Discuss  methods of classification, diagnosis, and treatment. Your caregiver may decide to recommend you to a headache specialist, depending upon your symptoms or other physical conditions. Having diabetes, allergies, etc., may require a more comprehensive and inclusive approach to your headache. The National Headache Foundation will provide, upon request, a list of [facility] physician members in your state. Document Released: 10/16/2003 Document Revised: 10/18/2011 Document Reviewed: 03/25/2008 [facility] Patient Information 2014 [place], [place]. Hypertension During Pregnancy Hypertension is also called high blood pressure. It can occur at any time in life and during pregnancy. When you have hypertension, there is extra pressure inside your blood vessels that carry blood from the heart to the rest of your body (arteries). Hypertension during pregnancy can cause problems for you and your baby. Your baby might not weigh as much as it should at birth or might be born early (premature). Very bad cases of hypertension during pregnancy can be life threatening.  Different types of hypertension can occur during pregnancy.   Chronic hypertension. This happens when a woman has hypertension before pregnancy and it continues during pregnancy.  Gestational hypertension. This is when hypertension develops during pregnancy.  Preeclampsia or toxemia of pregnancy. This is a very serious type of hypertension that develops only during pregnancy. It is a disease that affects the whole body (systemic) and can be very dangerous for both mother and baby.  Gestational hypertension and preeclampsia usually go away after your baby  is born. Blood pressure generally stabilizes within 6 weeks. Women who have hypertension during pregnancy have a greater chance of developing hypertension later in life or with future pregnancies. RISK FACTORS Some factors make you more likely to develop hypertension during pregnancy. Risk factors include:  Having  hypertension before pregnancy.  Having hypertension during a previous pregnancy.  Being overweight.  Being older than 40.  Being pregnant with more than one baby (multiples).  Having diabetes or kidney problems. SIGNS AND SYMPTOMS Chronic and gestational hypertension rarely cause symptoms. Preeclampsia has symptoms, which may include:  Increased protein in your urine. Your health care provider will check for this at every prenatal visit.  Swelling of your hands and face.  Rapid weight gain.  Headaches.  Visual changes.  Being bothered by light.  Abdominal pain, especially in the right upper area.  Chest pain.  Shortness of breath.  Increased reflexes.  Seizures. Seizures occur with a more severe form of preeclampsia, called eclampsia. DIAGNOSIS   You may be diagnosed with hypertension during a regular prenatal exam. At each visit, tests may include:  Blood pressure checks.  A urine test to check for protein in your urine.  The type of hypertension you are diagnosed with depends on when you developed it. It also depends on your specific blood pressure reading.  Developing hypertension before 20 weeks of pregnancy is consistent with chronic hypertension.  Developing hypertension after 20 weeks of pregnancy is consistent with gestational hypertension.  Hypertension with increased urinary protein is diagnosed as preeclampsia.  Blood pressure measurements that stay above 160 systolic or 110 diastolic are a sign of severe preeclampsia. TREATMENT Treatment for hypertension during pregnancy varies. Treatment depends on the type of hypertension and how serious it is.  If you take medicine for chronic hypertension, you may need to switch medicines.  Drugs called ACE inhibitors should not be taken during pregnancy.  Low-dose aspirin may be suggested for women who have risk factors for preeclampsia.  If you have gestational hypertension, you may need to take a blood  pressure medicine that is safe during pregnancy. Your health care provider will recommend the appropriate medicine.  If you have severe preeclampsia, you may need to be in the hospital. Health care providers will watch you and the baby very closely. You also may need to take medicine (magnesium sulfate) to prevent seizures and lower blood pressure.  Sometimes an early delivery is needed. This may be the case if the condition worsens. It would be done to protect you and the baby. The only cure for preeclampsia is delivery. HOME CARE INSTRUCTIONS  Schedule and keep all of your regular appointments for prenatal care.  Only take over-the-counter or prescription medicines as directed by your health care provider. Tell your health care provider about all medicines you take.  Eat as little salt as possible.  Get regular exercise.  Do not drink alcohol.  Do not use tobacco products.  Do not drink products with caffeine.  Lie on your left side when resting. SEEK IMMEDIATE MEDICAL CARE IF:  You have severe abdominal pain.  You have sudden swelling in the hands, ankles, or face.  You gain 4 pounds (1.8 kg) or more in 1 week.  You vomit repeatedly.  You have vaginal bleeding.  You do not feel the baby moving as much.  You have a headache.  You have blurred or double vision.  You have muscle twitching or spasms.  You have shortness of breath.  You have  blue fingernails and lips.  You have blood in your urine. MAKE SURE YOU:  Understand these instructions.  Will watch your condition.  Will get help right away if you are not doing well or get worse. Document Released: 04/13/2011 Document Revised: 05/16/2013 Document Reviewed: 02/22/2013 [facility] Patient Information 2014 [place], [place].

## 2013-09-10 NOTE — MAU Provider Note (Signed)
S: 23 y.o. G2P1011 presents to MAU 7 days after NSVD with elevated BP by home health nurse today of 140/100.  She also reports headache not relieved by Tylenol at home.  She had elevated BP the last week of her pregnancy but was not on medication or induced early related to her blood pressure.  She denies visual disturbances, epigastric pain, n/v, dizziness, or fever/chills.   O: BP 113/65  Pulse 66  Temp(Src) 98.3 F (36.8 C) (Oral)  Resp 18  Ht 5\' 2"  (1.575 m)  Wt 190 lb 4 oz (86.297 kg)  BMI 34.79 kg/m2  SpO2 99%  LMP 11/28/2012  Breastfeeding? Yes  Physical Examination: General appearance - alert, well appearing, and in no distress and oriented to person, place, and time Chest - clear to auscultation, no wheezes, rales or rhonchi, symmetric air entry Heart - normal rate, regular rhythm, normal S1, S2, no murmurs, rubs, clicks or gallops Neurological - alert, oriented, normal speech, no focal findings or movement disorder noted, DTR's normal and symmetric  A: Elevated BP at home, 7 days postpartum Normotensive in MAU  P: RN called Dr Tamela OddiJackson-Moore and orders received D/C home Increase PO fluids/continue Tylenol for h/a Ibuprofen 600 mg Q 6 hours PRN sent to pt pharmacy Rest when baby is sleeping F/U in office Return to MAU as needed  Sharen CounterLisa Leftwich-Kirby Certified Nurse-Midwife

## 2013-09-10 NOTE — MAU Note (Signed)
Pt states home health RN came out, took multiple bp's, last bp was 140/100. Has been having bad headaches. Delivered 09/03/2013 vaginal delivery.

## 2013-09-10 NOTE — MAU Note (Signed)
Pt sent here by home health RN, called Dr. Tamela OddiJackson Moore with report of BP's in MAU  and to receive orders.  To follow up in office tomorrow, treat HA with tylenol and ibuprophen

## 2013-09-11 LAB — URINE CULTURE
CULTURE: NO GROWTH
Colony Count: NO GROWTH

## 2013-09-25 ENCOUNTER — Ambulatory Visit: Payer: Medicaid Other | Admitting: Advanced Practice Midwife

## 2013-09-28 ENCOUNTER — Ambulatory Visit: Payer: Medicaid Other | Admitting: Advanced Practice Midwife

## 2013-09-29 ENCOUNTER — Telehealth: Payer: Self-pay | Admitting: *Deleted

## 2013-09-29 MED ORDER — CEPHALEXIN 500 MG PO CAPS: 500.0000 mg | ORAL_CAPSULE | Freq: Two times a day (BID) | ORAL | Status: AC

## 2013-09-29 NOTE — Telephone Encounter (Signed)
Message copied by Glendell DockerKNIGHT, Elvyn Krohn on Sat Sep 29, 2013 12:17 PM ------      Message from: Antionette CharJACKSON-MOORE, LISA      Created: Sun Sep 16, 2013  4:21 PM       Treat UTI x 1 week ------

## 2013-09-29 NOTE — Telephone Encounter (Signed)
Call placed to patient. Patient was advised per Dr Delora FuelJacson-Moore instructions.

## 2013-10-04 NOTE — H&P (Signed)
Jade CahillSymone D Brown is a 23 y.o. female presenting for UC's. Maternal Medical History:  Reason for admission: Contractions.  23 yo G2 P0.  Presents at term with UC's.  Fetal activity: Perceived fetal activity is normal.    Prenatal Complications - Diabetes: none.    OB History as of 09/18/13   Grav Para Term Preterm Abortions TAB SAB Ect Mult Living   2 1 1  1     1      Past Medical History  Diagnosis Date  . Gallstones    Past Surgical History  Procedure Laterality Date  . Dilation and curettage of uterus     Family History: family history includes Diabetes in her maternal grandmother and mother; Hypertension in her mother. Social History:  reports that she quit smoking about 2 years ago. She has never used smokeless tobacco. She reports that she does not drink alcohol or use illicit drugs.   Prenatal Transfer Tool  Maternal Diabetes: No Genetic Screening: Normal Maternal Ultrasounds/Referrals: Normal Fetal Ultrasounds or other Referrals:  None Maternal Substance Abuse:  No Significant Maternal Medications:  None Significant Maternal Lab Results:  None Other Comments:  None  Review of Systems  All other systems reviewed and are negative.    Dilation: 10 Effacement (%): 100 Station: 0;+1 Exam by:: Lucas MallowKlashley, RN Blood pressure 128/87, pulse 85, temperature 98.6 F (37 C), temperature source Oral, resp. rate 20, last menstrual period 11/28/2012, SpO2 96.00%, currently breastfeeding. Maternal Exam:  Abdomen: Patient reports no abdominal tenderness. Fetal presentation: vertex  Pelvis: adequate for delivery.   Cervix: Cervix evaluated by digital exam.     Physical Exam  Nursing note and vitals reviewed. Constitutional: She is oriented to person, place, and time. She appears well-developed and well-nourished.  HENT:  Head: Normocephalic and atraumatic.  Eyes: Conjunctivae are normal. Pupils are equal, round, and reactive to light.  Neck: Normal range of motion. Neck  supple.  Cardiovascular: Normal rate and regular rhythm.   Respiratory: Effort normal.  GI: Soft.  Genitourinary: Vagina normal and uterus normal.  Musculoskeletal: Normal range of motion.  Neurological: She is alert and oriented to person, place, and time.  Skin: Skin is warm and dry.  Psychiatric: She has a normal mood and affect. Her behavior is normal. Judgment and thought content normal.    Prenatal labs: ABO, Rh: --/--/A NEG (11/03 1555) Antibody: NEG (11/03 1555) Rubella: 0.79 (07/07 1027) RPR: NON REACTIVE (01/25 2215)  HBsAg: NEGATIVE (07/07 1027)  HIV: NON REACTIVE (10/06 1333)  GBS: POSITIVE (12/30 1610)   Assessment/Plan: Term pregnancy.  Active labor.  Admit.   Jade Brown A 10/04/2013, 12:40 PM

## 2013-10-19 ENCOUNTER — Ambulatory Visit (INDEPENDENT_AMBULATORY_CARE_PROVIDER_SITE_OTHER): Payer: Medicaid Other | Admitting: Advanced Practice Midwife

## 2013-10-19 ENCOUNTER — Encounter: Payer: Self-pay | Admitting: Advanced Practice Midwife

## 2013-10-19 VITALS — BP 120/70 | HR 82 | Wt 195.0 lb

## 2013-10-19 DIAGNOSIS — Z7251 High risk heterosexual behavior: Secondary | ICD-10-CM

## 2013-10-19 LAB — POCT URINE PREGNANCY: Preg Test, Ur: NEGATIVE

## 2013-10-19 NOTE — Progress Notes (Signed)
Subjective:     Jade Brown is a 23 y.o. female who presents for a postpartum visit. She is 6 weeks postpartum following a spontaneous vaginal delivery. I have fully reviewed the prenatal and intrapartum course. The delivery was at 39 gestational weeks. Outcome: spontaneous vaginal delivery. Anesthesia: epidural. Postpartum course has been going well. Baby's course has been going well. Baby is feeding by both breast and bottle - Octavia HeirGerber Soothe. Bleeding no bleeding. Bowel function is normal. Bladder function is normal. Patient is sexually active.  Last unprotected intercourse was a week ago. Pt request UPT today.  UPT today is negative. Contraception method is none.  Pt is interested in getting Nexplanon placed. Postpartum depression screening: negative.  The following portions of the patient's history were reviewed and updated as appropriate: allergies, current medications, past family history, past medical history, past social history, past surgical history and problem list.  Review of Systems Pertinent items are noted in HPI.   Objective:   Filed Vitals:   10/19/13 0923  BP: 120/70  Pulse: 82     BP 120/70  Pulse 82  Wt 195 lb (88.451 kg)  LMP 10/09/2013  Breastfeeding? Yes  General:  alert and cooperative   Breasts:  inspection negative, no nipple discharge or bleeding, no masses or nodularity palpable  Lungs: clear to auscultation bilaterally  Heart:  regular rate and rhythm, S1, S2 normal, no murmur, click, rub or gallop  Abdomen: soft, non-tender; bowel sounds normal; no masses,  no organomegaly   Vulva:  normal  Vagina: normal vagina           Rectal Exam: Not performed.        Assessment:     6 postpartum exam. Pap smear not done at today's visit.  Patient desires Nexplanon, appropriate candidate once pregnancy is ruled out Breastfeeding  Plan:    1. Contraception: Nexplanon, plan in 1-2 weeks. Patient to have negative pregnancy test and abstain or use condoms  until inserted. 2. Breast and Bottle feeding 3. Follow up in: 1 week or as needed.   20 min spent with patient greater than 80% spent in counseling and coordination of care.  Amy Wilson SingerWren CNM

## 2013-10-23 ENCOUNTER — Ambulatory Visit: Payer: Medicaid Other | Admitting: Advanced Practice Midwife

## 2013-11-02 ENCOUNTER — Encounter: Payer: Self-pay | Admitting: Advanced Practice Midwife

## 2013-11-02 ENCOUNTER — Ambulatory Visit (INDEPENDENT_AMBULATORY_CARE_PROVIDER_SITE_OTHER): Payer: Medicaid Other | Admitting: Advanced Practice Midwife

## 2013-11-02 VITALS — BP 108/76 | HR 66 | Temp 97.7°F | Ht 62.0 in | Wt 201.0 lb

## 2013-11-02 DIAGNOSIS — IMO0001 Reserved for inherently not codable concepts without codable children: Secondary | ICD-10-CM

## 2013-11-02 DIAGNOSIS — Z3202 Encounter for pregnancy test, result negative: Secondary | ICD-10-CM

## 2013-11-02 DIAGNOSIS — Z30017 Encounter for initial prescription of implantable subdermal contraceptive: Secondary | ICD-10-CM

## 2013-11-02 LAB — POCT URINE PREGNANCY: PREG TEST UR: NEGATIVE

## 2013-11-02 NOTE — Progress Notes (Signed)
  Subjective:     Delphina CahillSymone D Leisure is a 23 y.o. female who presents for a postpartum visit. She is 8 weeks postpartum following a spontaneous vaginal delivery. I have fully reviewed the prenatal and intrapartum course. The delivery was at 39.6  gestational weeks. Outcome: spontaneous vaginal delivery. Anesthesia: epidural. Postpartum course has been WNL. Baby's course has been WNL. Baby is feeding by both breast and bottle - Daron OfferGerber Goodstart Soothe. Bleeding no bleeding. Bowel function is normal. Bladder function is normal. Patient is not sexually active. Contraception method is abstinence. Postpartum depression screening: negative. Patient is taking promethazine for her gallstones. Patient states she has not had sex since her last visit. Patient is also in the office today for nexplanon insertion.   Patient would like referral for gallstones.  The following portions of the patient's history were reviewed and updated as appropriate: allergies, current medications, past family history, past medical history, past social history, past surgical history and problem list.  Review of Systems Pertinent items are noted in HPI.   Objective:    Ht 5\' 2"  (1.575 m)  Wt 201 lb (91.173 kg)  BMI 36.75 kg/m2  LMP 10/09/2013  Breastfeeding? Yes  General:  alert and cooperative  See Nexplanon Note     Assessment:     6 postpartum exam. Pap smear not done at today's visit.   Plan:    1. Contraception: Nexplanon 2. Gallstones, referral made to Gen Surgery 3. Follow up in: PRN .  30 min spent with patient greater than 80% spent in counseling and coordination of care.  Milford Cilento Wilson SingerWren CNM

## 2013-11-02 NOTE — Progress Notes (Signed)
Nexplanon Procedure Note   PRE-OP DIAGNOSIS: desired long-term, reversible contraception  POST-OP DIAGNOSIS: Same  PROCEDURE: Nexplanon  placement Performing Provider: Dory HornAmy Brehanna Deveny CNM   Patient education prior to procedure, explained risk, benefits of Nexplanon, reviewed alternative options. Patient reported understanding. Gave consent to continue with procedure.   PROCEDURE:  Pregnancy Text :  Negative Site (check):      left arm         Sterile Preparation:   Betadinex3 Lot # [id number] Expiration Date 04/2016  Insertion site was selected 8 - 10 cm from medial epicondyle and marked along with guiding site using sterile marker. Procedure area was prepped and draped in a sterile fashion. Nexplanon  was inserted subcutaneously.Needle was removed from the insertion site. Nexplanon capsule was palpated by provider and patient to assure satisfactory placement. Dressing applied.  Followup: The patient tolerated the procedure well without complications.  Standard post-procedure care is explained and return precautions are given.  Jade Brown CNM

## 2013-11-07 ENCOUNTER — Telehealth (INDEPENDENT_AMBULATORY_CARE_PROVIDER_SITE_OTHER): Payer: Self-pay

## 2013-11-07 NOTE — Telephone Encounter (Signed)
Called and left message for patient regarding appointment for 11/15/13.  Appointment time need's to be changed to 10:45 am on 11/15/13 w/Dr. Derrell Lollingamirez due to add on surgical case

## 2013-11-07 NOTE — Telephone Encounter (Signed)
Pt advised of attached schedule change. Pt will keep this appt.

## 2013-11-15 ENCOUNTER — Ambulatory Visit (INDEPENDENT_AMBULATORY_CARE_PROVIDER_SITE_OTHER): Payer: Medicaid Other | Admitting: General Surgery

## 2013-11-23 ENCOUNTER — Emergency Department (HOSPITAL_COMMUNITY)
Admission: EM | Admit: 2013-11-23 | Discharge: 2013-11-23 | Discharge status: Against Medical Advice | Payer: Medicaid Other | Source: Home / Self Care

## 2013-11-29 ENCOUNTER — Encounter (INDEPENDENT_AMBULATORY_CARE_PROVIDER_SITE_OTHER): Payer: Self-pay | Admitting: General Surgery

## 2013-11-29 ENCOUNTER — Ambulatory Visit (INDEPENDENT_AMBULATORY_CARE_PROVIDER_SITE_OTHER): Payer: Medicaid Other | Admitting: General Surgery

## 2013-11-29 VITALS — BP 132/86 | HR 80 | Temp 97.6°F | Resp 14 | Ht 63.0 in | Wt 196.4 lb

## 2013-11-29 DIAGNOSIS — K8020 Calculus of gallbladder without cholecystitis without obstruction: Secondary | ICD-10-CM

## 2013-11-29 NOTE — Progress Notes (Signed)
Patient ID: Jade Brown, female   DOB: 03/23/1991, 22 y.o.   MRN: [id number]  Chief Complaint  Patient presents with  . New Evaluation    eval gallstones    HPI Jade Brown is a 23 y.o. female.  The patient is a 23-year-old female who is referred for evaluation of right upper quadrant/pericolic. This states she's had pain since being 7 months pregnant. Patient subsequent to her child without any issues. Patient continued with daily abdominal pain the right upper quadrant. She states that it is associated with meals. Patient does have a diet of high fatty foods. HPI  Past Medical History  Diagnosis Date  . Gallstones     Past Surgical History  Procedure Laterality Date  . Dilation and curettage of uterus      Family History  Problem Relation Age of Onset  . Hypertension Mother   . Diabetes Mother   . Diabetes Maternal Grandmother     Social History History  Substance Use Topics  . Smoking status: Former Smoker    Quit date: 01/12/2011  . Smokeless tobacco: Never Used  . Alcohol Use: No    Allergies  Allergen Reactions  . Flagyl [Metronidazole] Nausea And Vomiting    Current Outpatient Prescriptions  Medication Sig Dispense Refill  . acetaminophen (TYLENOL) 500 MG tablet Take 500 mg by mouth every 6 (six) hours as needed for headache.      . ibuprofen (ADVIL,MOTRIN) 600 MG tablet Take 1 tablet (600 mg total) by mouth every 6 (six) hours as needed.  30 tablet  1   No current facility-administered medications for this visit.    Review of Systems Review of Systems  Constitutional: Negative.   HENT: Negative.   Respiratory: Negative.   Cardiovascular: Negative.   Gastrointestinal: Negative.   Neurological: Negative.   All other systems reviewed and are negative.   Blood pressure 132/86, pulse 80, temperature 97.6 F (36.4 C), temperature source Temporal, resp. rate 14, height 5' 3" (1.6 m), weight 196 lb 6.4 oz (89.086 kg), currently  breastfeeding.  Physical Exam Physical Exam  Constitutional: She is oriented to person, place, and time. She appears well-developed and well-nourished.  HENT:  Head: Normocephalic and atraumatic.  Eyes: Conjunctivae and EOM are normal. Pupils are equal, round, and reactive to light.  Neck: Normal range of motion. Neck supple.  Cardiovascular: Normal rate, regular rhythm and normal heart sounds.   Pulmonary/Chest: Effort normal and breath sounds normal.  Abdominal: Soft. Bowel sounds are normal. She exhibits no distension and no mass. There is no tenderness. There is no rebound and no guarding.  Musculoskeletal: Normal range of motion.  Neurological: She is alert and oriented to person, place, and time.  Skin: Skin is warm and dry.  Psychiatric: She has a normal mood and affect.    Data Reviewed Ultrasound reveals multiple gallstones  Assessment    23-year-old female with symptomatic cholelithiasis     Plan    1. We'll proceed to the operating room for a laparoscopic cholecystectomy 2. All risks and benefits were discussed with the patient to generally include: infection, bleeding, possible need for post op ERCP, damage to the bile ducts, and bile leak. Alternatives were offered and described.  All questions were answered and the patient voiced understanding of the procedure and wishes to proceed at this point with a laparoscopic cholecystectomy         Tramaine Sauls 11/29/2013, 11:36 AM    

## 2013-12-01 ENCOUNTER — Emergency Department (HOSPITAL_COMMUNITY)
Admission: EM | Admit: 2013-12-01 | Discharge: 2013-12-01 | Disposition: A | Discharge status: Home / Self Care | Payer: Medicaid Other | Attending: Emergency Medicine | Admitting: Emergency Medicine

## 2013-12-01 DIAGNOSIS — K8020 Calculus of gallbladder without cholecystitis without obstruction: Secondary | ICD-10-CM | POA: Insufficient documentation

## 2013-12-01 DIAGNOSIS — R42 Dizziness and giddiness: Secondary | ICD-10-CM | POA: Insufficient documentation

## 2013-12-01 DIAGNOSIS — Z3202 Encounter for pregnancy test, result negative: Secondary | ICD-10-CM | POA: Insufficient documentation

## 2013-12-01 DIAGNOSIS — Z87891 Personal history of nicotine dependence: Secondary | ICD-10-CM | POA: Insufficient documentation

## 2013-12-01 DIAGNOSIS — K8050 Calculus of bile duct without cholangitis or cholecystitis without obstruction: Secondary | ICD-10-CM

## 2013-12-01 DIAGNOSIS — R1110 Vomiting, unspecified: Secondary | ICD-10-CM

## 2013-12-01 LAB — URINALYSIS, ROUTINE W REFLEX MICROSCOPIC
BILIRUBIN URINE: NEGATIVE
Glucose, UA: NEGATIVE mg/dL
KETONES UR: NEGATIVE mg/dL
NITRITE: NEGATIVE
PROTEIN: 100 mg/dL — AB
Specific Gravity, Urine: 1.025 (ref 1.005–1.030)
UROBILINOGEN UA: 1 mg/dL (ref 0.0–1.0)
pH: 6 (ref 5.0–8.0)

## 2013-12-01 LAB — CBC WITH DIFFERENTIAL/PLATELET
BASOS PCT: 0 % (ref 0–1)
Basophils Absolute: 0 10*3/uL (ref 0.0–0.1)
Eosinophils Absolute: 0.2 10*3/uL (ref 0.0–0.7)
Eosinophils Relative: 2 % (ref 0–5)
HEMATOCRIT: 36.8 % (ref 36.0–46.0)
HEMOGLOBIN: 12.1 g/dL (ref 12.0–15.0)
Lymphocytes Relative: 28 % (ref 12–46)
Lymphs Abs: 3.4 10*3/uL (ref 0.7–4.0)
MCH: 29.3 pg (ref 26.0–34.0)
MCHC: 32.9 g/dL (ref 30.0–36.0)
MCV: 89.1 fL (ref 78.0–100.0)
MONOS PCT: 5 % (ref 3–12)
Monocytes Absolute: 0.6 10*3/uL (ref 0.1–1.0)
NEUTROS ABS: 7.9 10*3/uL — AB (ref 1.7–7.7)
Neutrophils Relative %: 65 % (ref 43–77)
Platelets: 309 10*3/uL (ref 150–400)
RBC: 4.13 MIL/uL (ref 3.87–5.11)
RDW: 13.6 % (ref 11.5–15.5)
WBC: 12.1 10*3/uL — ABNORMAL HIGH (ref 4.0–10.5)

## 2013-12-01 LAB — COMPREHENSIVE METABOLIC PANEL
ALK PHOS: 84 U/L (ref 39–117)
ALT: 36 U/L — ABNORMAL HIGH (ref 0–35)
AST: 33 U/L (ref 0–37)
Albumin: 4.3 g/dL (ref 3.5–5.2)
BILIRUBIN TOTAL: 0.2 mg/dL — AB (ref 0.3–1.2)
BUN: 8 mg/dL (ref 6–23)
CHLORIDE: 105 meq/L (ref 96–112)
CO2: 22 mEq/L (ref 19–32)
Calcium: 9.3 mg/dL (ref 8.4–10.5)
Creatinine, Ser: 0.89 mg/dL (ref 0.50–1.10)
GLUCOSE: 96 mg/dL (ref 70–99)
POTASSIUM: 3.6 meq/L — AB (ref 3.7–5.3)
Sodium: 141 mEq/L (ref 137–147)
Total Protein: 7.1 g/dL (ref 6.0–8.3)

## 2013-12-01 LAB — URINE MICROSCOPIC-ADD ON

## 2013-12-01 LAB — LIPASE, BLOOD: LIPASE: 30 U/L (ref 11–59)

## 2013-12-01 LAB — PREGNANCY, URINE: PREG TEST UR: NEGATIVE

## 2013-12-01 MED ORDER — ONDANSETRON 4 MG PO TBDP: ORAL_TABLET | ORAL | Status: DC

## 2013-12-01 MED ORDER — ONDANSETRON HCL 4 MG/2ML IJ SOLN
4.0000 mg | Freq: Once | INTRAMUSCULAR | Status: AC
  Administered 2013-12-01: 4 mg via INTRAVENOUS
  Filled 2013-12-01: qty 2

## 2013-12-01 MED ORDER — HYDROCODONE-ACETAMINOPHEN 5-325 MG PO TABS: 2.0000 | ORAL_TABLET | ORAL | Status: DC | PRN

## 2013-12-01 MED ORDER — SODIUM CHLORIDE 0.9 % IV BOLUS (SEPSIS)
1000.0000 mL | Freq: Once | INTRAVENOUS | Status: AC
  Administered 2013-12-01: 1000 mL via INTRAVENOUS

## 2013-12-01 MED ORDER — METOCLOPRAMIDE HCL 5 MG/ML IJ SOLN
10.0000 mg | Freq: Once | INTRAMUSCULAR | Status: AC
  Administered 2013-12-01: 10 mg via INTRAVENOUS
  Filled 2013-12-01: qty 2

## 2013-12-01 MED ORDER — HYDROMORPHONE HCL PF 1 MG/ML IJ SOLN
1.0000 mg | Freq: Once | INTRAMUSCULAR | Status: AC
  Administered 2013-12-01: 1 mg via INTRAVENOUS
  Filled 2013-12-01: qty 1

## 2013-12-01 NOTE — ED Notes (Signed)
MD at bedside. 

## 2013-12-01 NOTE — ED Notes (Signed)
Pt. Made aware for the need of urine. 

## 2013-12-01 NOTE — Discharge Instructions (Signed)
Take Zofran for nausea and Motrin or Tylenol for pain. For severe pain take norco or vicodin however realize they have the potential for addiction and it can make you sleepy and has tylenol in it.  No operating machinery while taking. Call surgeon on Monday morning to discuss cholecystectomy this week.  Please let and no other you in the ER and we spoke with the surgeon on call. Return to ER for fevers, uncontrollable pain or vomiting or worsening symptoms. AVOID fatty foods.  If you were given medicines take as directed.  If you are on coumadin or contraceptives realize their levels and effectiveness is altered by many different medicines.  If you have any reaction (rash, tongues swelling, other) to the medicines stop taking and see a physician.   Please follow up as directed and return to the ER or see a physician for new or worsening symptoms.  Thank you. Filed Vitals:   12/01/13 0115 12/01/13 0135  BP:  144/84  Pulse:  83  Temp:  98 F (36.7 C)  TempSrc:  Oral  Resp:  16  SpO2: 99% 100%    Biliary Colic  Biliary colic is a steady or irregular pain in the upper abdomen. It is usually under the right side of the rib cage. It happens when gallstones interfere with the normal flow of bile from the gallbladder. Bile is a liquid that helps to digest fats. Bile is made in the liver and stored in the gallbladder. When you eat a meal, bile passes from the gallbladder through the cystic duct and the common bile duct into the small intestine. There, it mixes with partially digested food. If a gallstone blocks either of these ducts, the normal flow of bile is blocked. The muscle cells in the bile duct contract forcefully to try to move the stone. This causes the pain of biliary colic.  SYMPTOMS   A person with biliary colic usually complains of pain in the upper abdomen. This pain can be:  In the center of the upper abdomen just below the breastbone.  In the upper-right part of the abdomen, near  the gallbladder and liver.  Spread back toward the right shoulder blade.  Nausea and vomiting.  The pain usually occurs after eating.  Biliary colic is usually triggered by the digestive system's demand for bile. The demand for bile is high after fatty meals. Symptoms can also occur when a person who has been fasting suddenly eats a very large meal. Most episodes of biliary colic pass after 1 to 5 hours. After the most intense pain passes, your abdomen may continue to ache mildly for about 24 hours. DIAGNOSIS  After you describe your symptoms, your caregiver will perform a physical exam. He or she will pay attention to the upper right portion of your belly (abdomen). This is the area of your liver and gallbladder. An ultrasound will help your caregiver look for gallstones. Specialized scans of the gallbladder may also be done. Blood tests may be done, especially if you have fever or if your pain persists. PREVENTION  Biliary colic can be prevented by controlling the risk factors for gallstones. Some of these risk factors, such as heredity, increasing age, and pregnancy are a normal part of life. Obesity and a high-fat diet are risk factors you can change through a healthy lifestyle. Women going through menopause who take hormone replacement therapy (estrogen) are also more likely to develop biliary colic. TREATMENT   Pain medication may be prescribed.  You  may be encouraged to eat a fat-free diet.  If the first episode of biliary colic is severe, or episodes of colic keep retuning, surgery to remove the gallbladder (cholecystectomy) is usually recommended. This procedure can be done through small incisions using an instrument called a laparoscope. The procedure often requires a brief stay in the hospital. Some people can leave the hospital the same day. It is the most widely used treatment in people troubled by painful gallstones. It is effective and safe, with no complications in more than 90% of  cases.  If surgery cannot be done, medication that dissolves gallstones may be used. This medication is expensive and can take months or years to work. Only small stones will dissolve.  Rarely, medication to dissolve gallstones is combined with a procedure called shock-wave lithotripsy. This procedure uses carefully aimed shock waves to break up gallstones. In many people treated with this procedure, gallstones form again within a few years. PROGNOSIS  If gallstones block your cystic duct or common bile duct, you are at risk for repeated episodes of biliary colic. There is also a 25% chance that you will develop a gallbladder infection(acute cholecystitis), or some other complication of gallstones within 10 to 20 years. If you have surgery, schedule it at a time that is convenient for you and at a time when you are not sick. HOME CARE INSTRUCTIONS   Drink plenty of clear fluids.  Avoid fatty, greasy or fried foods, or any foods that make your pain worse.  Take medications as directed. SEEK MEDICAL CARE IF:   You develop a fever over 100.5 F (38.1 C).  Your pain gets worse over time.  You develop nausea that prevents you from eating and drinking.  You develop vomiting. SEEK IMMEDIATE MEDICAL CARE IF:   You have continuous or severe belly (abdominal) pain which is not relieved with medications.  You develop nausea and vomiting which is not relieved with medications.  You have symptoms of biliary colic and you suddenly develop a fever and shaking chills. This may signal cholecystitis. Call your caregiver immediately.  You develop a yellow color to your skin or the white part of your eyes (jaundice). Document Released: 12/27/2005 Document Revised: 10/18/2011 Document Reviewed: 03/07/2008 [facility] Patient Information 2014 [place], [place].

## 2013-12-01 NOTE — ED Provider Notes (Signed)
CSN: [id number]     Arrival date & time 12/01/13  0114 History   First MD Initiated Contact with Patient 12/01/13 0139     Chief Complaint  Patient presents with  . Abdominal Pain  . Nausea  . Emesis     (Consider location/radiation/quality/duration/timing/severity/associated sxs/prior Treatment) HPI Comments:  23 year old female with vaginal delivery, gallstone history presents with worsening right upper quadrant pain since earlier today. Similar to her previous episodes she's had for the past several months. Patient has known gallstones and saw surgery was scheduled cholecystectomy for mid May. Patient has had recurrent vomiting and worsening pain and feels she cannot make it total mid May. Patient has been eating multiple fatty foods even though she knows she has gallstones her last meal was she use. No fevers, mild chills. Pain worse with eating.  Patient is a 23 y.o. female presenting with abdominal pain and vomiting. The history is provided by the patient.  Abdominal Pain Associated symptoms: chills, nausea and vomiting   Associated symptoms: no chest pain, no dysuria, no fever and no shortness of breath   Emesis Associated symptoms: abdominal pain and chills   Associated symptoms: no headaches     Past Medical History  Diagnosis Date  . Gallstones    Past Surgical History  Procedure Laterality Date  . Dilation and curettage of uterus     Family History  Problem Relation Age of Onset  . Hypertension Mother   . Diabetes Mother   . Diabetes Maternal Grandmother    History  Substance Use Topics  . Smoking status: Former Smoker    Quit date: 01/12/2011  . Smokeless tobacco: Never Used  . Alcohol Use: No   OB History   Grav Para Term Preterm Abortions TAB SAB Ect Mult Living   2 1 1  1     1      Review of Systems  Constitutional: Positive for chills and appetite change. Negative for fever.  HENT: Negative for congestion.   Eyes: Negative for visual disturbance.   Respiratory: Negative for shortness of breath.   Cardiovascular: Negative for chest pain.  Gastrointestinal: Positive for nausea, vomiting and abdominal pain.  Genitourinary: Negative for dysuria and flank pain.  Musculoskeletal: Negative for back pain, neck pain and neck stiffness.  Skin: Negative for rash.  Neurological: Positive for light-headedness. Negative for headaches.      Allergies  Flagyl  Home Medications   Prior to Admission medications   Medication Sig Start Date End Date Taking? Authorizing Provider  ibuprofen (ADVIL,MOTRIN) 600 MG tablet Take 600 mg by mouth every 6 (six) hours as needed for mild pain.   Yes Historical Provider, MD  promethazine (PHENERGAN) 25 MG tablet Take 25 mg by mouth every 6 (six) hours as needed for nausea or vomiting.   Yes Historical Provider, MD  acetaminophen (TYLENOL) 500 MG tablet Take 500 mg by mouth every 6 (six) hours as needed for headache.    Historical Provider, MD   BP 144/84  Pulse 83  Temp(Src) 98 F (36.7 C) (Oral)  Resp 16  SpO2 100% Physical Exam  Nursing note and vitals reviewed. Constitutional: She is oriented to person, place, and time. She appears well-developed and well-nourished.  HENT:  Head: Normocephalic and atraumatic.  Mild dry mucous membranes  Eyes: Conjunctivae are normal. Right eye exhibits no discharge. Left eye exhibits no discharge.  Neck: Normal range of motion. Neck supple. No tracheal deviation present.  Cardiovascular: Normal rate and regular rhythm.  Pulmonary/Chest: Effort normal and breath sounds normal.  Abdominal: Soft. She exhibits no distension. There is tenderness (right upper quadrant). There is no guarding.  Musculoskeletal: She exhibits no edema.  Neurological: She is alert and oriented to person, place, and time.  Skin: Skin is warm. No rash noted.  Psychiatric: She has a normal mood and affect.    ED Course  Procedures (including critical care time)  EMERGENCY DEPARTMENT  BILIARY ULTRASOUND INTERPRETATION "Study: Limited Abdominal Ultrasound of the gallbladder and common bile duct."  INDICATIONS: Abdominal pain, RUQ pain, Nausea, Vomiting and Back pain Indication: Multiple views of the gallbladder and common bile duct were obtained in real-time with a Multi-frequency probe." PERFORMED BY:  Myself IMAGES ARCHIVED?: Yes FINDINGS: Gallstones present, Gallbladder wall normal in thickness and Sonographic Murphy's sign present LIMITATIONS: Body Habitus, Bowel Gas and Abdominal pain INTERPRETATION: Cholelithiasis   Labs Review Labs Reviewed  CBC WITH DIFFERENTIAL - Abnormal; Notable for the following:    WBC 12.1 (*)    Neutro Abs 7.9 (*)    All other components within normal limits  COMPREHENSIVE METABOLIC PANEL - Abnormal; Notable for the following:    Potassium 3.6 (*)    ALT 36 (*)    Total Bilirubin 0.2 (*)    All other components within normal limits  LIPASE, BLOOD  PREGNANCY, URINE  URINALYSIS, ROUTINE W REFLEX MICROSCOPIC    Imaging Review No results found.   EKG Interpretation None      MDM   Final diagnoses:  Biliary colic  Vomiting   Clinically worsening biliary colic. Pain medicines fluids and nausea medicines in ED. Basic blood work unremarkable patient has mild white blood cell count elevation but patient has had this in the past. Fluid bolus/ Bedside ultrasound showed multiple layering gallstones without wall thickening or pericholecystic fluid. With recurrent symptoms plan for surgery consult to determine possibility of cholecystectomy earlier than scheduled date.  Discuss case with surgeon on call and fortunately with the weekend and dizzy schedule without signs of cholecystitis patient will unlikely get surgery until Monday. Patient improved significantly on recheck with pain meds and antiemetics. Discussed option of observation in the hospital for symptom control versus close outpatient followup and changing surgery  appointment date later this week. Patient prefers to followup outpatient and understands reasons to return.   Enid SkeensJoshua M Avleen Bordwell, MD 12/01/13 [phone]

## 2013-12-01 NOTE — ED Notes (Signed)
Per EMS ,pt.is from home with complaint of abdominal pain at 10/10 which started at 10 pm last night, pt. Was diagnosed with gallstone and is scheduled for surgery on Dec 24, 2013. Reported of nausea and vomiting.

## 2013-12-01 NOTE — ED Notes (Signed)
Bed: WA09 Expected date:  Expected time:  Means of arrival:  Comments: EMS 19F abd pain

## 2013-12-03 ENCOUNTER — Encounter (HOSPITAL_COMMUNITY): Payer: Self-pay | Admitting: Pharmacy Technician

## 2013-12-03 ENCOUNTER — Encounter (HOSPITAL_COMMUNITY): Payer: Self-pay | Admitting: *Deleted

## 2013-12-03 ENCOUNTER — Other Ambulatory Visit (INDEPENDENT_AMBULATORY_CARE_PROVIDER_SITE_OTHER): Payer: Self-pay | Admitting: General Surgery

## 2013-12-03 MED ORDER — CEFAZOLIN SODIUM-DEXTROSE 2-3 GM-% IV SOLR
2.0000 g | INTRAVENOUS | Status: AC
  Administered 2013-12-04: 2 g via INTRAVENOUS
  Filled 2013-12-03: qty 50

## 2013-12-04 ENCOUNTER — Ambulatory Visit (HOSPITAL_COMMUNITY): Payer: Medicaid Other | Admitting: Anesthesiology

## 2013-12-04 ENCOUNTER — Ambulatory Visit (HOSPITAL_COMMUNITY)
Admission: RE | Admit: 2013-12-04 | Discharge: 2013-12-04 | Disposition: A | Discharge status: Home / Self Care | Payer: Medicaid Other | Source: Ambulatory Visit | Attending: General Surgery | Admitting: General Surgery

## 2013-12-04 ENCOUNTER — Encounter (HOSPITAL_COMMUNITY): Payer: Self-pay | Admitting: *Deleted

## 2013-12-04 ENCOUNTER — Encounter (HOSPITAL_COMMUNITY)
Admission: RE | Disposition: A | Discharge status: Home / Self Care | Payer: Self-pay | Source: Ambulatory Visit | Attending: General Surgery

## 2013-12-04 ENCOUNTER — Encounter (HOSPITAL_COMMUNITY): Payer: Medicaid Other | Admitting: Anesthesiology

## 2013-12-04 DIAGNOSIS — Z87891 Personal history of nicotine dependence: Secondary | ICD-10-CM | POA: Insufficient documentation

## 2013-12-04 DIAGNOSIS — K8020 Calculus of gallbladder without cholecystitis without obstruction: Secondary | ICD-10-CM | POA: Insufficient documentation

## 2013-12-04 DIAGNOSIS — K8010 Calculus of gallbladder with chronic cholecystitis without obstruction: Secondary | ICD-10-CM

## 2013-12-04 HISTORY — PX: CHOLECYSTECTOMY: SHX55

## 2013-12-04 SURGERY — LAPAROSCOPIC CHOLECYSTECTOMY
Anesthesia: General | Site: Abdomen

## 2013-12-04 MED ORDER — GLYCOPYRROLATE 0.2 MG/ML IJ SOLN
INTRAMUSCULAR | Status: AC
  Filled 2013-12-04: qty 4

## 2013-12-04 MED ORDER — LIDOCAINE HCL (CARDIAC) 20 MG/ML IV SOLN
INTRAVENOUS | Status: DC | PRN
  Administered 2013-12-04: 100 mg via INTRAVENOUS

## 2013-12-04 MED ORDER — SODIUM CHLORIDE 0.9 % IR SOLN
Status: DC | PRN
  Administered 2013-12-04: 1000 mL

## 2013-12-04 MED ORDER — NEOSTIGMINE METHYLSULFATE 1 MG/ML IJ SOLN
INTRAMUSCULAR | Status: AC
  Filled 2013-12-04: qty 10

## 2013-12-04 MED ORDER — 0.9 % SODIUM CHLORIDE (POUR BTL) OPTIME
TOPICAL | Status: DC | PRN
  Administered 2013-12-04: 1000 mL

## 2013-12-04 MED ORDER — FENTANYL CITRATE 0.05 MG/ML IJ SOLN
INTRAMUSCULAR | Status: AC
  Filled 2013-12-04: qty 5

## 2013-12-04 MED ORDER — PROMETHAZINE HCL 25 MG/ML IJ SOLN
INTRAMUSCULAR | Status: AC
  Filled 2013-12-04: qty 1

## 2013-12-04 MED ORDER — DEXAMETHASONE SODIUM PHOSPHATE 10 MG/ML IJ SOLN
INTRAMUSCULAR | Status: DC | PRN
  Administered 2013-12-04: 10 mg via INTRAVENOUS

## 2013-12-04 MED ORDER — HYDROMORPHONE HCL PF 1 MG/ML IJ SOLN
INTRAMUSCULAR | Status: AC
  Filled 2013-12-04: qty 2

## 2013-12-04 MED ORDER — OXYCODONE HCL 5 MG/5ML PO SOLN: 5.0000 mg | Freq: Once | ORAL | Status: DC | PRN

## 2013-12-04 MED ORDER — ONDANSETRON HCL 4 MG/2ML IJ SOLN
INTRAMUSCULAR | Status: AC
  Filled 2013-12-04: qty 2

## 2013-12-04 MED ORDER — OXYCODONE-ACETAMINOPHEN 5-325 MG PO TABS: 1.0000 | ORAL_TABLET | ORAL | Status: DC | PRN

## 2013-12-04 MED ORDER — BUPIVACAINE HCL 0.25 % IJ SOLN
INTRAMUSCULAR | Status: DC | PRN
  Administered 2013-12-04: 7 mL

## 2013-12-04 MED ORDER — NEOSTIGMINE METHYLSULFATE 1 MG/ML IJ SOLN
INTRAMUSCULAR | Status: DC | PRN
  Administered 2013-12-04: 4.5 mg via INTRAVENOUS

## 2013-12-04 MED ORDER — MIDAZOLAM HCL 2 MG/2ML IJ SOLN
INTRAMUSCULAR | Status: AC
  Filled 2013-12-04: qty 2

## 2013-12-04 MED ORDER — ACETAMINOPHEN 650 MG RE SUPP
650.0000 mg | RECTAL | Status: DC | PRN
  Filled 2013-12-04: qty 1

## 2013-12-04 MED ORDER — PROMETHAZINE HCL 25 MG/ML IJ SOLN
6.2500 mg | INTRAMUSCULAR | Status: DC | PRN
  Administered 2013-12-04: 12.5 mg via INTRAVENOUS

## 2013-12-04 MED ORDER — SUCCINYLCHOLINE CHLORIDE 20 MG/ML IJ SOLN
INTRAMUSCULAR | Status: AC
  Filled 2013-12-04: qty 1

## 2013-12-04 MED ORDER — OXYCODONE HCL 5 MG PO TABS
5.0000 mg | ORAL_TABLET | ORAL | Status: DC | PRN
  Administered 2013-12-04: 10 mg via ORAL
  Filled 2013-12-04: qty 2

## 2013-12-04 MED ORDER — ROCURONIUM BROMIDE 100 MG/10ML IV SOLN
INTRAVENOUS | Status: DC | PRN
  Administered 2013-12-04: 40 mg via INTRAVENOUS

## 2013-12-04 MED ORDER — SODIUM CHLORIDE 0.9 % IV SOLN: 250.0000 mL | INTRAVENOUS | Status: DC | PRN

## 2013-12-04 MED ORDER — PROPOFOL 10 MG/ML IV BOLUS
INTRAVENOUS | Status: AC
  Filled 2013-12-04: qty 20

## 2013-12-04 MED ORDER — ONDANSETRON HCL 4 MG/2ML IJ SOLN
INTRAMUSCULAR | Status: DC | PRN
  Administered 2013-12-04: 4 mg via INTRAVENOUS

## 2013-12-04 MED ORDER — CHLORHEXIDINE GLUCONATE 4 % EX LIQD
1.0000 "application " | Freq: Once | CUTANEOUS | Status: DC
  Filled 2013-12-04: qty 15

## 2013-12-04 MED ORDER — ACETAMINOPHEN 325 MG PO TABS
650.0000 mg | ORAL_TABLET | ORAL | Status: DC | PRN
  Filled 2013-12-04: qty 2

## 2013-12-04 MED ORDER — ACETAMINOPHEN 10 MG/ML IV SOLN
1000.0000 mg | Freq: Once | INTRAVENOUS | Status: AC
  Administered 2013-12-04: 1000 mg via INTRAVENOUS
  Filled 2013-12-04: qty 100

## 2013-12-04 MED ORDER — LACTATED RINGERS IV SOLN
INTRAVENOUS | Status: DC
Start: 2013-12-04 — End: 2013-12-04
  Administered 2013-12-04: 09:00:00 via INTRAVENOUS

## 2013-12-04 MED ORDER — GLYCOPYRROLATE 0.2 MG/ML IJ SOLN
INTRAMUSCULAR | Status: DC | PRN
  Administered 2013-12-04: .9 mg via INTRAVENOUS

## 2013-12-04 MED ORDER — DEXAMETHASONE SODIUM PHOSPHATE 10 MG/ML IJ SOLN
INTRAMUSCULAR | Status: AC
  Filled 2013-12-04: qty 1

## 2013-12-04 MED ORDER — SODIUM CHLORIDE 0.9 % IJ SOLN: 3.0000 mL | INTRAMUSCULAR | Status: DC | PRN

## 2013-12-04 MED ORDER — MIDAZOLAM HCL 5 MG/5ML IJ SOLN
INTRAMUSCULAR | Status: DC | PRN
  Administered 2013-12-04: 2 mg via INTRAVENOUS

## 2013-12-04 MED ORDER — FENTANYL CITRATE 0.05 MG/ML IJ SOLN
INTRAMUSCULAR | Status: DC | PRN
  Administered 2013-12-04 (×2): 50 ug via INTRAVENOUS
  Administered 2013-12-04: 150 ug via INTRAVENOUS

## 2013-12-04 MED ORDER — LIDOCAINE HCL (CARDIAC) 20 MG/ML IV SOLN
INTRAVENOUS | Status: AC
  Filled 2013-12-04: qty 5

## 2013-12-04 MED ORDER — HYDROMORPHONE HCL PF 1 MG/ML IJ SOLN
0.2500 mg | INTRAMUSCULAR | Status: DC | PRN
  Administered 2013-12-04 (×3): 0.5 mg via INTRAVENOUS
  Administered 2013-12-04 (×2): 0.25 mg via INTRAVENOUS

## 2013-12-04 MED ORDER — OXYCODONE HCL 5 MG PO TABS: 5.0000 mg | ORAL_TABLET | Freq: Once | ORAL | Status: DC | PRN

## 2013-12-04 MED ORDER — SODIUM CHLORIDE 0.9 % IJ SOLN
3.0000 mL | Freq: Two times a day (BID) | INTRAMUSCULAR | Status: DC
Start: 2013-12-04 — End: 2013-12-04

## 2013-12-04 MED ORDER — PROPOFOL 10 MG/ML IV BOLUS
INTRAVENOUS | Status: DC | PRN
  Administered 2013-12-04: 200 mg via INTRAVENOUS

## 2013-12-04 MED ORDER — LACTATED RINGERS IV SOLN
INTRAVENOUS | Status: DC | PRN
  Administered 2013-12-04: 10:00:00 via INTRAVENOUS

## 2013-12-04 MED ORDER — ROCURONIUM BROMIDE 50 MG/5ML IV SOLN
INTRAVENOUS | Status: AC
  Filled 2013-12-04: qty 1

## 2013-12-04 MED ORDER — BUPIVACAINE HCL (PF) 0.25 % IJ SOLN
INTRAMUSCULAR | Status: AC
  Filled 2013-12-04: qty 30

## 2013-12-04 SURGICAL SUPPLY — 44 items
BENZOIN TINCTURE PRP APPL 2/3 (GAUZE/BANDAGES/DRESSINGS) ×3 IMPLANT
BLADE 10 SAFETY STRL DISP (BLADE) IMPLANT
CANISTER SUCTION 2500CC (MISCELLANEOUS) ×3 IMPLANT
CHLORAPREP W/TINT 26ML (MISCELLANEOUS) ×3 IMPLANT
CLIP LIGATING HEMO O LOK GREEN (MISCELLANEOUS) ×3 IMPLANT
COVER MAYO STAND STRL (DRAPES) IMPLANT
COVER SURGICAL LIGHT HANDLE (MISCELLANEOUS) ×3 IMPLANT
COVER TRANSDUCER ULTRASND (DRAPES) ×3 IMPLANT
DEVICE TROCAR PUNCTURE CLOSURE (ENDOMECHANICALS) ×3 IMPLANT
DRAPE C-ARM 42X72 X-RAY (DRAPES) IMPLANT
DRAPE UTILITY 15X26 W/TAPE STR (DRAPE) ×6 IMPLANT
ELECT REM PT RETURN 9FT ADLT (ELECTROSURGICAL) ×3
ELECTRODE REM PT RTRN 9FT ADLT (ELECTROSURGICAL) ×1 IMPLANT
GAUZE SPONGE 2X2 8PLY STRL LF (GAUZE/BANDAGES/DRESSINGS) ×1 IMPLANT
GLOVE BIO SURGEON STRL SZ 6 (GLOVE) ×3 IMPLANT
GLOVE BIO SURGEON STRL SZ7.5 (GLOVE) ×6 IMPLANT
GLOVE BIOGEL PI IND STRL 7.0 (GLOVE) ×1 IMPLANT
GLOVE BIOGEL PI IND STRL 7.5 (GLOVE) ×1 IMPLANT
GLOVE BIOGEL PI INDICATOR 7.0 (GLOVE) ×2
GLOVE BIOGEL PI INDICATOR 7.5 (GLOVE) ×2
GLOVE SURG SS PI 7.0 STRL IVOR (GLOVE) ×3 IMPLANT
GOWN STRL REUS W/ TWL LRG LVL3 (GOWN DISPOSABLE) ×2 IMPLANT
GOWN STRL REUS W/ TWL XL LVL3 (GOWN DISPOSABLE) ×1 IMPLANT
GOWN STRL REUS W/TWL LRG LVL3 (GOWN DISPOSABLE) ×4
GOWN STRL REUS W/TWL XL LVL3 (GOWN DISPOSABLE) ×2
IV CATH 14GX2 1/4 (CATHETERS) IMPLANT
KIT BASIN OR (CUSTOM PROCEDURE TRAY) ×3 IMPLANT
KIT ROOM TURNOVER OR (KITS) ×3 IMPLANT
NEEDLE INSUFFLATION 14GA 120MM (NEEDLE) ×3 IMPLANT
NS IRRIG 1000ML POUR BTL (IV SOLUTION) ×3 IMPLANT
PAD ARMBOARD 7.5X6 YLW CONV (MISCELLANEOUS) ×6 IMPLANT
POUCH SPECIMEN RETRIEVAL 10MM (ENDOMECHANICALS) IMPLANT
SCISSORS LAP 5X35 DISP (ENDOMECHANICALS) ×3 IMPLANT
SET CHOLANGIOGRAPHY FRANKLIN (SET/KITS/TRAYS/PACK) IMPLANT
SET IRRIG TUBING LAPAROSCOPIC (IRRIGATION / IRRIGATOR) ×3 IMPLANT
SLEEVE ENDOPATH XCEL 5M (ENDOMECHANICALS) ×3 IMPLANT
SPECIMEN JAR SMALL (MISCELLANEOUS) ×3 IMPLANT
SPONGE GAUZE 2X2 STER 10/PKG (GAUZE/BANDAGES/DRESSINGS) ×2
SUT MNCRL AB 3-0 PS2 18 (SUTURE) ×3 IMPLANT
TOWEL OR 17X24 6PK STRL BLUE (TOWEL DISPOSABLE) IMPLANT
TOWEL OR 17X26 10 PK STRL BLUE (TOWEL DISPOSABLE) ×3 IMPLANT
TRAY LAPAROSCOPIC (CUSTOM PROCEDURE TRAY) ×3 IMPLANT
TROCAR XCEL NON-BLD 11X100MML (ENDOMECHANICALS) ×3 IMPLANT
TROCAR XCEL NON-BLD 5MMX100MML (ENDOMECHANICALS) ×3 IMPLANT

## 2013-12-04 NOTE — Discharge Instructions (Signed)
[facility] ______[facility] °LAPAROSCOPIC SURGERY: POST OP INSTRUCTIONS °Always review your discharge instruction sheet given to you by the facility where your surgery was performed. °IF YOU HAVE DISABILITY OR FAMILY LEAVE FORMS, YOU MUST BRING THEM TO THE OFFICE FOR PROCESSING.   °DO NOT GIVE THEM TO YOUR DOCTOR. ° °1. A prescription for pain medication may be given to you upon discharge.  Take your pain medication as prescribed, if needed.  If narcotic pain medicine is not needed, then you may take acetaminophen (Tylenol) or ibuprofen (Advil) as needed. °2. Take your usually prescribed medications unless otherwise directed. °3. If you need a refill on your pain medication, please contact your pharmacy.  They will contact our office to request authorization. Prescriptions will not be filled after 5pm or on week-ends. °4. You should follow a light diet the first few days after arrival home, such as soup and crackers, etc.  Be sure to include lots of fluids daily. °5. Most patients will experience some swelling and bruising in the area of the incisions.  Ice packs will help.  Swelling and bruising can take several days to resolve.  °6. It is common to experience some constipation if taking pain medication after surgery.  Increasing fluid intake and taking a stool softener (such as Colace) will usually help or prevent this problem from occurring.  A mild laxative (Milk of Magnesia or Miralax) should be taken according to package instructions if there are no bowel movements after 48 hours. °7. Unless discharge instructions indicate otherwise, you may remove your bandages 24-48 hours after surgery, and you may shower at that time.  You may have steri-strips (small skin tapes) in place directly over the incision.  These strips should be left on the skin for 7-10 days.  If your surgeon used skin glue on the incision, you may shower in 24 hours.  The glue will flake off over the next 2-3 weeks.  Any sutures or  staples will be removed at the office during your follow-up visit. °8. ACTIVITIES:  You may resume regular (light) daily activities beginning the next day--such as daily self-care, walking, climbing stairs--gradually increasing activities as tolerated.  You may have sexual intercourse when it is comfortable.  Refrain from any heavy lifting or straining until approved by your doctor. °a. You may drive when you are no longer taking prescription pain medication, you can comfortably wear a seatbelt, and you can safely maneuver your car and apply brakes. °b. RETURN TO WORK:  __________________________________________________________ °9. You should see your doctor in the office for a follow-up appointment approximately 2-3 weeks after your surgery.  Make sure that you call for this appointment within a day or two after you arrive home to insure a convenient appointment time. °10. OTHER INSTRUCTIONS: __________________________________________________________________________________________________________________________ __________________________________________________________________________________________________________________________ °WHEN TO CALL YOUR DOCTOR: °1. Fever over 101.0 °2. Inability to urinate °3. Continued bleeding from incision. °4. Increased pain, redness, or drainage from the incision. °5. Increasing abdominal pain ° °The clinic staff is available to answer your questions during regular business hours.  Please don’t hesitate to call and ask to speak to one of the nurses for clinical concerns.  If you have a medical emergency, go to the nearest emergency room or call 911.  A surgeon from [facility] is always on call at the hospital. °[address], [address], [place], [place]  [address] ? [address] [address], [place], [place]   [address] °[phone] ? [phone] ? FAX [phone] °Web site:   [email or web address] °

## 2013-12-04 NOTE — Transfer of Care (Signed)
Immediate Anesthesia Transfer of Care Note  Patient: Jade CahillSymone D Brown  Procedure(s) Performed: Procedure(s): LAPAROSCOPIC CHOLECYSTECTOMY (N/A)  Patient Location: PACU  Anesthesia Type:General  Level of Consciousness: awake, alert  and oriented  Airway & Oxygen Therapy: Patient Spontanous Breathing and Patient connected to nasal cannula oxygen  Post-op Assessment: Report given to PACU RN, Post -op Vital signs reviewed and stable and Patient moving all extremities X 4  Post vital signs: Reviewed and stable  Complications: No apparent anesthesia complications

## 2013-12-04 NOTE — Op Note (Signed)
12/04/2013  11:56 AM  PATIENT:  Delphina CahillSymone D Berk  23 y.o. female  PRE-OPERATIVE DIAGNOSIS:  CHOLELITHIASIS  POST-OPERATIVE DIAGNOSIS:  CHOLELITHIASIS  PROCEDURE:  Procedure(s): LAPAROSCOPIC CHOLECYSTECTOMY (N/A)  SURGEON:  Surgeon(s) and Role:    * Axel FillerArmando Rondal Vandevelde, MD - Primary . ASSISTANTS: none   ANESTHESIA:   local and general  EBL:  Total I/O In: 1200 [I.V.:1200] Out: -   BLOOD ADMINISTERED:none  DRAINS: none   LOCAL MEDICATIONS USED:  BUPIVICAINE   SPECIMEN:  Source of Specimen:  Gallbladder  DISPOSITION OF SPECIMEN:  PATHOLOGY  COUNTS:  YES  TOURNIQUET:  * No tourniquets in log *  DICTATION: .Dragon Dictation  Details of the procedure: The patient was taken to the operating and placed in the supine position with bilateral SCDs in place. A time out was called and all facts were verified. A pneumoperitoneum was obtained via A Veress needle technique to a pressure of 14mm of mercury. A 5mm trochar was then placed in the right upper quadrant under visualization, and there were no injuries to any abdominal organs. A 11 mm port was then placed in the umbilical region after infiltrating with local anesthesia under direct visualization. A second epigastric port was placed under direct visualization. The gallbladder was identified and retracted, the peritoneum was then sharply dissected from the gallbladder and this dissection was carried down to Calot's triangle. The cystic duct was identified and stripped away circumferentially and seen going into the gallbladder 360, the critical angle was obtained. 2 clips were placed proximally one distally and the cystic duct transected. The cystic artery was identified and 2 clips placed proximally and one distally and transected. We then proceeded to remove the gallbladder off the hepatic fossa with Bovie cautery. A retrieval bag was then placed in the abdomen and gallbladder placed in the bag. The hepatic fossa was then reexamined and  hemostasis was achieved with Bovie cautery and was excellent at this portion of the case. The subhepatic fossa and perihepatic fossa was then irrigated until the effluent was clear. The 11 mm trocar fascia was reapproximated with the Endo Close #1 Vicryl x1. The pneumoperitoneum was evacuated and all trochars removed under direct visulalization. The skin was then closed with 4-0 Monocryl and the skin dressed with Steri-Strips, gauze, and tape. The patient was awaken from general anesthesia and taken to the recovery room in stable condition.    PLAN OF CARE: Discharge to home after PACU  PATIENT DISPOSITION:  PACU - hemodynamically stable.   Delay start of Pharmacological VTE agent (>24hrs) due to surgical blood loss or risk of bleeding: not applicable

## 2013-12-04 NOTE — Anesthesia Procedure Notes (Signed)
Procedure Name: Intubation Date/Time: 12/04/2013 10:48 AM Performed by: Carmela RimaMARTINELLI, Coutney Wildermuth F Pre-anesthesia Checklist: Patient identified, Timeout performed, Emergency Drugs available, Suction available and Patient being monitored Patient Re-evaluated:Patient Re-evaluated prior to inductionOxygen Delivery Method: Circle system utilized Preoxygenation: Pre-oxygenation with 100% oxygen Intubation Type: IV induction Ventilation: Mask ventilation without difficulty Laryngoscope Size: Mac and 3 Grade View: Grade I Tube type: Oral Tube size: 7.0 mm Number of attempts: 1 Placement Confirmation: positive ETCO2,  ETT inserted through vocal cords under direct vision and breath sounds checked- equal and bilateral Secured at: 22 cm Tube secured with: Tape Dental Injury: Teeth and Oropharynx as per pre-operative assessment

## 2013-12-04 NOTE — Anesthesia Preprocedure Evaluation (Addendum)
Anesthesia Evaluation  Patient identified by MRN, date of birth, ID band Patient awake    Reviewed: Allergy & Precautions, H&P , NPO status , Patient's Chart, lab work & pertinent test results  Airway Mallampati: II TM Distance: >3 FB Neck ROM: full    Dental  (+) Dental Advidsory Given, Teeth Intact   Pulmonary former smoker,    Pulmonary exam normal       Cardiovascular negative cardio ROS      Neuro/Psych negative neurological ROS  negative psych ROS   GI/Hepatic negative GI ROS, Neg liver ROS,   Endo/Other  negative endocrine ROS  Renal/GU negative Renal ROS     Musculoskeletal   Abdominal Normal abdominal exam  (+)   Peds  Hematology   Anesthesia Other Findings   Reproductive/Obstetrics negative OB ROS                         Anesthesia Physical Anesthesia Plan  ASA: II  Anesthesia Plan: General ETT   Post-op Pain Management:    Induction:   Airway Management Planned:   Additional Equipment:   Intra-op Plan:   Post-operative Plan:   Informed Consent: I have reviewed the patients History and Physical, chart, labs and discussed the procedure including the risks, benefits and alternatives for the proposed anesthesia with the patient or authorized representative who has indicated his/her understanding and acceptance.   Dental Advisory Given  Plan Discussed with: Anesthesiologist, CRNA and Surgeon  Anesthesia Plan Comments:        Anesthesia Quick Evaluation

## 2013-12-04 NOTE — Anesthesia Postprocedure Evaluation (Signed)
  Anesthesia Post-op Note  Patient: Jade CahillSymone D Brown  Procedure(s) Performed: Procedure(s): LAPAROSCOPIC CHOLECYSTECTOMY (N/A)  Patient Location: PACU  Anesthesia Type:General  Level of Consciousness: awake  Airway and Oxygen Therapy: Patient Spontanous Breathing  Post-op Pain: mild  Post-op Assessment: Post-op Vital signs reviewed  Post-op Vital Signs: Reviewed  Last Vitals:  Filed Vitals:   12/04/13 1205  BP:   Pulse: 100  Temp:   Resp: 29    Complications: No apparent anesthesia complications

## 2013-12-04 NOTE — Interval H&P Note (Signed)
History and Physical Interval Note:  12/04/2013 10:08 AM  [name]  has presented today for surgery, with the diagnosis of CHOLELITHIASIS  The various methods of treatment have been discussed with the patient and family. After consideration of risks, benefits and other options for treatment, the patient has consented to  Procedure(s): LAPAROSCOPIC CHOLECYSTECTOMY (N/A) as a surgical intervention .  The patient's history has been reviewed, patient examined, no change in status, stable for surgery.  I have reviewed the patient's chart and labs.  Questions were answered to the patient's satisfaction.     Axel FillerArmando Trevious Rampey

## 2013-12-04 NOTE — Progress Notes (Signed)
STATES PAIN DOWN TO 5/10 ; NO C/O NAUSEA; ABLE TO VOID WITHOUT DIFFICULTY

## 2013-12-04 NOTE — H&P (View-Only) (Signed)
Patient ID: Jade CahillSymone D Brown, female   DOB: 09/27/1990, 23 y.o.   MRN: [id number]  Chief Complaint  Patient presents with  . New Evaluation    eval gallstones    HPI Jade Brown is a 23 y.o. female.  The patient is a 23 year old female who is referred for evaluation of right upper quadrant/pericolic. This states she's had pain since being 7 months pregnant. Patient subsequent to her child without any issues. Patient continued with daily abdominal pain the right upper quadrant. She states that it is associated with meals. Patient does have a diet of high fatty foods. HPI  Past Medical History  Diagnosis Date  . Gallstones     Past Surgical History  Procedure Laterality Date  . Dilation and curettage of uterus      Family History  Problem Relation Age of Onset  . Hypertension Mother   . Diabetes Mother   . Diabetes Maternal Grandmother     Social History History  Substance Use Topics  . Smoking status: Former Smoker    Quit date: 01/12/2011  . Smokeless tobacco: Never Used  . Alcohol Use: No    Allergies  Allergen Reactions  . Flagyl [Metronidazole] Nausea And Vomiting    Current Outpatient Prescriptions  Medication Sig Dispense Refill  . acetaminophen (TYLENOL) 500 MG tablet Take 500 mg by mouth every 6 (six) hours as needed for headache.      . ibuprofen (ADVIL,MOTRIN) 600 MG tablet Take 1 tablet (600 mg total) by mouth every 6 (six) hours as needed.  30 tablet  1   No current facility-administered medications for this visit.    Review of Systems Review of Systems  Constitutional: Negative.   HENT: Negative.   Respiratory: Negative.   Cardiovascular: Negative.   Gastrointestinal: Negative.   Neurological: Negative.   All other systems reviewed and are negative.   Blood pressure 132/86, pulse 80, temperature 97.6 F (36.4 C), temperature source Temporal, resp. rate 14, height 5\' 3"  (1.6 m), weight 196 lb 6.4 oz (89.086 kg), currently  breastfeeding.  Physical Exam Physical Exam  Constitutional: She is oriented to person, place, and time. She appears well-developed and well-nourished.  HENT:  Head: Normocephalic and atraumatic.  Eyes: Conjunctivae and EOM are normal. Pupils are equal, round, and reactive to light.  Neck: Normal range of motion. Neck supple.  Cardiovascular: Normal rate, regular rhythm and normal heart sounds.   Pulmonary/Chest: Effort normal and breath sounds normal.  Abdominal: Soft. Bowel sounds are normal. She exhibits no distension and no mass. There is no tenderness. There is no rebound and no guarding.  Musculoskeletal: Normal range of motion.  Neurological: She is alert and oriented to person, place, and time.  Skin: Skin is warm and dry.  Psychiatric: She has a normal mood and affect.    Data Reviewed Ultrasound reveals multiple gallstones  Assessment    23 year old female with symptomatic cholelithiasis     Plan    1. We'll proceed to the operating room for a laparoscopic cholecystectomy 2. All risks and benefits were discussed with the patient to generally include: infection, bleeding, possible need for post op ERCP, damage to the bile ducts, and bile leak. Alternatives were offered and described.  All questions were answered and the patient voiced understanding of the procedure and wishes to proceed at this point with a laparoscopic cholecystectomy         Axel Fillerrmando Rasul Decola 11/29/2013, 11:36 AM

## 2013-12-06 ENCOUNTER — Telehealth (INDEPENDENT_AMBULATORY_CARE_PROVIDER_SITE_OTHER): Payer: Self-pay

## 2013-12-06 ENCOUNTER — Encounter (INDEPENDENT_AMBULATORY_CARE_PROVIDER_SITE_OTHER): Payer: Self-pay

## 2013-12-06 ENCOUNTER — Encounter (HOSPITAL_COMMUNITY): Payer: Self-pay | Admitting: General Surgery

## 2013-12-06 NOTE — Telephone Encounter (Signed)
Called and left message for patient regarding her RTW note.  Patient need's to give me her RTW date so I can complete her note and fax to employer.

## 2013-12-06 NOTE — Telephone Encounter (Signed)
Patient called back to the office requesting a RTW date of 12/12/13.

## 2013-12-14 ENCOUNTER — Encounter: Payer: Self-pay | Admitting: Advanced Practice Midwife

## 2013-12-14 ENCOUNTER — Ambulatory Visit (INDEPENDENT_AMBULATORY_CARE_PROVIDER_SITE_OTHER): Payer: Medicaid Other | Admitting: Advanced Practice Midwife

## 2013-12-14 VITALS — BP 122/83 | HR 86 | Temp 98.6°F | Ht 63.0 in | Wt 192.0 lb

## 2013-12-14 DIAGNOSIS — Z3040 Encounter for surveillance of contraceptives, unspecified: Secondary | ICD-10-CM

## 2013-12-14 DIAGNOSIS — Z975 Presence of (intrauterine) contraceptive device: Secondary | ICD-10-CM

## 2013-12-14 NOTE — Progress Notes (Signed)
  Subjective:    Delphina CahillSymone D Guthrie is a 23 y.o. female who presents for contraception counseling. The patient has no complaints today. The patient is sexually active. Pertinent past medical history: recent gallbladder surgery  Patient reports she has had 1 period for 1 week following insertion.  Menstrual History: OB History   Grav Para Term Preterm Abortions TAB SAB Ect Mult Living   2 1 1  1     1       Menarche age: [age]  Patient's last menstrual period was 11/25/2013.    The following portions of the patient's history were reviewed and updated as appropriate: allergies, current medications, past family history, past medical history, past social history, past surgical history and problem list.  Review of Systems A comprehensive review of systems was negative.   Objective:    BP 122/83  Pulse 86  Temp(Src) 98.6 F (37 C)  Ht 5\' 3"  (1.6 m)  Wt 192 lb (87.091 kg)  BMI 34.02 kg/m2  LMP 11/25/2013  Breastfeeding? No General appearance: alert and cooperative Left Arm: Nexplanon insertion site w/ out REED. Approximated. Scare at insertion site.     Assessment:    23 y.o., continuing Nexplanon, no contraindications.   Plan:    Patient to RTC for annual exam w/ pap smear this year. Not able to stay today.  Nexplanon education and counseling done today.  Wenda Vanschaick Wilson SingerWren CNM

## 2013-12-21 ENCOUNTER — Ambulatory Visit (INDEPENDENT_AMBULATORY_CARE_PROVIDER_SITE_OTHER): Payer: Medicaid Other | Admitting: General Surgery

## 2013-12-21 ENCOUNTER — Encounter (INDEPENDENT_AMBULATORY_CARE_PROVIDER_SITE_OTHER): Payer: Self-pay | Admitting: General Surgery

## 2013-12-21 VITALS — BP 128/80 | HR 78 | Temp 97.8°F | Resp 14 | Wt 194.2 lb

## 2013-12-21 DIAGNOSIS — Z9889 Other specified postprocedural states: Secondary | ICD-10-CM

## 2013-12-21 NOTE — Progress Notes (Signed)
Patient ID: Jade CahillSymone D Brown, female   DOB: 04/16/1991, 23 y.o.   MRN: [id number] Post op course The patient is a 23 year old female status post laparoscopic cholecystectomy. The patient has been doing well since her operation. She does state she's had several occasions of diarrhea.  On Exam: Wounds are clean dry and intact  Pathology:   Chronic cholecystitis, cholelithiasis.  This was discussed with the patient.  Assessment and Plan 23 year old female status post laparoscopic cholecystectomy 1. We discussed a low fat diet to help with diarrhea. 2. Patient follow up as needed   Axel FillerArmando Ilias Stcharles, MD [facility] General & Minimally Invasive Surgery Trauma & Emergency Surgery

## 2014-01-25 ENCOUNTER — Ambulatory Visit: Payer: Medicaid Other | Admitting: Advanced Practice Midwife

## 2014-02-12 ENCOUNTER — Encounter (HOSPITAL_COMMUNITY): Payer: Self-pay | Admitting: Emergency Medicine

## 2014-02-12 ENCOUNTER — Emergency Department (HOSPITAL_COMMUNITY)
Admission: EM | Admit: 2014-02-12 | Discharge: 2014-02-12 | Disposition: A | Discharge status: Home / Self Care | Payer: Medicaid Other | Source: Home / Self Care | Attending: Emergency Medicine | Admitting: Emergency Medicine

## 2014-02-12 DIAGNOSIS — J029 Acute pharyngitis, unspecified: Secondary | ICD-10-CM

## 2014-02-12 LAB — POCT RAPID STREP A: Streptococcus, Group A Screen (Direct): NEGATIVE

## 2014-02-12 MED ORDER — TRAMADOL HCL 50 MG PO TABS: 50.0000 mg | ORAL_TABLET | Freq: Four times a day (QID) | ORAL | Status: DC | PRN

## 2014-02-12 NOTE — ED Provider Notes (Signed)
Medical screening examination/treatment/procedure(s) were performed by non-physician practitioner and as supervising physician I was immediately available for consultation/collaboration.  Madalen Gavin, M.D.  Abdishakur Gottschall C Shyheim Tanney, MD 02/12/14 2020 

## 2014-02-12 NOTE — ED Provider Notes (Signed)
CSN: [id number]     Arrival date & time 02/12/14  1821 History   First MD Initiated Contact with Patient 02/12/14 1851     Chief Complaint  Patient presents with  . Sore Throat   (Consider location/radiation/quality/duration/timing/severity/associated sxs/prior Treatment) HPI Comments: 23 year old female presents complaining of sore throat and intermittent right-sided neck lymph node swelling over the past 3 days. Her symptoms have been intermittent. She admits to headache, one episode of nausea, and fatigue as well. No fever, chest pain, shortness of breath, vomiting, diarrhea, abdominal pains.  Patient is a 23 y.o. female presenting with pharyngitis.  Sore Throat Pertinent negatives include no chest pain and no shortness of breath.    Past Medical History  Diagnosis Date  . Gallstones    Past Surgical History  Procedure Laterality Date  . Dilation and curettage of uterus    . Wisdom tooth extraction    . Cholecystectomy N/A 12/04/2013    Procedure: LAPAROSCOPIC CHOLECYSTECTOMY;  Surgeon: Axel FillerArmando Ramirez, MD;  [facility];  Service: General;  Laterality: N/A;   Family History  Problem Relation Age of Onset  . Hypertension Mother   . Diabetes Mother   . Diabetes Maternal Grandmother    History  Substance Use Topics  . Smoking status: Former Smoker -- 2 years    Quit date: 01/12/2011  . Smokeless tobacco: Never Used  . Alcohol Use: No   OB History   Grav Para Term Preterm Abortions TAB SAB Ect Mult Living   2 1 1  1     1      Review of Systems  Constitutional: Negative for fever, chills and fatigue.  HENT: Positive for sore throat and trouble swallowing. Negative for ear pain, rhinorrhea and sinus pressure.   Respiratory: Positive for cough. Negative for shortness of breath.   Cardiovascular: Negative for chest pain.  Gastrointestinal: Positive for nausea. Negative for vomiting and diarrhea.  Skin: Negative for rash.  Hematological: Positive for adenopathy.  All  other systems reviewed and are negative.   Allergies  Flagyl  Home Medications   Prior to Admission medications   Medication Sig Start Date End Date Taking? Authorizing Provider  traMADol (ULTRAM) 50 MG tablet Take 1 tablet (50 mg total) by mouth every 6 (six) hours as needed. 02/12/14   Adrian BlackwaterZachary H Erza Mothershead, PA-C   BP 111/60  Pulse 84  Temp(Src) 98.9 F (37.2 C) (Oral)  Resp 14  SpO2 100%  LMP 01/26/2014  Breastfeeding? No Physical Exam  Nursing note and vitals reviewed. Constitutional: She is oriented to person, place, and time. Vital signs are normal. She appears well-developed and well-nourished. No distress.  HENT:  Head: Normocephalic and atraumatic.  Right Ear: Tympanic membrane, external ear and ear canal normal.  Left Ear: Tympanic membrane, external ear and ear canal normal.  Nose: Nose normal. Right sinus exhibits no maxillary sinus tenderness and no frontal sinus tenderness. Left sinus exhibits no maxillary sinus tenderness and no frontal sinus tenderness.  Mouth/Throat: Uvula is midline, oropharynx is clear and moist and mucous membranes are normal. No oropharyngeal exudate.  Eyes: Conjunctivae are normal. Right eye exhibits no discharge. Left eye exhibits no discharge.  Neck: Normal range of motion. Neck supple. No JVD present. No tracheal deviation present.  Cardiovascular: Normal rate, regular rhythm and normal heart sounds.   Pulmonary/Chest: Effort normal and breath sounds normal. No respiratory distress.  Abdominal: Soft.  Lymphadenopathy:    She has no cervical adenopathy.  Neurological: She is alert and oriented  to person, place, and time. She has normal strength. Coordination normal.  Skin: Skin is warm and dry. No rash noted. She is not diaphoretic.  Psychiatric: She has a normal mood and affect. Judgment normal.    ED Course  Procedures (including critical care time) Labs Review Labs Reviewed  POCT RAPID STREP A ([facility]RG CARE ONLY)    Imaging Review No  results found.   MDM   1. Viral pharyngitis    Exam is normal. Most likely viral URI. Treat symptomatically, followup when necessary.  Meds ordered this encounter  Medications  . traMADol (ULTRAM) 50 MG tablet    Sig: Take 1 tablet (50 mg total) by mouth every 6 (six) hours as needed.    Dispense:  10 tablet    Refill:  0    Order Specific Question:  Supervising Provider    Answer:  Bradd CanaryKINDL, JAMES D [[id number]]       Graylon GoodZachary H Tareka Jhaveri, PA-C 02/12/14 2000

## 2014-02-12 NOTE — Discharge Instructions (Signed)
Take OTC Advil allergy sinus during the day and OTC NyQuil at night for symptoms  Antibiotic Nonuse  Your caregiver felt that the infection or problem was not one that would be helped with an antibiotic. Infections may be caused by viruses or bacteria. Only a caregiver can tell which one of these is the likely cause of an illness. A cold is the most common cause of infection in both adults and children. A cold is a virus. Antibiotic treatment will have no effect on a viral infection. Viruses can lead to many lost days of work caring for sick children and many missed days of school. Children may catch as many as 10 "colds" or "flus" per year during which they can be tearful, cranky, and uncomfortable. The goal of treating a virus is aimed at keeping the ill person comfortable. Antibiotics are medications used to help the body fight bacterial infections. There are relatively few types of bacteria that cause infections but there are hundreds of viruses. While both viruses and bacteria cause infection they are very different types of germs. A viral infection will typically go away by itself within 7 to 10 days. Bacterial infections may spread or get worse without antibiotic treatment. Examples of bacterial infections are:  Sore throats (like strep throat or tonsillitis).  Infection in the lung (pneumonia).  Ear and skin infections. Examples of viral infections are:  Colds or flus.  Most coughs and bronchitis.  Sore throats not caused by Strep.  Runny noses. It is often best not to take an antibiotic when a viral infection is the cause of the problem. Antibiotics can kill off the helpful bacteria that we have inside our body and allow harmful bacteria to start growing. Antibiotics can cause side effects such as allergies, nausea, and diarrhea without helping to improve the symptoms of the viral infection. Additionally, repeated uses of antibiotics can cause bacteria inside of our body to become  resistant. That resistance can be passed onto harmful bacterial. The next time you have an infection it may be harder to treat if antibiotics are used when they are not needed. Not treating with antibiotics allows our own immune system to develop and take care of infections more efficiently. Also, antibiotics will work better for us when they are prescribed for bacterial infections. Treatments for a child that is ill may include:  Give extra fluids throughout the day to stay hydrated.  Get plenty of rest.  Only give your child over-the-counter or prescription medicines for pain, discomfort, or fever as directed by your caregiver.  The use of a cool mist humidifier may help stuffy noses.  Cold medications if suggested by your caregiver. Your caregiver may decide to start you on an antibiotic if:  The problem you were seen for today continues for a longer length of time than expected.  You develop a secondary bacterial infection. SEEK MEDICAL CARE IF:  Fever lasts longer than 5 days.  Symptoms continue to get worse after 5 to 7 days or become severe.  Difficulty in breathing develops.  Signs of dehydration develop (poor drinking, rare urinating, dark colored urine).  Changes in behavior or worsening tiredness (listlessness or lethargy). Document Released: 10/04/2001 Document Revised: 10/18/2011 Document Reviewed: 04/02/2009 [facility] Patient Information 2015 [place], [place]. This information is not intended to replace advice given to you by your health care provider. Make sure you discuss any questions you have with your health care provider.  Pharyngitis Pharyngitis is redness, pain, and swelling (inflammation) of your  pharynx.  CAUSES  Pharyngitis is usually caused by infection. Most of the time, these infections are from viruses (viral) and are part of a cold. However, sometimes pharyngitis is caused by bacteria (bacterial). Pharyngitis can also be caused by allergies. Viral  pharyngitis may be spread from person to person by coughing, sneezing, and personal items or utensils (cups, forks, spoons, toothbrushes). Bacterial pharyngitis may be spread from person to person by more intimate contact, such as kissing.  SIGNS AND SYMPTOMS  Symptoms of pharyngitis include:   Sore throat.   Tiredness (fatigue).   Low-grade fever.   Headache.  Joint pain and muscle aches.  Skin rashes.  Swollen lymph nodes.  Plaque-like film on throat or tonsils (often seen with bacterial pharyngitis). DIAGNOSIS  Your health care provider will ask you questions about your illness and your symptoms. Your medical history, along with a physical exam, is often all that is needed to diagnose pharyngitis. Sometimes, a rapid strep test is done. Other lab tests may also be done, depending on the suspected cause.  TREATMENT  Viral pharyngitis will usually get better in 3-4 days without the use of medicine. Bacterial pharyngitis is treated with medicines that kill germs (antibiotics).  HOME CARE INSTRUCTIONS   Drink enough water and fluids to keep your urine clear or pale yellow.   Only take over-the-counter or prescription medicines as directed by your health care provider:   If you are prescribed antibiotics, make sure you finish them even if you start to feel better.   Do not take aspirin.   Get lots of rest.   Gargle with 8 oz of salt water ( tsp of salt per 1 qt of water) as often as every 1-2 hours to soothe your throat.   Throat lozenges (if you are not at risk for choking) or sprays may be used to soothe your throat. SEEK MEDICAL CARE IF:   You have large, tender lumps in your neck.  You have a rash.  You cough up green, yellow-brown, or bloody spit. SEEK IMMEDIATE MEDICAL CARE IF:   Your neck becomes stiff.  You drool or are unable to swallow liquids.  You vomit or are unable to keep medicines or liquids down.  You have severe pain that does not go away  with the use of recommended medicines.  You have trouble breathing (not caused by a stuffy nose). MAKE SURE YOU:   Understand these instructions.  Will watch your condition.  Will get help right away if you are not doing well or get worse. Document Released: 07/26/2005 Document Revised: 05/16/2013 Document Reviewed: 04/02/2013 [facility] Patient Information 2015 [place], [place]. This information is not intended to replace advice given to you by your health care provider. Make sure you discuss any questions you have with your health care provider.

## 2014-02-12 NOTE — ED Notes (Signed)
Pt c/o sore throat onset yest Sx include left submandibular lymph swelling, HA, weakness, nauseas Denies f/v/d Alert w/no signs of acute distress.

## 2014-02-14 LAB — CULTURE, GROUP A STREP

## 2014-02-22 ENCOUNTER — Ambulatory Visit: Payer: Medicaid Other | Admitting: Advanced Practice Midwife

## 2014-03-20 ENCOUNTER — Ambulatory Visit: Payer: Medicaid Other | Admitting: Obstetrics

## 2014-06-10 ENCOUNTER — Encounter (HOSPITAL_COMMUNITY): Payer: Self-pay | Admitting: Emergency Medicine

## 2014-09-01 IMAGING — US US ABDOMEN COMPLETE
1 series · 14 of 25 positions shown · non-contrast
Comparison: None.

CLINICAL DATA: Abdominal pain. nausea and vomiting. Diarrhea. 28
weeks pregnant.

EXAM:
ULTRASOUND ABDOMEN COMPLETE

[Series 1: us abdomen complete · 14 of 71 slices shown]
[im 1/71]
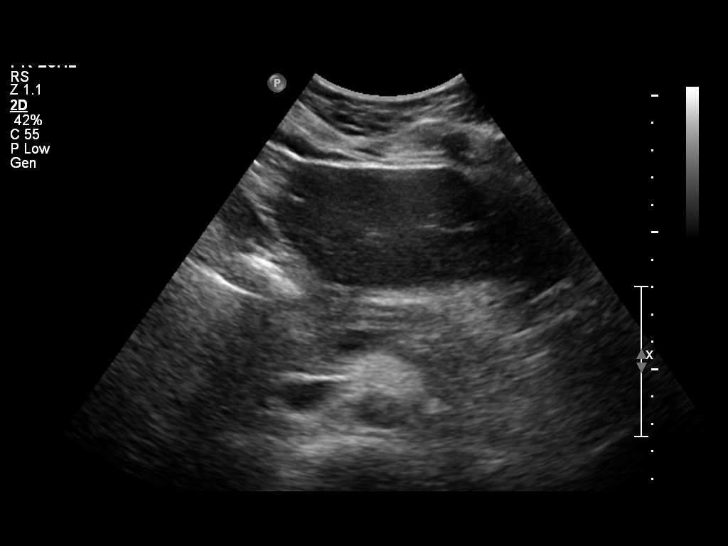
[im 6/71]
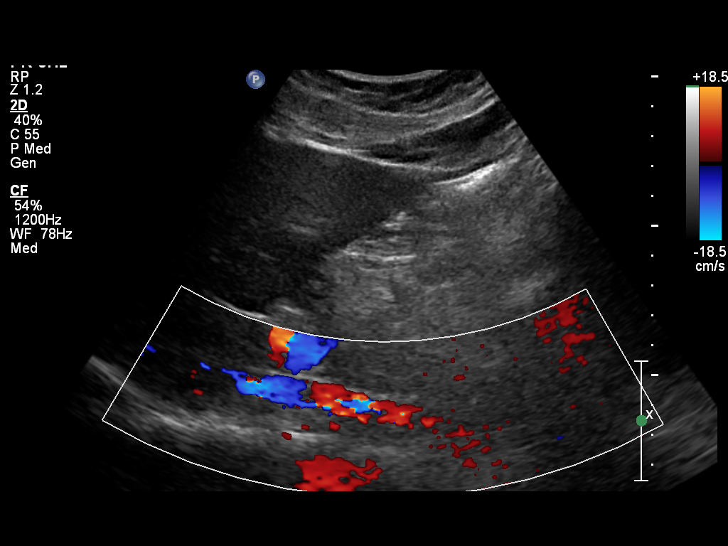
[im 12/71]
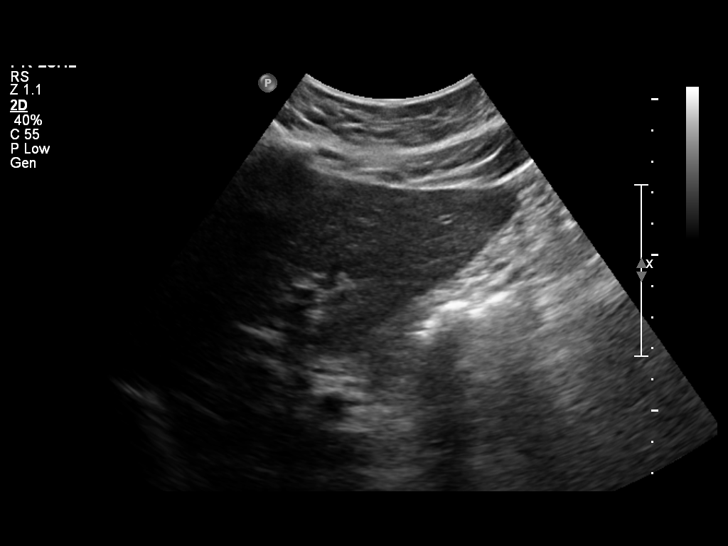
[im 18/71]
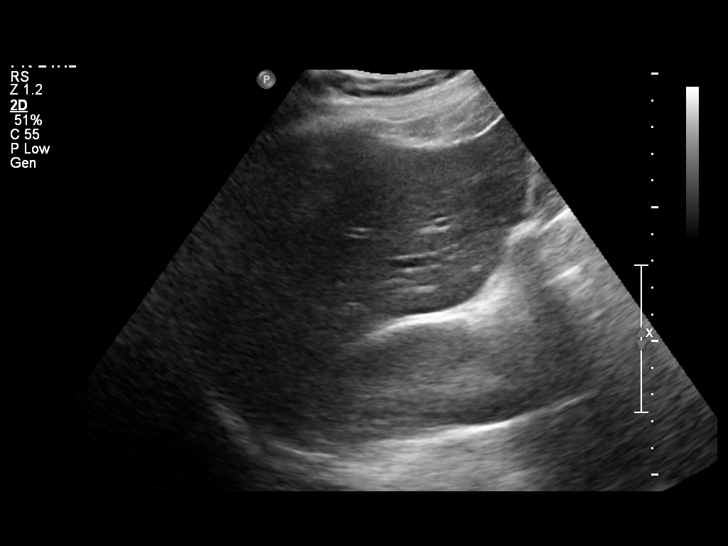
[im 24/71]
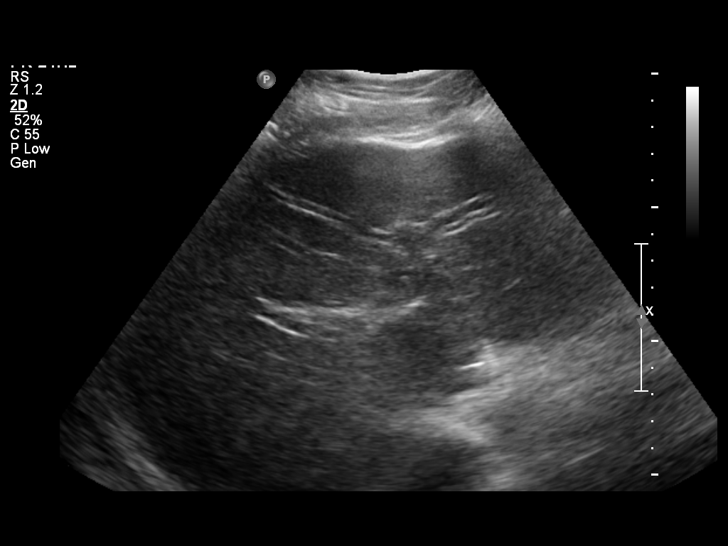
[im 27/71]
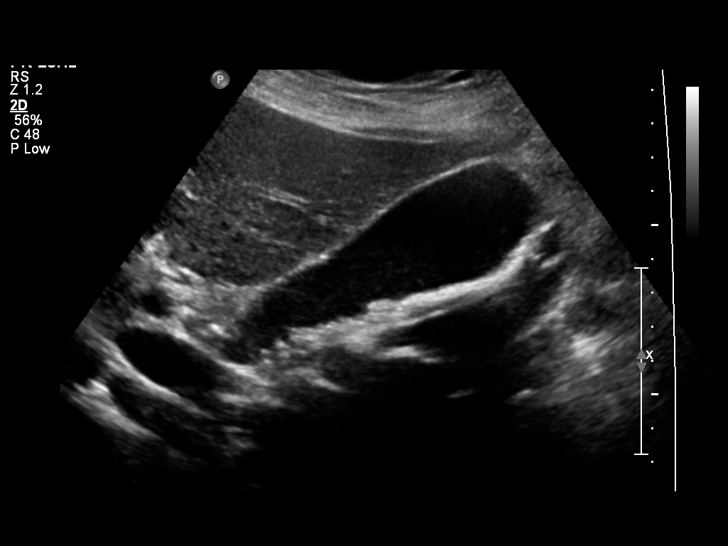
[im 33/71]
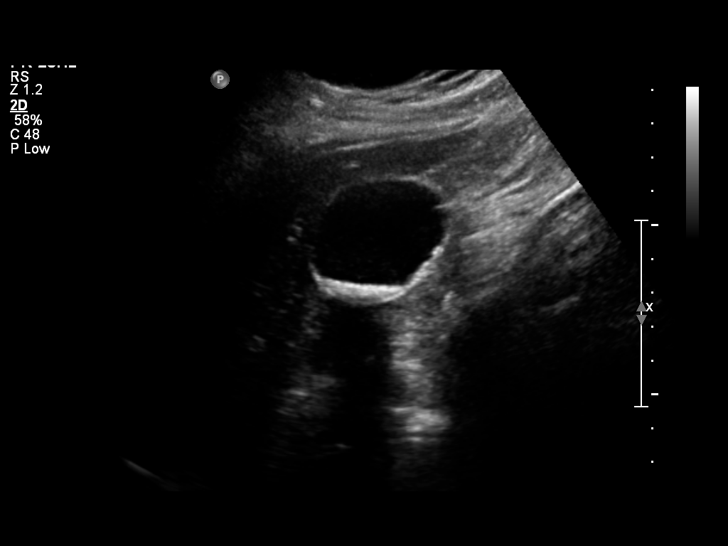
[im 38/71]
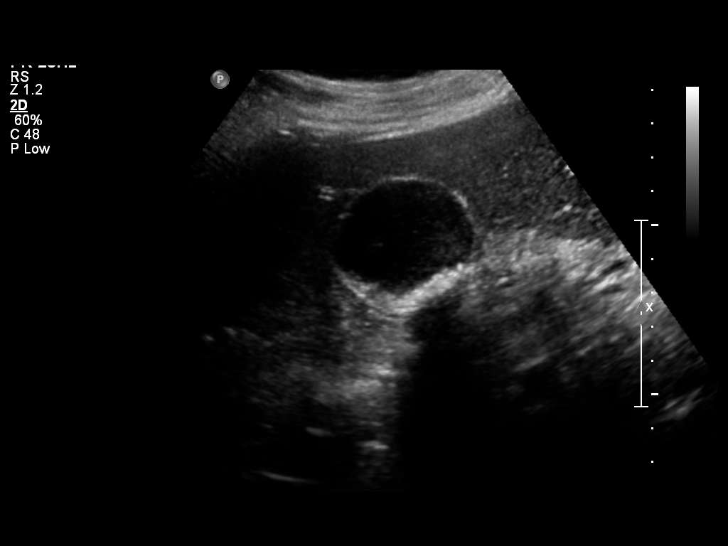
[im 44/71]
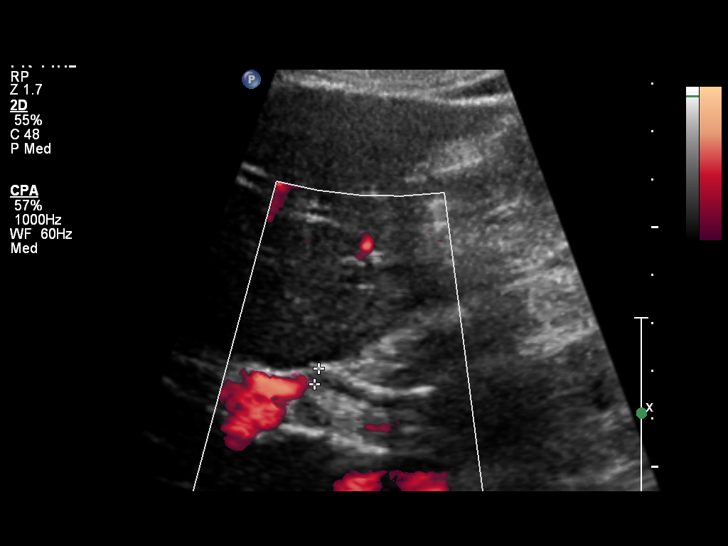
[im 47/71]
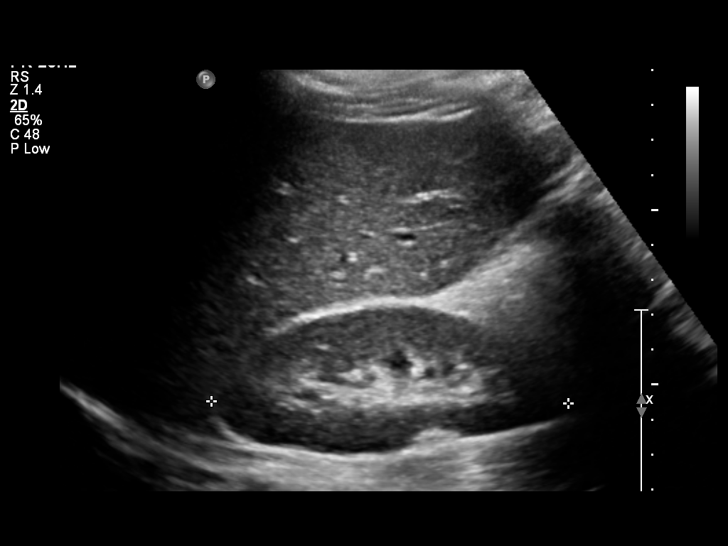
[im 53/71]
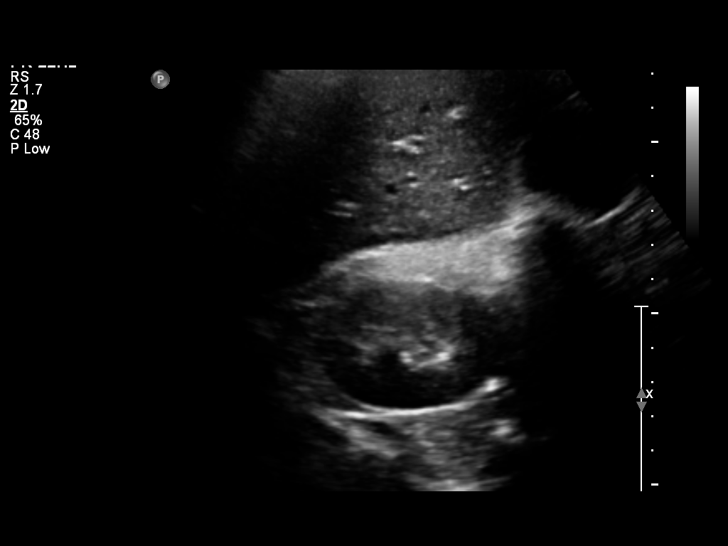
[im 59/71]
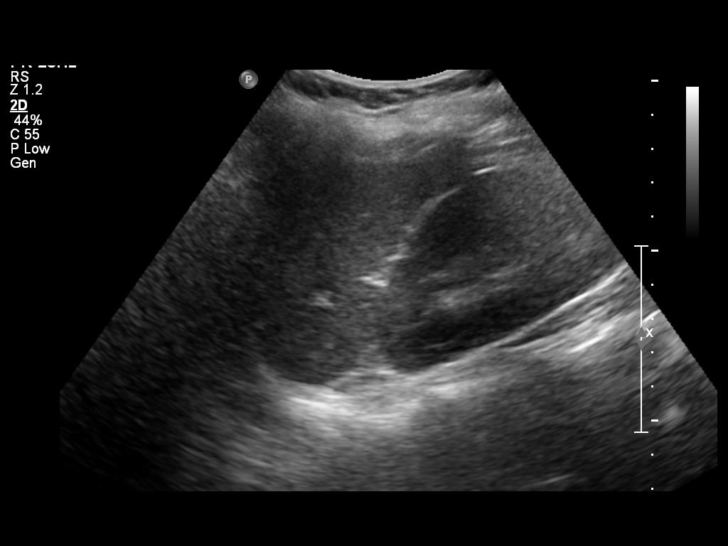
[im 65/71]
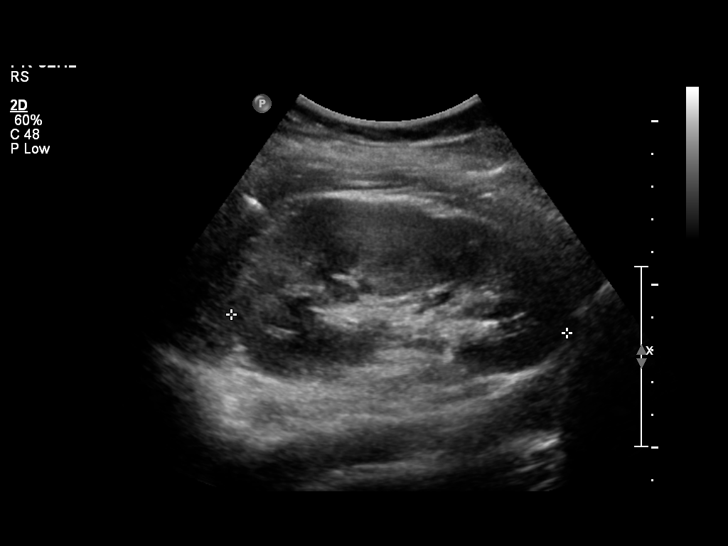
[im 71/71]
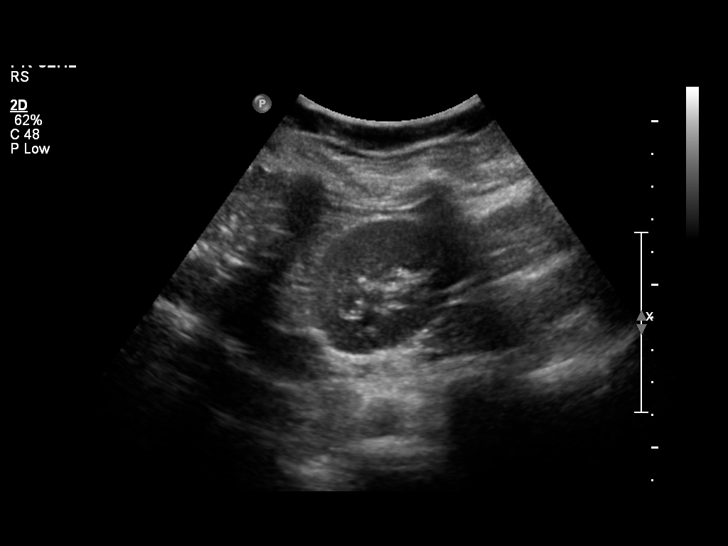

[14 of 25 positions shown; findings below may reference images not displayed]

FINDINGS: Gallbladder

Numerous tiny gallstones noted as well as gallbladder sludge. No
evidence of gallbladder wall thickening or pericholecystic fluid.

Common bile duct

Diameter: 3 mm

Liver

No focal lesion identified. Within normal limits in parenchymal
echogenicity.

IVC

No abnormality visualized.

Pancreas

Visualized portion unremarkable.

Spleen

Size and appearance within normal limits.

Right Kidney

Length: 10.2 cm. Echogenicity within normal limits. No mass or
hydronephrosis visualized.

Left Kidney

Length: 10.3 cm. Echogenicity within normal limits. No mass or
hydronephrosis visualized.

Abdominal aorta

No aneurysm visualized.
IMPRESSION: Cholelithiasis, without sonographic signs of acute cholecystitis or
biliary dilatation.

No evidence of hydronephrosis or other acute findings.

## 2014-10-10 ENCOUNTER — Ambulatory Visit: Payer: Medicaid Other | Admitting: Certified Nurse Midwife

## 2015-01-16 ENCOUNTER — Encounter (HOSPITAL_COMMUNITY): Payer: Self-pay | Admitting: *Deleted

## 2015-01-16 ENCOUNTER — Inpatient Hospital Stay (HOSPITAL_COMMUNITY)
Admission: AD | Admit: 2015-01-16 | Discharge: 2015-01-16 | Disposition: A | Discharge status: Home / Self Care | Payer: Medicaid Other | Source: Ambulatory Visit | Attending: Obstetrics | Admitting: Obstetrics

## 2015-01-16 DIAGNOSIS — L0231 Cutaneous abscess of buttock: Secondary | ICD-10-CM | POA: Diagnosis not present

## 2015-01-16 DIAGNOSIS — K5900 Constipation, unspecified: Secondary | ICD-10-CM | POA: Insufficient documentation

## 2015-01-16 DIAGNOSIS — Z87891 Personal history of nicotine dependence: Secondary | ICD-10-CM | POA: Insufficient documentation

## 2015-01-16 DIAGNOSIS — B9689 Other specified bacterial agents as the cause of diseases classified elsewhere: Secondary | ICD-10-CM | POA: Insufficient documentation

## 2015-01-16 DIAGNOSIS — N760 Acute vaginitis: Secondary | ICD-10-CM | POA: Insufficient documentation

## 2015-01-16 LAB — CBC
HEMATOCRIT: 37.8 % (ref 36.0–46.0)
HEMOGLOBIN: 12.5 g/dL (ref 12.0–15.0)
MCH: 29.8 pg (ref 26.0–34.0)
MCHC: 33.1 g/dL (ref 30.0–36.0)
MCV: 90 fL (ref 78.0–100.0)
PLATELETS: 256 10*3/uL (ref 150–400)
RBC: 4.2 MIL/uL (ref 3.87–5.11)
RDW: 13.5 % (ref 11.5–15.5)
WBC: 10.4 10*3/uL (ref 4.0–10.5)

## 2015-01-16 LAB — WET PREP, GENITAL
Trich, Wet Prep: NONE SEEN
Yeast Wet Prep HPF POC: NONE SEEN

## 2015-01-16 LAB — URINALYSIS, ROUTINE W REFLEX MICROSCOPIC
Bilirubin Urine: NEGATIVE
Glucose, UA: NEGATIVE mg/dL
Hgb urine dipstick: NEGATIVE
KETONES UR: NEGATIVE mg/dL
Leukocytes, UA: NEGATIVE
Nitrite: NEGATIVE
PROTEIN: NEGATIVE mg/dL
SPECIFIC GRAVITY, URINE: 1.02 (ref 1.005–1.030)
Urobilinogen, UA: 1 mg/dL (ref 0.0–1.0)
pH: 7 (ref 5.0–8.0)

## 2015-01-16 LAB — POCT PREGNANCY, URINE: Preg Test, Ur: NEGATIVE

## 2015-01-16 MED ORDER — OXYCODONE-ACETAMINOPHEN 5-325 MG PO TABS: 1.0000 | ORAL_TABLET | Freq: Four times a day (QID) | ORAL | Status: DC | PRN

## 2015-01-16 MED ORDER — METRONIDAZOLE 0.75 % VA GEL: 1.0000 | Freq: Every day | VAGINAL | Status: DC

## 2015-01-16 MED ORDER — OXYCODONE-ACETAMINOPHEN 5-325 MG PO TABS
1.0000 | ORAL_TABLET | Freq: Once | ORAL | Status: AC
  Administered 2015-01-16: 1 via ORAL
  Filled 2015-01-16: qty 1

## 2015-01-16 MED ORDER — IBUPROFEN 600 MG PO TABS
600.0000 mg | ORAL_TABLET | Freq: Once | ORAL | Status: AC
  Administered 2015-01-16: 600 mg via ORAL
  Filled 2015-01-16: qty 1

## 2015-01-16 MED ORDER — IBUPROFEN 600 MG PO TABS: 600.0000 mg | ORAL_TABLET | Freq: Four times a day (QID) | ORAL | Status: DC | PRN

## 2015-01-16 NOTE — MAU Note (Signed)
Pt c/o boil on her left buttocks x2 days.

## 2015-01-16 NOTE — MAU Provider Note (Signed)
Chief Complaint: Cyst   First Provider Initiated Contact with Patient 01/16/15 1207      SUBJECTIVE HPI: Jade Brown is a 24 y.o. [id number]  female who [facility] reporting painful mass on left buttock since 2 days ago and possible enlarged , tender lymph nodes in left groin. Rates pain in both a 10/10  On pain scale. Describes it as dull, throbbing.  Also reports increase malodorous yellow vaginal discharge for 2 weeks and constipation.  Has not seen primary care provider or gynecologist for any of these issues. She is a patient of Dr. Clearance Coots, but couldn't wait to be seen. Does not have a primary care provider.  Denies fever, chills.   Past Medical History  Diagnosis Date  . Gallstones    OB History  Gravida Para Term Preterm AB SAB TAB Ectopic Multiple Living  2 1 1  1  1   1     # Outcome Date GA Lbr Len/2nd Weight Sex Delivery Anes PTL Lv  2 Term 09/03/13 [redacted]w[redacted]d 04:22 / 01:05 7 lb 1.8 oz (3.225 kg) Genella Mech EPI  Y  1 TAB 05/09/10 [redacted]w[redacted]d            Past Surgical History  Procedure Laterality Date  . Dilation and curettage of uterus    . Wisdom tooth extraction    . Cholecystectomy N/A 12/04/2013    Procedure: LAPAROSCOPIC CHOLECYSTECTOMY;  Surgeon: Axel Filler, MD;  Location: [facility] OR;  Service: General;  Laterality: N/A;   History   Social History  . Marital Status: Single    Spouse Name: N/A  . Number of Children: N/A  . Years of Education: N/A   Occupational History  . Not on file.   Social History Main Topics  . Smoking status: Former Smoker -- 2 years    Quit date: 01/12/2011  . Smokeless tobacco: Never Used  . Alcohol Use: No  . Drug Use: No  . Sexual Activity:    Partners: Male    Birth Control/ Protection: Implant   Other Topics Concern  . Not on file   Social History Narrative   No current facility-administered medications on file prior to encounter.   Current Outpatient Prescriptions on File Prior to Encounter  Medication Sig  Dispense Refill  . traMADol (ULTRAM) 50 MG tablet Take 1 tablet (50 mg total) by mouth every 6 (six) hours as needed. 10 tablet 0   Allergies  Allergen Reactions  . Flagyl [Metronidazole] Nausea And Vomiting    Review of Systems  Constitutional: Negative for fever and chills.  Gastrointestinal: Positive for constipation. Negative for nausea, vomiting, abdominal pain and diarrhea.  Genitourinary: Negative for dysuria, urgency, frequency, hematuria and flank pain.        Positive for vaginal discharge. Negative for vaginal bleeding  or dyspareunia.  Musculoskeletal: Negative for myalgias.  Skin: Negative for itching.        Positive for  tender mass on left buttock    OBJECTIVE Blood pressure 112/87, pulse 67, resp. rate 18, height 5\' 2"  (1.575 m), weight 186 lb 3.2 oz (84.46 kg), last menstrual period 12/29/2014, not currently breastfeeding. Temp 97.9.   GENERAL: Well-developed, well-nourished female in  mild distress.  Unable to sit up due to pain in buttock. HEART: normal rate RESP: normal effort GI: Abdomen soft, non-tender.  MS:  5 cm extremely tender , non-fluctuant mass in mid, left buttock. Mild erythema although exam limited by pain patients dark complexion.  No drainage or  bleeding. NEURO: Alert and oriented SPECULUM EXAM: NEFG,  Moderate amount of thin, white, malodorous discharge, no blood noted, cervix clean,  Non-friable. BIMANUAL: cervix  closed; uterus normal size, no adnexal tenderness or masses.  No cervical motion tenderness.  LAB RESULTS Results for orders placed or performed during the hospital encounter of 01/16/15 (from the past 168 hour(s))  GC/Chlamydia probe amp ([place])not at [facility]   Collection Time: 01/16/15 12:00 AM  Result Value Ref Range   Chlamydia Negative    Neisseria gonorrhea Negative   Urinalysis, Routine w reflex microscopic (not at [facility])   Collection Time: 01/16/15 11:05 AM  Result Value Ref Range   Color, Urine YELLOW YELLOW    APPearance CLEAR CLEAR   Specific Gravity, Urine 1.020 1.005 - 1.030   pH 7.0 5.0 - 8.0   Glucose, UA NEGATIVE NEGATIVE mg/dL   Hgb urine dipstick NEGATIVE NEGATIVE   Bilirubin Urine NEGATIVE NEGATIVE   Ketones, ur NEGATIVE NEGATIVE mg/dL   Protein, ur NEGATIVE NEGATIVE mg/dL   Urobilinogen, UA 1.0 0.0 - 1.0 mg/dL   Nitrite NEGATIVE NEGATIVE   Leukocytes, UA NEGATIVE NEGATIVE  Pregnancy, urine POC   Collection Time: 01/16/15 11:28 AM  Result Value Ref Range   Preg Test, Ur NEGATIVE NEGATIVE  HIV antibody   Collection Time: 01/16/15 11:34 AM  Result Value Ref Range   HIV Screen 4th Generation wRfx Non Reactive Non Reactive  CBC   Collection Time: 01/16/15 11:34 AM  Result Value Ref Range   WBC 10.4 4.0 - 10.5 K/uL   RBC 4.20 3.87 - 5.11 MIL/uL   Hemoglobin 12.5 12.0 - 15.0 g/dL   HCT 16.1 09.6 - 04.5 %   MCV 90.0 78.0 - 100.0 fL   MCH 29.8 26.0 - 34.0 pg   MCHC 33.1 30.0 - 36.0 g/dL   RDW 40.9 81.1 - 91.4 %   Platelets 256 150 - 400 K/uL  Wet prep, genital   Collection Time: 01/16/15 11:51 AM  Result Value Ref Range   Yeast Wet Prep HPF POC NONE SEEN NONE SEEN   Trich, Wet Prep NONE SEEN NONE SEEN   Clue Cells Wet Prep HPF POC MANY (A) NONE SEEN   WBC, Wet Prep HPF POC FEW (A) NONE SEEN     IMAGING No results found.  MAU COURSE CBC, wet prep , GC/chlamydia cultures.  Percocet given.  Pain improved significantly.  ASSESSMENT 1. Abscess of buttock   2. BV (bacterial vaginosis)   3.       Constipation   PLAN Discharge home in stable condition.  follow up with urgent care or ED for fever or worsening pain and redness.   take ibuprofen every 6 hours around-the-clock and Percocet as needed for pain. Soaked in Epsom salts bath or  apply warm compress at least 5 times per day to aid drainage.   increase fluids and fiber. Colace as needed. Follow-up Information    Follow up with Primary care provider.   Why:  As needed if no improvement in 3 days      Follow up  with [facility] [place].   Why:  As needed if symptoms worsen   Contact information:   [address] [place] [address] [phone]        Medication List    STOP taking these medications        traMADol 50 MG tablet  Commonly known as:  Janean Sark  TAKE these medications        ibuprofen 600 MG tablet  Commonly known as:  ADVIL,MOTRIN  Take 1 tablet (600 mg total) by mouth every 6 (six) hours as needed for moderate pain.     metroNIDAZOLE 0.75 % vaginal gel  Commonly known as:  METROGEL  Place 1 Applicatorful vaginally at bedtime. Apply one applicatorful to vagina at bedtime for 5 days     oxyCODONE-acetaminophen 5-325 MG per tablet  Commonly known as:  PERCOCET/ROXICET  Take 1-2 tablets by mouth every 6 (six) hours as needed.       [place], CNM 01/16/2015  11:31 AM

## 2015-01-16 NOTE — MAU Note (Signed)
C/O painful boil on left gluteus since 2 days ago. Pain in left groin (lymph node?) area. Rates pain in both a 10of10. C/O abnormal, foul smelling, yellow vaginal discharge for 2 weeks. Says BM's have been less frequent and more difficult in the past week.

## 2015-01-16 NOTE — Discharge Instructions (Signed)
Abscess An abscess is an infected area that contains a collection of pus and debris.It can occur in almost any part of the body. An abscess is also known as a furuncle or boil. CAUSES  An abscess occurs when tissue gets infected. This can occur from blockage of oil or sweat glands, infection of hair follicles, or a minor injury to the skin. As the body tries to fight the infection, pus collects in the area and creates pressure under the skin. This pressure causes pain. People with weakened immune systems have difficulty fighting infections and get certain abscesses more often.  SYMPTOMS Usually an abscess develops on the skin and becomes a painful mass that is red, warm, and tender. If the abscess forms under the skin, you may feel a moveable soft area under the skin. Some abscesses break open (rupture) on their own, but most will continue to get worse without care. The infection can spread deeper into the body and eventually into the bloodstream, causing you to feel ill.  DIAGNOSIS  Your caregiver will take your medical history and perform a physical exam. A sample of fluid may also be taken from the abscess to determine what is causing your infection. TREATMENT  Your caregiver may prescribe antibiotic medicines to fight the infection. However, taking antibiotics alone usually does not cure an abscess. Your caregiver may need to make a small cut (incision) in the abscess to drain the pus. In some cases, gauze is packed into the abscess to reduce pain and to continue draining the area. Soak in Epsom salts bath or apply warm compresses to the area at least 5 times per day. Follow-up with your primary care provider if no improvement in 3 days. HOME CARE INSTRUCTIONS   Only take over-the-counter or prescription medicines for pain, discomfort, or fever as directed by your caregiver.  If you were prescribed antibiotics, take them as directed. Finish them even if you start to feel better.  If gauze is  used, follow your caregiver's directions for changing the gauze.  To avoid spreading the infection:  Keep your draining abscess covered with a bandage.  Wash your hands well.  Do not share personal care items, towels, or whirlpools with others.  Avoid skin contact with others.  Keep your skin and clothes clean around the abscess.  Keep all follow-up appointments as directed by your caregiver. SEEK MEDICAL CARE IF:   You have increased pain, swelling, redness, fluid drainage, or bleeding.  You have muscle aches, chills, or a general ill feeling.  You have a fever. MAKE SURE YOU:   Understand these instructions.  Will watch your condition.  Will get help right away if you are not doing well or get worse. Document Released: 05/05/2005 Document Revised: 01/25/2012 Document Reviewed: 10/08/2011 [facility] Patient Information 2015 [place], [place]. This information is not intended to replace advice given to you by your health care provider. Make sure you discuss any questions you have with your health care provider.  Bacterial Vaginosis Bacterial vaginosis is a vaginal infection that occurs when the normal balance of bacteria in the vagina is disrupted. It results from an overgrowth of certain bacteria. This is the most common vaginal infection in women of childbearing age. Treatment is important to prevent complications, especially in pregnant women, as it can cause a premature delivery. CAUSES  Bacterial vaginosis is caused by an increase in harmful bacteria that are normally present in smaller amounts in the vagina. Several different kinds of bacteria can cause bacterial vaginosis.  However, the reason that the condition develops is not fully understood. RISK FACTORS Certain activities or behaviors can put you at an increased risk of developing bacterial vaginosis, including:  Having a new sex partner or multiple sex partners.  Douching.  Using an intrauterine device (IUD) for  contraception. Women do not get bacterial vaginosis from toilet seats, bedding, swimming pools, or contact with objects around them. SIGNS AND SYMPTOMS  Some women with bacterial vaginosis have no signs or symptoms. Common symptoms include:  Grey vaginal discharge.  A fishlike odor with discharge, especially after sexual intercourse.  Itching or burning of the vagina and vulva.  Burning or pain with urination. DIAGNOSIS  Your health care provider will take a medical history and examine the vagina for signs of bacterial vaginosis. A sample of vaginal fluid may be taken. Your health care provider will look at this sample under a microscope to check for bacteria and abnormal cells. A vaginal pH test may also be done.  TREATMENT  Bacterial vaginosis may be treated with antibiotic medicines. These may be given in the form of a pill or a vaginal cream. A second round of antibiotics may be prescribed if the condition comes back after treatment.  HOME CARE INSTRUCTIONS   Only take over-the-counter or prescription medicines as directed by your health care provider.  If antibiotic medicine was prescribed, take it as directed. Make sure you finish it even if you start to feel better.  Do not have sex until treatment is completed.  Tell all sexual partners that you have a vaginal infection. They should see their health care provider and be treated if they have problems, such as a mild rash or itching.  Practice safe sex by using condoms and only having one sex partner. SEEK MEDICAL CARE IF:   Your symptoms are not improving after 3 days of treatment.  You have increased discharge or pain.  You have a fever. MAKE SURE YOU:   Understand these instructions.  Will watch your condition.  Will get help right away if you are not doing well or get worse. FOR MORE INFORMATION  Centers for Disease Control and Prevention, Division of STD Prevention: [employer]Apps.[employer].za American Sexual Health  Association (ASHA): www.ashastd.org  Document Released: 07/26/2005 Document Revised: 05/16/2013 Document Reviewed: 03/07/2013 [facility] Patient Information 2015 [place], [place]. This information is not intended to replace advice given to you by your health care provider. Make sure you discuss any questions you have with your health care provider.

## 2015-01-17 LAB — GC/CHLAMYDIA PROBE AMP (~~LOC~~) NOT AT ARMC
Chlamydia: NEGATIVE
Neisseria Gonorrhea: NEGATIVE

## 2015-01-17 LAB — HIV ANTIBODY (ROUTINE TESTING W REFLEX): HIV SCREEN 4TH GENERATION: NONREACTIVE

## 2015-02-14 ENCOUNTER — Emergency Department (HOSPITAL_COMMUNITY)
Admission: EM | Admit: 2015-02-14 | Discharge: 2015-02-14 | Disposition: A | Discharge status: Home / Self Care | Payer: Self-pay | Attending: Emergency Medicine | Admitting: Emergency Medicine

## 2015-02-14 ENCOUNTER — Encounter (HOSPITAL_COMMUNITY): Payer: Self-pay | Admitting: Emergency Medicine

## 2015-02-14 ENCOUNTER — Emergency Department (HOSPITAL_COMMUNITY): Payer: Self-pay

## 2015-02-14 DIAGNOSIS — R52 Pain, unspecified: Secondary | ICD-10-CM

## 2015-02-14 DIAGNOSIS — Z8719 Personal history of other diseases of the digestive system: Secondary | ICD-10-CM | POA: Insufficient documentation

## 2015-02-14 DIAGNOSIS — Z87891 Personal history of nicotine dependence: Secondary | ICD-10-CM | POA: Insufficient documentation

## 2015-02-14 DIAGNOSIS — M25571 Pain in right ankle and joints of right foot: Secondary | ICD-10-CM | POA: Insufficient documentation

## 2015-02-14 NOTE — Discharge Instructions (Signed)

## 2015-02-14 NOTE — ED Notes (Signed)
Left foot pain and swelling since Sunday per patient report.  Patient states "i thought I might have been bit by a spider", but denies seeing one.   Denies injury.

## 2015-02-14 NOTE — ED Notes (Signed)
Patient ambulated to restroom and tolerated well.  

## 2015-02-14 NOTE — ED Provider Notes (Signed)
CSN: [id number]     Arrival date & time 02/14/15  5284 History   First MD Initiated Contact with Patient 02/14/15 [phone]     Chief Complaint  Patient presents with  . Foot Pain   Patient is a 24 y.o. female presenting with lower extremity pain.  Foot Pain     24 year old female presents with foot pain for the last several days. She denies any specific injury, notes pain to the medial aspect of her left foot. No signs of swelling, no trauma. No previous injury to the ankle prior. Patient is able to ambulate with some pain. She has not tried over-the-counter therapies. Patient requests work note for today and tomorrow. No swelling to the distal extremity    Past Medical History  Diagnosis Date  . Gallstones    Past Surgical History  Procedure Laterality Date  . Dilation and curettage of uterus    . Wisdom tooth extraction    . Cholecystectomy N/A 12/04/2013    Procedure: LAPAROSCOPIC CHOLECYSTECTOMY;  Surgeon: Axel Filler, MD;  Location: [facility] OR;  Service: General;  Laterality: N/A;   Family History  Problem Relation Age of Onset  . Hypertension Mother   . Diabetes Mother   . Diabetes Maternal Grandmother    History  Substance Use Topics  . Smoking status: Former Smoker -- 2 years    Quit date: 01/12/2011  . Smokeless tobacco: Never Used  . Alcohol Use: No   OB History    Gravida Para Term Preterm AB TAB SAB Ectopic Multiple Living   Review of Systems  All other systems reviewed and are negative.   Allergies  Flagyl  Home Medications   Prior to Admission medications   Medication Sig Start Date End Date Taking? Authorizing Provider  ibuprofen (ADVIL,MOTRIN) 600 MG tablet Take 1 tablet (600 mg total) by mouth every 6 (six) hours as needed for moderate pain. 01/16/15   Dorathy Kinsman, CNM  metroNIDAZOLE (METROGEL) 0.75 % vaginal gel Place 1 Applicatorful vaginally at bedtime. Apply one applicatorful to vagina at bedtime for 5 days 01/16/15   Dorathy Kinsman, CNM  oxyCODONE-acetaminophen (PERCOCET/ROXICET) 5-325 MG per tablet Take 1-2 tablets by mouth every 6 (six) hours as needed. 01/16/15   Koleen Nimrod Smith, CNM   BP 119/73 mmHg  Pulse 76  Temp(Src) 98 F (36.7 C) (Oral)  Resp 16  SpO2 100%  LMP 01/17/2015 (Approximate) Physical Exam  Constitutional: She is oriented to person, place, and time. She appears well-developed and well-nourished.  HENT:  Head: Normocephalic and atraumatic.  Eyes: Conjunctivae are normal. Pupils are equal, round, and reactive to light. Right eye exhibits no discharge. Left eye exhibits no discharge. No scleral icterus.  Neck: Normal range of motion. No JVD present. No tracheal deviation present.  Pulmonary/Chest: Effort normal and breath sounds normal. No stridor. No respiratory distress. She has no wheezes. She has no rales. She exhibits no tenderness.  Musculoskeletal:  Tenderness to palpation of the medial ankle left no signs of swelling no signs of deformity, anterior drawer negative, inversion and eversion negative, sensation intact, pulses 2+, Refill less than 3 seconds, equal bilateral.  Neurological: She is alert and oriented to person, place, and time. Coordination normal.  Psychiatric: She has a normal mood and affect. Her behavior is normal. Judgment and thought content normal.  Nursing note and vitals reviewed.   ED Course  Procedures (including critical care time)  Labs Review Labs Reviewed - No data to display  Imaging Review Dg Foot Complete Left  02/14/2015   CLINICAL DATA:  Plantar left foot pain radiating into the lower leg. Symptoms started 02/09/2015. No known injury. Initial encounter.  EXAM: LEFT FOOT - COMPLETE 3+ VIEW  COMPARISON:  Plain films left foot 06/15/2005.  FINDINGS: Imaged bones, joints and soft tissues appear normal.  IMPRESSION: Negative exam.   Electronically Signed   By: Drusilla Kannerhomas  Dalessio M.D.   On: 02/14/2015 08:39     EKG Interpretation None      MDM   Final  diagnoses:  Ankle pain, right   Labs:   Imaging: DG foot- I personally reviewed the x-rays and agree with the radiologist's read- negative films  Consults:    Therapeutics:   Assessment:  Plan: Patient presents with ankle pain no history of trauma. No obvious signs of trauma, infection, swelling of the ankle. Patient requesting work note for today and tomorrow. Due to patient's benign exam, no findings on radiographs, patient will be discharged home with instructions to use ibuprofen or Tylenol as needed for pain. She is able to return to work. She is unlikely ankle sprain, infection, gout, or any other significant pathology.      Eyvonne MechanicJeffrey Rakiya Krawczyk, PA-C 02/14/15 1409  Bethann BerkshireJoseph Zammit, MD 02/15/15 [phone]

## 2015-10-28 ENCOUNTER — Ambulatory Visit: Payer: Medicaid Other | Admitting: Obstetrics

## 2018-08-09 NOTE — L&D Delivery Note (Signed)
Delivery Note At 9:23 AM a viable and healthy female was delivered via Vaginal, Spontaneous (Presentation: OA) w/ mom standing at [facility].  APGAR: 8, 9; weight Pending.   Placenta status: Spontaneous, intact.  Cord: 3VC  with the following complications: None. Delayed cord clamping for 8 minutes per family's request.  Cord pH: NA  NICU present due to heavy MSF, but infant w/ spontaneous vigorous cry at birth so baby stayed w/ Mom. Brief assessment by NICU NP at [facility].    Anesthesia: None  Episiotomy: None Lacerations: Superficial labial  Suture Repair: NA Est. Blood Loss (mL): 150  Mom to postpartum.  Baby to Couplet care / Skin to Skin.  Please schedule this patient for Postpartum visit in: 4 weeks with the following provider: Any provider For C/S patients schedule nurse incision check in weeks 2 weeks: NA Low risk pregnancy complicated by: Nothing Delivery mode:  SVD Anticipated Birth Control:  other/unsure PP Procedures needed: None  Schedule Integrated [place] visit: no  [place] 06/14/2019, 9:49 AM

## 2018-11-29 ENCOUNTER — Encounter: Payer: Self-pay | Admitting: Certified Nurse Midwife

## 2018-11-29 ENCOUNTER — Other Ambulatory Visit (HOSPITAL_COMMUNITY)
Admission: RE | Admit: 2018-11-29 | Discharge: 2018-11-29 | Disposition: A | Discharge status: Home / Self Care | Payer: Medicaid Other | Source: Ambulatory Visit | Attending: Certified Nurse Midwife | Admitting: Certified Nurse Midwife

## 2018-11-29 ENCOUNTER — Other Ambulatory Visit: Payer: Self-pay

## 2018-11-29 ENCOUNTER — Ambulatory Visit (INDEPENDENT_AMBULATORY_CARE_PROVIDER_SITE_OTHER): Payer: Medicaid Other | Admitting: Certified Nurse Midwife

## 2018-11-29 VITALS — BP 115/57 | HR 80 | Wt 180.0 lb

## 2018-11-29 DIAGNOSIS — Z3480 Encounter for supervision of other normal pregnancy, unspecified trimester: Secondary | ICD-10-CM | POA: Diagnosis present

## 2018-11-29 DIAGNOSIS — Z3A11 11 weeks gestation of pregnancy: Secondary | ICD-10-CM

## 2018-11-29 DIAGNOSIS — O26891 Other specified pregnancy related conditions, first trimester: Secondary | ICD-10-CM

## 2018-11-29 DIAGNOSIS — O360910 Maternal care for other rhesus isoimmunization, first trimester, not applicable or unspecified: Secondary | ICD-10-CM

## 2018-11-29 DIAGNOSIS — Z6791 Unspecified blood type, Rh negative: Secondary | ICD-10-CM

## 2018-11-29 DIAGNOSIS — Z3481 Encounter for supervision of other normal pregnancy, first trimester: Secondary | ICD-10-CM

## 2018-11-29 DIAGNOSIS — B3731 Acute candidiasis of vulva and vagina: Secondary | ICD-10-CM

## 2018-11-29 DIAGNOSIS — O219 Vomiting of pregnancy, unspecified: Secondary | ICD-10-CM

## 2018-11-29 DIAGNOSIS — O26899 Other specified pregnancy related conditions, unspecified trimester: Secondary | ICD-10-CM

## 2018-11-29 DIAGNOSIS — O98811 Other maternal infectious and parasitic diseases complicating pregnancy, first trimester: Secondary | ICD-10-CM

## 2018-11-29 DIAGNOSIS — B373 Candidiasis of vulva and vagina: Secondary | ICD-10-CM

## 2018-11-29 MED ORDER — ONDANSETRON 4 MG PO TBDP: 4.0000 mg | ORAL_TABLET | Freq: Three times a day (TID) | ORAL | 1 refills | Status: DC | PRN

## 2018-11-29 MED ORDER — PRENATE MINI 29-0.6-0.4-350 MG PO CAPS: 1.0000 | ORAL_CAPSULE | Freq: Every day | ORAL | 3 refills | Status: DC

## 2018-11-29 NOTE — Progress Notes (Signed)
History:   Delphina CahillSymone D Weatherall is a 28 y.o. G3P1011 at 7460w4d by LMP being seen today for her first obstetrical visit.  Her obstetrical history is significant for nothing. Patient does intend to breast feed. Pregnancy history fully reviewed.  Patient reports nausea and vomiting.      HISTORY: OB History  Gravida Para Term Preterm AB Living  3 1 1  0 1 1  SAB TAB Ectopic Multiple Live Births  0 1 0 0 1    # Outcome Date GA Lbr Len/2nd Weight Sex Delivery Anes PTL Lv  3 Current           2 Term 09/03/13 6839w6d 04:22 / 01:05 7 lb 1.8 oz (3.225 kg) M Vag-Spont EPI  LIV     Name: Schulenburg,BOY Chayse     Apgar1: 8  Apgar5: 9  1 TAB 05/09/10 7214w0d           Last pap smear was done 06/2018 and was normal per patient, completed at [facility]- records requested.   Past Medical History:  Diagnosis Date  . Gallstones    Past Surgical History:  Procedure Laterality Date  . CHOLECYSTECTOMY N/A 12/04/2013   Procedure: LAPAROSCOPIC CHOLECYSTECTOMY;  Surgeon: Axel FillerArmando Ramirez, MD;  Location: [facility] OR;  Service: General;  Laterality: N/A;  . DILATION AND CURETTAGE OF UTERUS    . WISDOM TOOTH EXTRACTION     Family History  Problem Relation Age of Onset  . Hypertension Mother   . Diabetes Mother   . Diabetes Maternal Grandmother    Social History   Tobacco Use  . Smoking status: Former Smoker    Years: 2.00    Last attempt to quit: 01/12/2011    Years since quitting: 7.8  . Smokeless tobacco: Never Used  Substance Use Topics  . Alcohol use: No  . Drug use: No   Allergies  Allergen Reactions  . Flagyl [Metronidazole] Nausea And Vomiting   Current Outpatient Medications on File Prior to Visit  Medication Sig Dispense Refill  . Prenatal Vit-Fe Fumarate-FA (MULTIVITAMIN-PRENATAL) 27-0.8 MG TABS tablet Take 1 tablet by mouth daily at 12 noon.    Marland Kitchen. ibuprofen (ADVIL,MOTRIN) 600 MG tablet Take 1 tablet (600 mg total) by mouth every 6 (six) hours as needed for moderate pain. (Patient not taking: Reported on  11/29/2018) 30 tablet 1  . metroNIDAZOLE (METROGEL) 0.75 % vaginal gel Place 1 Applicatorful vaginally at bedtime. Apply one applicatorful to vagina at bedtime for 5 days (Patient not taking: Reported on 11/29/2018) 70 g 1  . oxyCODONE-acetaminophen (PERCOCET/ROXICET) 5-325 MG per tablet Take 1-2 tablets by mouth every 6 (six) hours as needed. (Patient not taking: Reported on 11/29/2018) 30 tablet 0   No current facility-administered medications on file prior to visit.     Review of Systems Pertinent items noted in HPI and remainder of comprehensive ROS otherwise negative. Physical Exam:   Vitals:   11/29/18 0852  BP: (!) 115/57  Pulse: 80  Weight: 180 lb (81.6 kg)   Fetal Heart Rate (bpm): 154 Pelvic Exam: Perineum: no hemorrhoids, normal perineum   Vulva: normal external genitalia, no lesions  System: General: well-developed, well-nourished female in no acute distress   Breasts:  normal appearance, no masses or tenderness bilaterally   Skin: normal coloration and turgor, no rashes   Neurologic: oriented, normal, negative, normal mood   Extremities: normal strength, tone, and muscle mass, ROM of all joints is normal   HEENT PERRLA, extraocular movement intact and sclera clear  Mouth/Teeth mucous membranes moist, pharynx normal without lesions and dental hygiene good   Neck supple and no masses   Cardiovascular: regular rate and rhythm   Respiratory:  no respiratory distress, normal breath sounds   Abdomen: soft, non-tender; bowel sounds normal; no masses,  no organomegaly     Assessment:    Pregnancy: G3P1011 Patient Active Problem List   Diagnosis Date Noted  . Supervision of other normal pregnancy, antepartum 11/29/2018  . Nexplanon in place 12/14/2013  . Enteritis due to Norovirus 06/12/2013  . Gastroenteritis, acute 06/11/2013  . Abdominal pregnancy with intrauterine pregnancy-27w,5 day, EDD=1.27.15 06/10/2013  . Nausea 05/14/2013  . Rash and other nonspecific skin  eruption 05/14/2013  . Pain in joint of right knee 04/16/2013     Plan:    1. Supervision of other normal pregnancy, antepartum - Routine care  - Anticipatory guidance on appointments including telehealth and face to face visits - COVID 19 precautions  - Educated and discussed babyscripts with patient  - Genetic Screening - Culture, OB Urine - Babyscripts Schedule Optimization - Cervicovaginal ancillary only( [place]) - Prenat w/o A-FeCbn-Meth-FA-DHA (PRENATE MINI) 29-0.6-0.4-350 MG CAPS; Take 1 tablet by mouth daily.  Dispense: 60 capsule; Refill: 3 - [place] MFM OB COMP + 14 WK; Future - Obstetric Panel, Including HIV  2. Nausea and vomiting during pregnancy - patient reports daily morning sickness, currently does not have medication she is taking, request medication  - ondansetron (ZOFRAN ODT) 4 MG disintegrating tablet; Take 1 tablet (4 mg total) by mouth every 8 (eight) hours as needed for nausea or vomiting.  Dispense: 30 tablet; Refill: 1   Initial labs drawn. Continue prenatal vitamins. Genetic Screening discussed, NIPS: ordered. Ultrasound discussed; fetal anatomic survey: ordered. Problem list reviewed and updated. The nature of [facility] [facility] with multiple MDs and other Advanced Practice Providers was explained to patient; also emphasized that residents, students are part of our team. Routine obstetric precautions reviewed. Return in about 8 weeks (around 01/24/2019) for ROB (webex).     Sharyon Cable, CNM [facility] [employer], [facility]

## 2018-11-29 NOTE — Patient Instructions (Signed)

## 2018-11-29 NOTE — Progress Notes (Signed)
Pt is in the office for NOB. Pt does not want to know the sex of the baby. Pt denies pain today, reports nausea and vomiting. FOB is involved and they are living together. Patient states last pap done at [facility] at the end of 2019.

## 2018-11-30 LAB — OBSTETRIC PANEL, INCLUDING HIV
Antibody Screen: NEGATIVE
Basophils Absolute: 0.1 10*3/uL (ref 0.0–0.2)
Basos: 1 %
EOS (ABSOLUTE): 0.3 10*3/uL (ref 0.0–0.4)
Eos: 2 %
HIV Screen 4th Generation wRfx: NONREACTIVE
Hematocrit: 35.2 % (ref 34.0–46.6)
Hemoglobin: 11.5 g/dL (ref 11.1–15.9)
Hepatitis B Surface Ag: NEGATIVE
Immature Grans (Abs): 0 10*3/uL (ref 0.0–0.1)
Immature Granulocytes: 0 %
Lymphocytes Absolute: 2 10*3/uL (ref 0.7–3.1)
Lymphs: 15 %
MCH: 30.1 pg (ref 26.6–33.0)
MCHC: 32.7 g/dL (ref 31.5–35.7)
MCV: 92 fL (ref 79–97)
Monocytes Absolute: 0.8 10*3/uL (ref 0.1–0.9)
Monocytes: 6 %
Neutrophils Absolute: 10 10*3/uL — ABNORMAL HIGH (ref 1.4–7.0)
Neutrophils: 76 %
Platelets: 305 10*3/uL (ref 150–450)
RBC: 3.82 x10E6/uL (ref 3.77–5.28)
RDW: 12.8 % (ref 11.7–15.4)
RPR Ser Ql: NONREACTIVE
Rh Factor: NEGATIVE
Rubella Antibodies, IGG: 1.19 index (ref >0.99)
WBC: 13.2 10*3/uL — ABNORMAL HIGH (ref 3.4–10.8)

## 2018-11-30 LAB — CERVICOVAGINAL ANCILLARY ONLY
Bacterial vaginitis: POSITIVE — AB
Candida vaginitis: POSITIVE — AB
Chlamydia: NEGATIVE
Neisseria Gonorrhea: NEGATIVE
Trichomonas: NEGATIVE

## 2018-12-01 DIAGNOSIS — Z6791 Unspecified blood type, Rh negative: Secondary | ICD-10-CM | POA: Insufficient documentation

## 2018-12-01 DIAGNOSIS — O26899 Other specified pregnancy related conditions, unspecified trimester: Secondary | ICD-10-CM

## 2018-12-01 HISTORY — DX: Unspecified blood type, rh negative: Z67.91

## 2018-12-01 HISTORY — DX: Other specified pregnancy related conditions, unspecified trimester: O26.899

## 2018-12-01 LAB — URINE CULTURE, OB REFLEX

## 2018-12-01 LAB — CULTURE, OB URINE

## 2018-12-05 MED ORDER — TERCONAZOLE 0.8 % VA CREA: 1.0000 | TOPICAL_CREAM | Freq: Every day | VAGINAL | 0 refills | Status: DC

## 2018-12-05 NOTE — Addendum Note (Signed)
Addended by: Sharyon Cable on: 12/05/2018 09:25 AM   Modules accepted: Orders

## 2018-12-06 ENCOUNTER — Encounter: Payer: Self-pay | Admitting: Certified Nurse Midwife

## 2018-12-14 ENCOUNTER — Other Ambulatory Visit: Payer: Self-pay | Admitting: *Deleted

## 2018-12-14 DIAGNOSIS — Z3480 Encounter for supervision of other normal pregnancy, unspecified trimester: Secondary | ICD-10-CM

## 2018-12-14 MED ORDER — PRENATE MINI 29-0.6-0.4-350 MG PO CAPS: 1.0000 | ORAL_CAPSULE | Freq: Every day | ORAL | 3 refills | Status: DC

## 2018-12-14 NOTE — Progress Notes (Signed)
Pt had problem getting correct PNV at pharmacy. Prenate mini reordered today per pt request.

## 2019-01-02 ENCOUNTER — Telehealth: Payer: Self-pay | Admitting: *Deleted

## 2019-01-02 NOTE — Telephone Encounter (Signed)
Pt called to office to inquire about waterbirth.  Pt made aware there are steps that have to be completed before able to have waterbirth. Pt advised to discuss with provider at next appt.

## 2019-01-03 ENCOUNTER — Telehealth: Payer: Self-pay | Admitting: *Deleted

## 2019-01-03 NOTE — Telephone Encounter (Signed)
Pt called to office regarding fetal sex on genetic screen.   Pt made aware was not included as its noted she did not want to know sex.  Pt made aware form will be completed today and faxed to [employer] to try to add to results. Pt made aware could be out of timeframe to have done.  Pt advised may take a few days for changes to be made.

## 2019-01-05 ENCOUNTER — Encounter: Payer: Self-pay | Admitting: Certified Nurse Midwife

## 2019-01-24 ENCOUNTER — Ambulatory Visit (HOSPITAL_COMMUNITY)
Admission: RE | Admit: 2019-01-24 | Discharge: 2019-01-24 | Disposition: A | Discharge status: Home / Self Care | Payer: Medicaid Other | Source: Ambulatory Visit | Attending: Obstetrics and Gynecology | Admitting: Obstetrics and Gynecology

## 2019-01-24 ENCOUNTER — Other Ambulatory Visit (HOSPITAL_COMMUNITY): Payer: Self-pay | Admitting: *Deleted

## 2019-01-24 ENCOUNTER — Encounter: Payer: Self-pay | Admitting: Certified Nurse Midwife

## 2019-01-24 ENCOUNTER — Other Ambulatory Visit: Payer: Self-pay

## 2019-01-24 ENCOUNTER — Ambulatory Visit (INDEPENDENT_AMBULATORY_CARE_PROVIDER_SITE_OTHER): Payer: Medicaid Other | Admitting: Certified Nurse Midwife

## 2019-01-24 DIAGNOSIS — Z3A19 19 weeks gestation of pregnancy: Secondary | ICD-10-CM

## 2019-01-24 DIAGNOSIS — Z3480 Encounter for supervision of other normal pregnancy, unspecified trimester: Secondary | ICD-10-CM | POA: Diagnosis present

## 2019-01-24 DIAGNOSIS — Z363 Encounter for antenatal screening for malformations: Secondary | ICD-10-CM

## 2019-01-24 DIAGNOSIS — Z3482 Encounter for supervision of other normal pregnancy, second trimester: Secondary | ICD-10-CM

## 2019-01-24 DIAGNOSIS — Z362 Encounter for other antenatal screening follow-up: Secondary | ICD-10-CM

## 2019-01-24 MED ORDER — BLOOD PRESSURE MONITORING KIT: 1.0000 | PACK | 0 refills | Status: DC

## 2019-01-24 NOTE — Progress Notes (Signed)
TELEHEALTH OBSTETRICS PRENATAL VIRTUAL VIDEO VISIT ENCOUNTER NOTE  Provider location: [facility] [place] [facility] [place]   I connected with Jade Brown on 01/24/19 at  2:30 PM EDT by WebEx Video Encounter at home and verified that I am speaking with the correct person using two identifiers.   I discussed the limitations, risks, security and privacy concerns of performing an evaluation and management service by telephone and the availability of in person appointments. I also discussed with the patient that there may be a patient responsible charge related to this service. The patient expressed understanding and agreed to proceed. Subjective:  Jade Brown is a 28 y.o. G3P1011 at 70w4dbeing seen today for ongoing prenatal care.  She is currently monitored for the following issues for this low-risk pregnancy and has Pain in joint of right knee; Nausea; Rash and other nonspecific skin eruption; Abdominal pregnancy with intrauterine pregnancy-27w,5 day, EDD=1.27.15; Gastroenteritis, acute; Enteritis due to Norovirus; Nexplanon in place; Supervision of other normal pregnancy, antepartum; and Rh negative status during pregnancy on their problem list.  Patient reports no complaints.  Contractions: Not present. Vag. Bleeding: None.  Movement: Present. Denies any leaking of fluid.   The following portions of the patient's history were reviewed and updated as appropriate: allergies, current medications, past family history, past medical history, past social history, past surgical history and problem list.   Objective:  There were no vitals filed for this visit.  Fetal Status:     Movement: Present     General:  Alert, oriented and cooperative. Patient is in no acute distress.  Respiratory: Normal respiratory effort, no problems with respiration noted  Mental Status: Normal mood and affect. Normal behavior. Normal judgment and thought content.  Rest of physical exam deferred due to type of  encounter  Imaging: UKoreaMfm Ob Comp + 14 Wk  Result Date: 01/24/2019 ----------------------------------------------------------------------  OBSTETRICS REPORT                       (Signed Final 01/24/2019 12:50 pm) ---------------------------------------------------------------------- Patient Info  ID #:       [id number]                         D.O.B.:  1July 26, 1992(27 yrs)  Name:       Jade Brown                  Visit Date: 01/24/2019 10:46 am ---------------------------------------------------------------------- Performed By  Performed By:     KJeanene ErbBS,      Ref. Address:      Faculty                    RDMS  Attending:        RTama HighMD        [facility]  Referred By:      VKatheran James  ROGERS CNM ---------------------------------------------------------------------- Orders   #  Description                          Code         Ordered By   1  [place] MFM OB COMP + 14 WK               [id number]     VERONICA ROGERS  ----------------------------------------------------------------------   #  Order #                    Accession #                 Episode #   1  [id number]                  [id number]                  [id number]  ---------------------------------------------------------------------- Indications   [redacted] weeks gestation of pregnancy                Z3A.19   Encounter for antenatal screening for          Z36.3   malformations (Low Risk NIPS)  ---------------------------------------------------------------------- Vital Signs  Weight (lb): 180                               Height:        5'2"  BMI:         32.92 ---------------------------------------------------------------------- Fetal Evaluation  Num Of Fetuses:          1  Fetal Heart Rate(bpm):   145  Cardiac Activity:        Observed  Presentation:            Breech  Placenta:                Anterior  P. Cord Insertion:        Visualized  Amniotic Fluid  AFI FV:      Within normal limits                              Largest Pocket(cm)                              5.3 ---------------------------------------------------------------------- Biometry  BPD:      46.3  mm     G. Age:  20w 0d         68  %    CI:        72.54   %    70 - 86                                                          FL/HC:       16.5  %    16.8 - 19.8  HC:      172.9  mm     G. Age:  19w 6d         56  %    HC/AC:       1.16       1.09 -  1.39  AC:      149.5  mm     G. Age:  20w 1d         66  %    FL/BPD:      61.6  %  FL:       28.5  mm     G. Age:  18w 5d         17  %    FL/AC:       19.1  %    20 - 24  Est. FW:     300   gm   0 lb 11 oz      47  % ---------------------------------------------------------------------- OB History  Gravidity:    3         Term:   1        Prem:   0        SAB:   0  TOP:          1       Ectopic:  0        Living: 1 ---------------------------------------------------------------------- Gestational Age  LMP:           19w 4d        Date:  09/09/18                 EDD:   06/16/19  U/S Today:     19w 5d                                        EDD:   06/15/19  Best:          19w 4d     Det. By:  LMP  (09/09/18)          EDD:   06/16/19 ---------------------------------------------------------------------- Anatomy  Cranium:               Appears normal         Aortic Arch:            Not well visualized  Cavum:                 Appears normal         Ductal Arch:            Appears normal  Ventricles:            Appears normal         Diaphragm:              Appears normal  Choroid Plexus:        Appears normal         Stomach:                Appears normal, left                                                                        sided  Cerebellum:            Appears normal         Abdomen:  Appears normal  Posterior Fossa:       Appears normal         Abdominal Wall:         Appears nml (cord                                                                         insert, abd wall)  Nuchal Fold:           Not well visualized    Cord Vessels:           Appears normal (3                                                                        vessel cord)  Face:                  Appears normal         Kidneys:                Appear normal                         (orbits and profile)  Lips:                  Not well visualized    Bladder:                Appears normal  Thoracic:              Appears normal         Spine:                  Not well visualized  Heart:                 Appears normal         Upper Extremities:      Not well visualized                         (4CH, axis, and                         situs)  RVOT:                  Appears normal         Lower Extremities:      Appears normal  LVOT:                  Appears normal  Other:  Female gender Left Heel visualized. Technically difficult due to fetal          position. ---------------------------------------------------------------------- Cervix Uterus Adnexa  Cervix  Length:            3.1  cm.  Normal appearance by transabdominal scan. ---------------------------------------------------------------------- Impression  We performed fetal anatomy scan. No makers of  aneuploidies or fetal structural defects are seen. Fetal  biometry is consistent with her previously-established dates.  Amniotic fluid is normal and good fetal activity is seen.  Patient understands the limitations of ultrasound in detecting  fetal anomalies.  On cell-free fetal DNA screening, the risks of fetal  aneuploidies are not increased. ---------------------------------------------------------------------- Recommendations  An appointment was made for her to return in 4 weeks for  completion of fetal anatomy. ----------------------------------------------------------------------                  Tama High, MD Electronically Signed Final Report   01/24/2019 12:50 pm  ----------------------------------------------------------------------   Assessment and Plan:  Pregnancy: [id number] at 36w4d1. Supervision of other normal pregnancy, antepartum - Patient doing well, no complaints - Anticipatory guidance on upcoming appointments  - COVID 19 precautions  - Reviewed anatomy UKoreaperformed today, reviewed genetic screening  - Educated and discussed AFP with patient, patient requesting screening  - Patient denies receiving BP cuff in mail, resent BP Rx  - Blood Pressure Monitoring KIT; 1 kit by Does not apply route once a week.  Dispense: 1 kit; Refill: 0  Preterm labor symptoms and general obstetric precautions including but not limited to vaginal bleeding, contractions, leaking of fluid and fetal movement were reviewed in detail with the patient. I discussed the assessment and treatment plan with the patient. The patient was provided an opportunity to ask questions and all were answered. The patient agreed with the plan and demonstrated an understanding of the instructions. The patient was advised to call back or seek an in-person office evaluation/[facility]. Please refer to After Visit Summary for other counseling recommendations.   I provided 10 minutes of face-to-face time during this encounter.  Return in about 4 weeks (around 02/21/2019) for ROB/in person AFP.  Future Appointments  Date Time Provider [place] 02/21/2019  9:00 AM AWoodroe Mode MD [place]  03/01/2019  9:30 AM [place] W[place] [place] Company [place]

## 2019-01-24 NOTE — Progress Notes (Signed)
Pt has not received BP cuff.

## 2019-02-01 ENCOUNTER — Telehealth: Payer: Self-pay

## 2019-02-01 NOTE — Telephone Encounter (Signed)
TC from pt c/o "severe" abdominal and pelvic pain since last night.  Pain is so severe she is having difficulty eating. No relief with OTC pain meds Denies VB, L[facility] Appt desk informed me we do not have any appts today as pt requested Pt to report to [facility] for an evaluation.

## 2019-02-21 ENCOUNTER — Ambulatory Visit (INDEPENDENT_AMBULATORY_CARE_PROVIDER_SITE_OTHER): Payer: Medicaid Other | Admitting: Obstetrics & Gynecology

## 2019-02-21 ENCOUNTER — Other Ambulatory Visit: Payer: Self-pay

## 2019-02-21 VITALS — BP 105/68 | HR 77 | Temp 97.1°F | Wt 186.0 lb

## 2019-02-21 DIAGNOSIS — Z3482 Encounter for supervision of other normal pregnancy, second trimester: Secondary | ICD-10-CM

## 2019-02-21 DIAGNOSIS — Z3A23 23 weeks gestation of pregnancy: Secondary | ICD-10-CM

## 2019-02-21 DIAGNOSIS — Z3480 Encounter for supervision of other normal pregnancy, unspecified trimester: Secondary | ICD-10-CM

## 2019-02-21 MED ORDER — PANTOPRAZOLE SODIUM 20 MG PO TBEC: 20.0000 mg | DELAYED_RELEASE_TABLET | Freq: Every day | ORAL | 2 refills | Status: DC

## 2019-02-21 NOTE — Progress Notes (Signed)
CC: Nausea   Pt want to discuss if water births are being done.

## 2019-02-21 NOTE — Progress Notes (Signed)
   PRENATAL VISIT NOTE  Subjective:  Jade Brown is a 28 y.o. G3P1011 at [redacted]w[redacted]d being seen today for ongoing prenatal care.  She is currently monitored for the following issues for this low-risk pregnancy and has Pain in joint of right knee; Nausea; Rash and other nonspecific skin eruption; Abdominal pregnancy with intrauterine pregnancy-27w,5 day, EDD=1.27.15; Gastroenteritis, acute; Enteritis due to Norovirus; Nexplanon in place; Supervision of other normal pregnancy, antepartum; and Rh negative status during pregnancy on their problem list.  Patient reports heartburn, nausea and vomiting.  Contractions: Irritability. Vag. Bleeding: None.  Movement: Present. Denies leaking of fluid.   The following portions of the patient's history were reviewed and updated as appropriate: allergies, current medications, past family history, past medical history, past social history, past surgical history and problem list.   Objective:   Vitals:   02/21/19 0914  BP: 105/68  Pulse: 77  Temp: (!) 97.1 F (36.2 C)  Weight: 186 lb (84.4 kg)    Fetal Status: Fetal Heart Rate (bpm): 148   Movement: Present     General:  Alert, oriented and cooperative. Patient is in no acute distress.  Skin: Skin is warm and dry. No rash noted.   Cardiovascular: Normal heart rate noted  Respiratory: Normal respiratory effort, no problems with respiration noted  Abdomen: Soft, gravid, appropriate for gestational age.  Pain/Pressure: Present     Pelvic: Cervical exam deferred        Extremities: Normal range of motion.  Edema: Trace  Mental Status: Normal mood and affect. Normal behavior. Normal judgment and thought content.   Assessment and Plan:  Pregnancy: G3P1011 at [redacted]w[redacted]d 1. Supervision of other normal pregnancy, antepartum Screening, s/p nl [place] - AFP, Serum, Open Spina Bifida - pantoprazole (PROTONIX) 20 MG tablet; Take 1 tablet (20 mg total) by mouth daily.  Dispense: 30 tablet; Refill: 2  Preterm labor  symptoms and general obstetric precautions including but not limited to vaginal bleeding, contractions, leaking of fluid and fetal movement were reviewed in detail with the patient. Please refer to After Visit Summary for other counseling recommendations.   No follow-ups on file.  Future Appointments  Date Time Provider [place]  03/01/2019  9:30 AM WH-MFC [place] 1 WH-MFCUS MFC-US    Emeterio Reeve, MD

## 2019-02-21 NOTE — Patient Instructions (Signed)

## 2019-02-25 LAB — AFP, SERUM, OPEN SPINA BIFIDA
AFP MoM: 1.62
AFP Value: 130.2 ng/mL
Gest. Age on Collection Date: 23 weeks
Maternal Age At EDD: 27 yr
OSBR Risk 1 IN: 3989
Test Results:: NEGATIVE
Weight: 186 [lb_av]

## 2019-03-01 ENCOUNTER — Other Ambulatory Visit: Payer: Self-pay

## 2019-03-01 ENCOUNTER — Ambulatory Visit (HOSPITAL_COMMUNITY)
Admission: RE | Admit: 2019-03-01 | Discharge: 2019-03-01 | Disposition: A | Discharge status: Home / Self Care | Payer: Medicaid Other | Source: Ambulatory Visit | Attending: Obstetrics and Gynecology | Admitting: Obstetrics and Gynecology

## 2019-03-01 DIAGNOSIS — Z3A24 24 weeks gestation of pregnancy: Secondary | ICD-10-CM

## 2019-03-01 DIAGNOSIS — Z362 Encounter for other antenatal screening follow-up: Secondary | ICD-10-CM | POA: Diagnosis present

## 2019-03-21 ENCOUNTER — Encounter: Payer: Self-pay | Admitting: Obstetrics and Gynecology

## 2019-03-21 ENCOUNTER — Ambulatory Visit (INDEPENDENT_AMBULATORY_CARE_PROVIDER_SITE_OTHER): Payer: Medicaid Other | Admitting: Obstetrics and Gynecology

## 2019-03-21 ENCOUNTER — Other Ambulatory Visit: Payer: Medicaid Other

## 2019-03-21 ENCOUNTER — Other Ambulatory Visit: Payer: Self-pay

## 2019-03-21 VITALS — BP 109/70 | HR 83 | Wt 188.7 lb

## 2019-03-21 DIAGNOSIS — O360120 Maternal care for anti-D [Rh] antibodies, second trimester, not applicable or unspecified: Secondary | ICD-10-CM

## 2019-03-21 DIAGNOSIS — Z6791 Unspecified blood type, Rh negative: Secondary | ICD-10-CM

## 2019-03-21 DIAGNOSIS — Z3A27 27 weeks gestation of pregnancy: Secondary | ICD-10-CM | POA: Diagnosis not present

## 2019-03-21 DIAGNOSIS — Z3480 Encounter for supervision of other normal pregnancy, unspecified trimester: Secondary | ICD-10-CM

## 2019-03-21 DIAGNOSIS — Z23 Encounter for immunization: Secondary | ICD-10-CM | POA: Diagnosis not present

## 2019-03-21 DIAGNOSIS — O26893 Other specified pregnancy related conditions, third trimester: Secondary | ICD-10-CM

## 2019-03-21 MED ORDER — RHO D IMMUNE GLOBULIN 1500 UNIT/2ML IJ SOSY
300.0000 ug | PREFILLED_SYRINGE | Freq: Once | INTRAMUSCULAR | Status: AC
  Administered 2019-03-21: 300 ug via INTRAMUSCULAR

## 2019-03-21 NOTE — Progress Notes (Signed)
Subjective:  Jade Brown is a 28 y.o. G3P1011 at [redacted]w[redacted]d being seen today for ongoing prenatal care.  She is currently monitored for the following issues for this low-risk pregnancy and has Supervision of other normal pregnancy, antepartum and Rh negative status during pregnancy on their problem list.  Patient reports general discomforts of pregnancy.  Contractions: Irritability. Vag. Bleeding: None.  Movement: Present. Denies leaking of fluid.   The following portions of the patient's history were reviewed and updated as appropriate: allergies, current medications, past family history, past medical history, past social history, past surgical history and problem list. Problem list updated.  Objective:   Vitals:   03/21/19 0918  BP: 109/70  Pulse: 83  Weight: 188 lb 11.2 oz (85.6 kg)    Fetal Status:     Movement: Present     General:  Alert, oriented and cooperative. Patient is in no acute distress.  Skin: Skin is warm and dry. No rash noted.   Cardiovascular: Normal heart rate noted  Respiratory: Normal respiratory effort, no problems with respiration noted  Abdomen: Soft, gravid, appropriate for gestational age. Pain/Pressure: Present     Pelvic:  Cervical exam deferred        Extremities: Normal range of motion.  Edema: None  Mental Status: Normal mood and affect. Normal behavior. Normal judgment and thought content.   Urinalysis:      Assessment and Plan:  Pregnancy: G3P1011 at [redacted]w[redacted]d  1. Supervision of other normal pregnancy, antepartum Stable Tdap today - Glucose Tolerance, 2 Hours w/1 Hour - CBC - RPR - HIV Antibody (routine testing w rflx)  2. Rh negative status during pregnancy in third trimester Rhogam today  Preterm labor symptoms and general obstetric precautions including but not limited to vaginal bleeding, contractions, leaking of fluid and fetal movement were reviewed in detail with the patient. Please refer to After Visit Summary for other counseling  recommendations.  Return in about 2 weeks (around 04/04/2019) for OB visit, virtual.   Chancy Milroy, MD

## 2019-03-21 NOTE — Progress Notes (Signed)
Pt is here for ROB and 2 hr GTT. TDAP and Rhogam given today.

## 2019-03-21 NOTE — Patient Instructions (Signed)
Third Trimester of Pregnancy The third trimester is from week 28 through week 40 (months 7 through 9). The third trimester is a time when the unborn baby (fetus) is growing rapidly. At the end of the ninth month, the fetus is about 20 inches in length and weighs 6-10 pounds. Body changes during your third trimester Your body will continue to go through many changes during pregnancy. The changes vary from woman to woman. During the third trimester:  Your weight will continue to increase. You can expect to gain 25-35 pounds (11-16 kg) by the end of the pregnancy.  You may begin to get stretch marks on your hips, abdomen, and breasts.  You may urinate more often because the fetus is moving lower into your pelvis and pressing on your bladder.  You may develop or continue to have heartburn. This is caused by increased hormones that slow down muscles in the digestive tract.  You may develop or continue to have constipation because increased hormones slow digestion and cause the muscles that push waste through your intestines to relax.  You may develop hemorrhoids. These are swollen veins (varicose veins) in the rectum that can itch or be painful.  You may develop swollen, bulging veins (varicose veins) in your legs.  You may have increased body aches in the pelvis, back, or thighs. This is due to weight gain and increased hormones that are relaxing your joints.  You may have changes in your hair. These can include thickening of your hair, rapid growth, and changes in texture. Some women also have hair loss during or after pregnancy, or hair that feels dry or thin. Your hair will most likely return to normal after your baby is born.  Your breasts will continue to grow and they will continue to become tender. A yellow fluid (colostrum) may leak from your breasts. This is the first milk you are producing for your baby.  Your belly button may stick out.  You may notice more swelling in your hands,  face, or ankles.  You may have increased tingling or numbness in your hands, arms, and legs. The skin on your belly may also feel numb.  You may feel short of breath because of your expanding uterus.  You may have more problems sleeping. This can be caused by the size of your belly, increased need to urinate, and an increase in your body's metabolism.  You may notice the fetus "dropping," or moving lower in your abdomen (lightening).  You may have increased vaginal discharge.  You may notice your joints feel loose and you may have pain around your pelvic bone. What to expect at prenatal visits You will have prenatal exams every 2 weeks until week 36. Then you will have weekly prenatal exams. During a routine prenatal visit:  You will be weighed to make sure you and the baby are growing normally.  Your blood pressure will be taken.  Your abdomen will be measured to track your baby's growth.  The fetal heartbeat will be listened to.  Any test results from the previous visit will be discussed.  You may have a cervical check near your due date to see if your cervix has softened or thinned (effaced).  You will be tested for Group B streptococcus. This happens between 35 and 37 weeks. Your health care provider may ask you:  What your birth plan is.  How you are feeling.  If you are feeling the baby move.  If you have had any abnormal   symptoms, such as leaking fluid, bleeding, severe headaches, or abdominal cramping.  If you are using any tobacco products, including cigarettes, chewing tobacco, and electronic cigarettes.  If you have any questions. Other tests or screenings that may be performed during your third trimester include:  Blood tests that check for low iron levels (anemia).  Fetal testing to check the health, activity level, and growth of the fetus. Testing is done if you have certain medical conditions or if there are problems during the pregnancy.  Nonstress test  (NST). This test checks the health of your baby to make sure there are no signs of problems, such as the baby not getting enough oxygen. During this test, a belt is placed around your belly. The baby is made to move, and its heart rate is monitored during movement. What is false labor? False labor is a condition in which you feel small, irregular tightenings of the muscles in the womb (contractions) that usually go away with rest, changing position, or drinking water. These are called Braxton Hicks contractions. Contractions may last for hours, days, or even weeks before true labor sets in. If contractions come at regular intervals, become more frequent, increase in intensity, or become painful, you should see your health care provider. What are the signs of labor?  Abdominal cramps.  Regular contractions that start at 10 minutes apart and become stronger and more frequent with time.  Contractions that start on the top of the uterus and spread down to the lower abdomen and back.  Increased pelvic pressure and dull back pain.  A watery or bloody mucus discharge that comes from the vagina.  Leaking of amniotic fluid. This is also known as your "water breaking." It could be a slow trickle or a gush. Let your health care provider know if it has a color or strange odor. If you have any of these signs, call your health care provider right away, even if it is before your due date. Follow these instructions at home: Medicines  Follow your health care provider's instructions regarding medicine use. Specific medicines may be either safe or unsafe to take during pregnancy.  Take a prenatal vitamin that contains at least 600 micrograms (mcg) of folic acid.  If you develop constipation, try taking a stool softener if your health care provider approves. Eating and drinking   Eat a balanced diet that includes fresh fruits and vegetables, whole grains, good sources of protein such as meat, eggs, or tofu,  and low-fat dairy. Your health care provider will help you determine the amount of weight gain that is right for you.  Avoid raw meat and uncooked cheese. These carry germs that can cause birth defects in the baby.  If you have low calcium intake from food, talk to your health care provider about whether you should take a daily calcium supplement.  Eat four or five small meals rather than three large meals a day.  Limit foods that are high in fat and processed sugars, such as fried and sweet foods.  To prevent constipation: ? Drink enough fluid to keep your urine clear or pale yellow. ? Eat foods that are high in fiber, such as fresh fruits and vegetables, whole grains, and beans. Activity  Exercise only as directed by your health care provider. Most women can continue their usual exercise routine during pregnancy. Try to exercise for 30 minutes at least 5 days a week. Stop exercising if you experience uterine contractions.  Avoid heavy lifting.  Do   not exercise in extreme heat or humidity, or at high altitudes.  Wear low-heel, comfortable shoes.  Practice good posture.  You may continue to have sex unless your health care provider tells you otherwise. Relieving pain and discomfort  Take frequent breaks and rest with your legs elevated if you have leg cramps or low back pain.  Take warm sitz baths to soothe any pain or discomfort caused by hemorrhoids. Use hemorrhoid cream if your health care provider approves.  Wear a good support bra to prevent discomfort from breast tenderness.  If you develop varicose veins: ? Wear support pantyhose or compression stockings as told by your healthcare provider. ? Elevate your feet for 15 minutes, 3-4 times a day. Prenatal care  Write down your questions. Take them to your prenatal visits.  Keep all your prenatal visits as told by your health care provider. This is important. Safety  Wear your seat belt at all times when driving.  Make  a list of emergency phone numbers, including numbers for family, friends, the hospital, and police and fire departments. General instructions  Avoid cat litter boxes and soil used by cats. These carry germs that can cause birth defects in the baby. If you have a cat, ask someone to clean the litter box for you.  Do not travel far distances unless it is absolutely necessary and only with the approval of your health care provider.  Do not use hot tubs, steam rooms, or saunas.  Do not drink alcohol.  Do not use any products that contain nicotine or tobacco, such as cigarettes and e-cigarettes. If you need help quitting, ask your health care provider.  Do not use any medicinal herbs or unprescribed drugs. These chemicals affect the formation and growth of the baby.  Do not douche or use tampons or scented sanitary pads.  Do not cross your legs for long periods of time.  To prepare for the arrival of your baby: ? Take prenatal classes to understand, practice, and ask questions about labor and delivery. ? Make a trial run to the hospital. ? Visit the hospital and tour the maternity area. ? Arrange for maternity or paternity leave through employers. ? Arrange for family and friends to take care of pets while you are in the hospital. ? Purchase a rear-facing car seat and make sure you know how to install it in your car. ? Pack your hospital bag. ? Prepare the baby's nursery. Make sure to remove all pillows and stuffed animals from the baby's crib to prevent suffocation.  Visit your dentist if you have not gone during your pregnancy. Use a soft toothbrush to brush your teeth and be gentle when you floss. Contact a health care provider if:  You are unsure if you are in labor or if your water has broken.  You become dizzy.  You have mild pelvic cramps, pelvic pressure, or nagging pain in your abdominal area.  You have lower back pain.  You have persistent nausea, vomiting, or diarrhea.   You have an unusual or bad smelling vaginal discharge.  You have pain when you urinate. Get help right away if:  Your water breaks before 37 weeks.  You have regular contractions less than 5 minutes apart before 37 weeks.  You have a fever.  You are leaking fluid from your vagina.  You have spotting or bleeding from your vagina.  You have severe abdominal pain or cramping.  You have rapid weight loss or weight gain.  You have   shortness of breath with chest pain.  You notice sudden or extreme swelling of your face, hands, ankles, feet, or legs.  Your baby makes fewer than 10 movements in 2 hours.  You have severe headaches that do not go away when you take medicine.  You have vision changes. Summary  The third trimester is from week 28 through week 40, months 7 through 9. The third trimester is a time when the unborn baby (fetus) is growing rapidly.  During the third trimester, your discomfort may increase as you and your baby continue to gain weight. You may have abdominal, leg, and back pain, sleeping problems, and an increased need to urinate.  During the third trimester your breasts will keep growing and they will continue to become tender. A yellow fluid (colostrum) may leak from your breasts. This is the first milk you are producing for your baby.  False labor is a condition in which you feel small, irregular tightenings of the muscles in the womb (contractions) that eventually go away. These are called Braxton Hicks contractions. Contractions may last for hours, days, or even weeks before true labor sets in.  Signs of labor can include: abdominal cramps; regular contractions that start at 10 minutes apart and become stronger and more frequent with time; watery or bloody mucus discharge that comes from the vagina; increased pelvic pressure and dull back pain; and leaking of amniotic fluid. This information is not intended to replace advice given to you by your health  care provider. Make sure you discuss any questions you have with your health care provider. Document Released: 07/20/2001 Document Revised: 11/16/2018 Document Reviewed: 08/31/2016 [employer] Patient Education  2020 [employer]  

## 2019-03-22 LAB — HIV ANTIBODY (ROUTINE TESTING W REFLEX): HIV Screen 4th Generation wRfx: NONREACTIVE

## 2019-03-22 LAB — CBC
Hematocrit: 32 % — ABNORMAL LOW (ref 34.0–46.6)
Hemoglobin: 10.4 g/dL — ABNORMAL LOW (ref 11.1–15.9)
MCH: 30.1 pg (ref 26.6–33.0)
MCHC: 32.5 g/dL (ref 31.5–35.7)
MCV: 93 fL (ref 79–97)
Platelets: 289 10*3/uL (ref 150–450)
RBC: 3.45 x10E6/uL — ABNORMAL LOW (ref 3.77–5.28)
RDW: 12.5 % (ref 11.7–15.4)
WBC: 14.4 10*3/uL — ABNORMAL HIGH (ref 3.4–10.8)

## 2019-03-22 LAB — RPR: RPR Ser Ql: NONREACTIVE

## 2019-03-22 LAB — GLUCOSE TOLERANCE, 2 HOURS W/ 1HR
Glucose, 1 hour: 113 mg/dL (ref 65–179)
Glucose, 2 hour: 101 mg/dL (ref 65–152)
Glucose, Fasting: 85 mg/dL (ref 65–91)

## 2019-04-03 ENCOUNTER — Telehealth: Payer: Medicaid Other | Admitting: Obstetrics

## 2019-04-04 ENCOUNTER — Telehealth: Payer: Medicaid Other | Admitting: Obstetrics

## 2019-04-05 ENCOUNTER — Encounter: Payer: Self-pay | Admitting: Obstetrics

## 2019-04-05 ENCOUNTER — Telehealth: Payer: Self-pay

## 2019-04-05 ENCOUNTER — Telehealth: Payer: Medicaid Other | Admitting: Obstetrics

## 2019-04-05 DIAGNOSIS — Z6791 Unspecified blood type, Rh negative: Secondary | ICD-10-CM

## 2019-04-05 DIAGNOSIS — O26893 Other specified pregnancy related conditions, third trimester: Secondary | ICD-10-CM

## 2019-04-05 DIAGNOSIS — Z3480 Encounter for supervision of other normal pregnancy, unspecified trimester: Secondary | ICD-10-CM

## 2019-04-05 NOTE — Progress Notes (Signed)
Patient did not answer phone for virtual encounter.

## 2019-04-05 NOTE — Telephone Encounter (Addendum)
Completed OB intake questions prior to pt's phone disconnecting. Attemped to call back Unable to reach pt for virtal ROB visit.

## 2019-04-05 NOTE — Progress Notes (Signed)
I connected with Jade Brown on 04/05/19 at 10:15 AM EDT by telephone and verified that I am speaking with the correct person using two identifiers.  My Chart ROB - pt having phone difficulties  No concerns today per pt

## 2019-04-12 ENCOUNTER — Inpatient Hospital Stay (HOSPITAL_COMMUNITY)
Admission: AD | Admit: 2019-04-12 | Discharge: 2019-04-12 | Disposition: A | Discharge status: Home / Self Care | Payer: Medicaid Other | Attending: Family Medicine | Admitting: Family Medicine

## 2019-04-12 ENCOUNTER — Telehealth: Payer: Self-pay

## 2019-04-12 ENCOUNTER — Other Ambulatory Visit: Payer: Self-pay

## 2019-04-12 ENCOUNTER — Encounter (HOSPITAL_COMMUNITY): Payer: Self-pay | Admitting: *Deleted

## 2019-04-12 DIAGNOSIS — R109 Unspecified abdominal pain: Secondary | ICD-10-CM

## 2019-04-12 DIAGNOSIS — Z6791 Unspecified blood type, Rh negative: Secondary | ICD-10-CM

## 2019-04-12 DIAGNOSIS — R102 Pelvic and perineal pain: Secondary | ICD-10-CM | POA: Diagnosis present

## 2019-04-12 DIAGNOSIS — Z833 Family history of diabetes mellitus: Secondary | ICD-10-CM | POA: Diagnosis not present

## 2019-04-12 DIAGNOSIS — Z881 Allergy status to other antibiotic agents status: Secondary | ICD-10-CM | POA: Diagnosis not present

## 2019-04-12 DIAGNOSIS — Z87891 Personal history of nicotine dependence: Secondary | ICD-10-CM | POA: Insufficient documentation

## 2019-04-12 DIAGNOSIS — O26893 Other specified pregnancy related conditions, third trimester: Secondary | ICD-10-CM | POA: Diagnosis not present

## 2019-04-12 DIAGNOSIS — Z3A30 30 weeks gestation of pregnancy: Secondary | ICD-10-CM | POA: Insufficient documentation

## 2019-04-12 DIAGNOSIS — Z9049 Acquired absence of other specified parts of digestive tract: Secondary | ICD-10-CM | POA: Diagnosis not present

## 2019-04-12 DIAGNOSIS — O212 Late vomiting of pregnancy: Secondary | ICD-10-CM | POA: Diagnosis not present

## 2019-04-12 DIAGNOSIS — O219 Vomiting of pregnancy, unspecified: Secondary | ICD-10-CM | POA: Diagnosis not present

## 2019-04-12 DIAGNOSIS — Z8249 Family history of ischemic heart disease and other diseases of the circulatory system: Secondary | ICD-10-CM | POA: Insufficient documentation

## 2019-04-12 LAB — URINALYSIS, ROUTINE W REFLEX MICROSCOPIC
Bilirubin Urine: NEGATIVE
Glucose, UA: NEGATIVE mg/dL
Hgb urine dipstick: NEGATIVE
Ketones, ur: NEGATIVE mg/dL
Leukocytes,Ua: NEGATIVE
Nitrite: NEGATIVE
Protein, ur: NEGATIVE mg/dL
Specific Gravity, Urine: 1.019 (ref 1.005–1.030)
pH: 7 (ref 5.0–8.0)

## 2019-04-12 MED ORDER — PROMETHAZINE HCL 25 MG PO TABS: 25.0000 mg | ORAL_TABLET | Freq: Every evening | ORAL | 2 refills | Status: DC | PRN

## 2019-04-12 MED ORDER — COMFORT FIT MATERNITY SUPP MED MISC: 1.0000 | Freq: Every day | 0 refills | Status: DC

## 2019-04-12 MED ORDER — METOCLOPRAMIDE HCL 10 MG PO TABS: 10.0000 mg | ORAL_TABLET | Freq: Three times a day (TID) | ORAL | 0 refills | Status: DC

## 2019-04-12 NOTE — MAU Provider Note (Signed)
Chief Complaint:  Contractions   First Provider Initiated Contact with Patient 04/12/19 1148      HPI: Jade Brown is a 28 y.o. G3P1011 at 54w5dby LMP who presents to maternity admissions reporting pelvic pain and intermittent cramping last night, still present but improving this morning. She reports she often has cramping at night that resolves during the day but last night the pain was more and the cramping was more frequent, 5-6 times per hour. Pain is low in her abdomen, intermittent, and radiates to her low back. It was associated with feeling cold and sweaty last night, which has resolved today. She reports she is drinking plenty of water. She has frequent nausea and vomiting but denies nausea currently.  There are no other symptoms. She has not tried other treatments.  She reports good fetal movement.  HPI  Past Medical History: Past Medical History:  Diagnosis Date  . Gallstones     Past obstetric history: OB History  Gravida Para Term Preterm AB Living  3 1 1   1 1   SAB TAB Ectopic Multiple Live Births    1     1    # Outcome Date GA Lbr Len/2nd Weight Sex Delivery Anes PTL Lv  3 Current           2 Term 09/03/13 [id number] / 01:05 3225 g M Vag-Spont EPI  LIV  1 TAB 05/09/10 8w19w0d        Past Surgical History: Past Surgical History:  Procedure Laterality Date  . CHOLECYSTECTOMY N/A 12/04/2013   Procedure: LAPAROSCOPIC CHOLECYSTECTOMY;  Surgeon: ArmRalene OkD;  [facility];  Laterality: N/A;  . DILATION AND CURETTAGE OF UTERUS    . WISDOM TOOTH EXTRACTION      Family History: Family History  Problem Relation Age of Onset  . Hypertension Mother   . Diabetes Mother   . Diabetes Maternal Grandmother     Social History: Social History   Tobacco Use  . Smoking status: Former Smoker    Years: 2.00    Quit date: 01/12/2011    Years since quitting: 8.2  . Smokeless tobacco: Never Used  Substance Use Topics  . Alcohol use: No  .  Drug use: No    Allergies:  Allergies  Allergen Reactions  . Flagyl [Metronidazole] Nausea And Vomiting    Meds:  No medications prior to admission.    ROS:  Review of Systems  Constitutional: Positive for diaphoresis. Negative for chills, fatigue and fever.  Eyes: Negative for visual disturbance.  Respiratory: Negative for shortness of breath.   Cardiovascular: Negative for chest pain.  Gastrointestinal: Positive for abdominal pain. Negative for constipation, nausea and vomiting.  Genitourinary: Negative for difficulty urinating, dysuria, flank pain, pelvic pain, vaginal bleeding, vaginal discharge and vaginal pain.  Musculoskeletal: Positive for back pain.  Neurological: Negative for dizziness and headaches.  Psychiatric/Behavioral: Negative.      I have reviewed patient's Past Medical Hx, Surgical Hx, Family Hx, Social Hx, medications and allergies.   Physical Exam   Patient Vitals for the past 24 hrs:  BP Temp Pulse Resp Height Weight  04/12/19 1337 112/67 - 95 18 - -  04/12/19 1134 (!) 120/56 98 F (36.7 C) 91 18 5' 3"  (1.6 m) 83.5 kg   Constitutional: Well-developed, well-nourished female in no acute distress.  Cardiovascular: normal rate Respiratory: normal effort GI: Abd soft, non-tender, gravid appropriate for gestational age.  MS: Extremities nontender,  no edema, normal ROM Neurologic: Alert and oriented x 4.  GU: Neg CVAT.  PELVIC EXAM:   Effacement (%): 0 Cervical Position: Posterior Exam by:: Leftwich-Kirby, CNM  FHT:  Baseline 135 , moderate variability, accelerations present, no decelerations Contractions: None on toco or to palpation   Labs: Results for orders placed or performed during the hospital encounter of 04/12/19 (from the past 24 hour(s))  Urinalysis, Routine w reflex microscopic     Status: Abnormal   Collection Time: 04/12/19 11:27 AM  Result Value Ref Range   Color, Urine YELLOW YELLOW   APPearance HAZY (A) CLEAR   Specific  Gravity, Urine 1.019 1.005 - 1.030   pH 7.0 5.0 - 8.0   Glucose, UA NEGATIVE NEGATIVE mg/dL   Hgb urine dipstick NEGATIVE NEGATIVE   Bilirubin Urine NEGATIVE NEGATIVE   Ketones, ur NEGATIVE NEGATIVE mg/dL   Protein, ur NEGATIVE NEGATIVE mg/dL   Nitrite NEGATIVE NEGATIVE   Leukocytes,Ua NEGATIVE NEGATIVE   A/Negative/-- (04/22 4035)  Imaging:  No results found.  MAU Course/MDM: Orders Placed This Encounter  Procedures  . Urinalysis, Routine w reflex microscopic  . Discharge patient    Meds ordered this encounter  Medications  . promethazine (PHENERGAN) 25 MG tablet    Sig: Take 1 tablet (25 mg total) by mouth at bedtime as needed for nausea or vomiting.    Dispense:  30 tablet    Refill:  2    Order Specific Question:   Supervising Provider    Answer:   Donnamae Jude [[id number]]  . metoCLOPramide (REGLAN) 10 MG tablet    Sig: Take 1 tablet (10 mg total) by mouth 3 (three) times daily before meals.    Dispense:  30 tablet    Refill:  0    Order Specific Question:   Supervising Provider    Answer:   Donnamae Jude [[id number]]  . Elastic Bandages & Supports (COMFORT FIT MATERNITY SUPP MED) MISC    Sig: 1 Device by Does not apply route daily.    Dispense:  1 each    Refill:  0    Order Specific Question:   Supervising Provider    Answer:   Donnamae Jude [[id number]]     NST reviewed and reactive Toco readjusted, no contractions on toco or to palpation and cervix closed. No evidence of preterm labor. Ua wnl. No acute abdomen. Pelvic pain of pregnancy, possible round ligament pain or other musculoskeletal pain as it occurs at night when resting Rest/ice/heat/warm bath/Tylenol for pain.  Pregnancy support belt prescription written.  Pt to f/u in office in next 2 weeks. Return to MAU with signs of preterm labor or emergencies.   Assessment: 1. Abdominal pain during pregnancy, third trimester   2. Rh negative status during pregnancy in third trimester   3. Pelvic pain affecting pregnancy  in third trimester, antepartum   4. Nausea and vomiting during pregnancy     Plan: Discharge home Labor precautions and fetal kick counts Follow-up Information    [facility] Follow up in 2 week(s).   Why: Call the office to set up your next OB appt. Return to MAU as needed for emergencies.  Contact information: [place] [address] [phone]         Allergies as of 04/12/2019      Reactions   Flagyl [metronidazole] Nausea And Vomiting      Medication List    TAKE these medications  Blood Pressure Monitoring Kit 1 kit by Does not apply route once a week.   Comfort Fit Maternity Supp Med Misc 1 Device by Does not apply route daily.   metoCLOPramide 10 MG tablet Commonly known as: REGLAN Take 1 tablet (10 mg total) by mouth 3 (three) times daily before meals.   multivitamin-prenatal 27-0.8 MG Tabs tablet Take 1 tablet by mouth daily at 12 noon.   ondansetron 4 MG disintegrating tablet Commonly known as: Zofran ODT Take 1 tablet (4 mg total) by mouth every 8 (eight) hours as needed for nausea or vomiting.   pantoprazole 20 MG tablet Commonly known as: Protonix Take 1 tablet (20 mg total) by mouth daily.   Prenate Mini 29-0.6-0.4-350 MG Caps Take 1 tablet by mouth daily.   promethazine 25 MG tablet Commonly known as: PHENERGAN Take 1 tablet (25 mg total) by mouth at bedtime as needed for nausea or vomiting.       Fatima Blank Certified Nurse-Midwife 04/12/2019 1:47 PM

## 2019-04-12 NOTE — Discharge Instructions (Signed)

## 2019-04-12 NOTE — Telephone Encounter (Signed)
Pt called stating that she is in a lot of pelvic pain. She states that she was not able to sleep well, and woke up with cold sweats with her night clothes wet. She states that she is having some contraction that are consistent. Pt advised to go to MAU for evaluation.

## 2019-04-12 NOTE — MAU Note (Signed)
Pt reports she has sharp pain and cramping in her lower abd and sides radiating to her back. Also c/o pain in lower legs and calf. Stated she woke up last night feeling cold and sweaty.

## 2019-05-15 ENCOUNTER — Ambulatory Visit (INDEPENDENT_AMBULATORY_CARE_PROVIDER_SITE_OTHER): Payer: Medicaid Other

## 2019-05-15 ENCOUNTER — Other Ambulatory Visit: Payer: Self-pay

## 2019-05-15 ENCOUNTER — Other Ambulatory Visit (HOSPITAL_COMMUNITY)
Admission: RE | Admit: 2019-05-15 | Discharge: 2019-05-15 | Disposition: A | Discharge status: Home / Self Care | Payer: Medicaid Other | Source: Ambulatory Visit

## 2019-05-15 VITALS — BP 112/72 | HR 83 | Wt 191.6 lb

## 2019-05-15 DIAGNOSIS — O219 Vomiting of pregnancy, unspecified: Secondary | ICD-10-CM

## 2019-05-15 DIAGNOSIS — Z3480 Encounter for supervision of other normal pregnancy, unspecified trimester: Secondary | ICD-10-CM

## 2019-05-15 DIAGNOSIS — Z3A35 35 weeks gestation of pregnancy: Secondary | ICD-10-CM

## 2019-05-15 NOTE — Progress Notes (Signed)
    Subjective:  Jade Brown is a 28 y.o. G3P1011 at [redacted]w[redacted]d being seen today for ongoing prenatal care.  She is currently monitored for the following issues for this low-risk pregnancy and has Supervision of other normal pregnancy, antepartum and Rh negative status during pregnancy on their problem list.  Patient reports ongoing nausea and vomiting. Patient states that N/V has gotten worse in the last month and prompts her to vomit every morning. Patient is able to tolerate soft bland foods such as soup, apple sauce, and yogurt occasionally. Fluids are tolerated as well with occasional vomiting. Patient denies weight loss. Of note, patient is no longer taking any medication for N/V. Patient states that "it is not working" and makes her tired. She has tried ginger chews as well, with no relief.   Patient also endorses irregular contractions and abdominal pain that occur most often at night and are alleviated with movement.  Patient endorses fetal movement and trace lower extremity edema. Denies bleeding, LOF, vaginal discharge, odor, or itching.   The following portions of the patient's history were reviewed and updated as appropriate: allergies, current medications, past family history, past medical history, past social history, past surgical history and problem list.   Objective:   Vitals:   05/15/19 1525  BP: 112/72  Pulse: 83  Weight: 86.9 kg    Fetal Status: Fetal Heart Rate (bpm): 160   Movement: Present     General:  Alert, oriented and cooperative. Patient is in no acute distress.  Skin: Skin is warm and dry. No rash noted.   Cardiovascular: Normal heart rate noted  Respiratory: Normal respiratory effort, no problems with respiration noted  Abdomen: Soft, gravid, appropriate for gestational age. Pain/Pressure: Present     Pelvic:  Cervical exam deferred        Extremities: Normal range of motion.  Edema: Trace  Mental Status: Normal mood and affect. Normal behavior. Normal  judgment and thought content.    Assessment and Plan:  Pregnancy: G3P1011 at [redacted]w[redacted]d  1. Supervision of other normal pregnancy, antepartum - Will follow up with patient regarding GBS and GC/Chlamydia swab results. - Encouraged patient try warm showers and baths to help alleviate pain associated with contractions.  - Encouraged patient to continue eating bland foods to help with N/V.   Term and preterm labor symptoms and general obstetric precautions including but not limited to vaginal bleeding, contractions, leaking of fluid and fetal movement were reviewed in detail with the patient. Please refer to After Visit Summary for other counseling recommendations.  Return in about 2 weeks (around 05/29/2019) for LR-ROB.   Margarette Asal, Student-PA

## 2019-05-15 NOTE — Patient Instructions (Signed)
Contraception Choices Contraception, also called birth control, refers to methods or devices that prevent pregnancy. Hormonal methods Contraceptive implant  A contraceptive implant is a thin, plastic tube that contains a hormone. It is inserted into the upper part of the arm. It can remain in place for up to 3 years. Progestin-only injections Progestin-only injections are injections of progestin, a synthetic form of the hormone progesterone. They are given every 3 months by a health care provider. Birth control pills  Birth control pills are pills that contain hormones that prevent pregnancy. They must be taken once a day, preferably at the same time each day. Birth control patch  The birth control patch contains hormones that prevent pregnancy. It is placed on the skin and must be changed once a week for three weeks and removed on the fourth week. A prescription is needed to use this method of contraception. Vaginal ring  A vaginal ring contains hormones that prevent pregnancy. It is placed in the vagina for three weeks and removed on the fourth week. After that, the process is repeated with a new ring. A prescription is needed to use this method of contraception. Emergency contraceptive Emergency contraceptives prevent pregnancy after unprotected sex. They come in pill form and can be taken up to 5 days after sex. They work best the sooner they are taken after having sex. Most emergency contraceptives are available without a prescription. This method should not be used as your only form of birth control. Barrier methods Female condom  A female condom is a thin sheath that is worn over the penis during sex. Condoms keep sperm from going inside a woman's body. They can be used with a spermicide to increase their effectiveness. They should be disposed after a single use. Female condom  A female condom is a soft, loose-fitting sheath that is put into the vagina before sex. The condom keeps sperm  from going inside a woman's body. They should be disposed after a single use. Diaphragm  A diaphragm is a soft, dome-shaped barrier. It is inserted into the vagina before sex, along with a spermicide. The diaphragm blocks sperm from entering the uterus, and the spermicide kills sperm. A diaphragm should be left in the vagina for 6-8 hours after sex and removed within 24 hours. A diaphragm is prescribed and fitted by a health care provider. A diaphragm should be replaced every 1-2 years, after giving birth, after gaining more than 15 lb (6.8 kg), and after pelvic surgery. Cervical cap  A cervical cap is a round, soft latex or plastic cup that fits over the cervix. It is inserted into the vagina before sex, along with spermicide. It blocks sperm from entering the uterus. The cap should be left in place for 6-8 hours after sex and removed within 48 hours. A cervical cap must be prescribed and fitted by a health care provider. It should be replaced every 2 years. Sponge  A sponge is a soft, circular piece of polyurethane foam with spermicide on it. The sponge helps block sperm from entering the uterus, and the spermicide kills sperm. To use it, you make it wet and then insert it into the vagina. It should be inserted before sex, left in for at least 6 hours after sex, and removed and thrown away within 30 hours. Spermicides Spermicides are chemicals that kill or block sperm from entering the cervix and uterus. They can come as a cream, jelly, suppository, foam, or tablet. A spermicide should be inserted into the   vagina with an applicator at least 10-15 minutes before sex to allow time for it to work. The process must be repeated every time you have sex. Spermicides do not require a prescription. Intrauterine contraception Intrauterine device (IUD) An IUD is a T-shaped device that is put in a woman's uterus. There are two types:  Hormone IUD.This type contains progestin, a synthetic form of the hormone  progesterone. This type can stay in place for 3-5 years.  Copper IUD.This type is wrapped in copper wire. It can stay in place for 10 years.  Permanent methods of contraception Female tubal ligation In this method, a woman's fallopian tubes are sealed, tied, or blocked during surgery to prevent eggs from traveling to the uterus. Hysteroscopic sterilization In this method, a small, flexible insert is placed into each fallopian tube. The inserts cause scar tissue to form in the fallopian tubes and block them, so sperm cannot reach an egg. The procedure takes about 3 months to be effective. Another form of birth control must be used during those 3 months. Female sterilization This is a procedure to tie off the tubes that carry sperm (vasectomy). After the procedure, the man can still ejaculate fluid (semen). Natural planning methods Natural family planning In this method, a couple does not have sex on days when the woman could become pregnant. Calendar method This means keeping track of the length of each menstrual cycle, identifying the days when pregnancy can happen, and not having sex on those days. Ovulation method In this method, a couple avoids sex during ovulation. Symptothermal method This method involves not having sex during ovulation. The woman typically checks for ovulation by watching changes in her temperature and in the consistency of cervical mucus. Post-ovulation method In this method, a couple waits to have sex until after ovulation. Summary  Contraception, also called birth control, means methods or devices that prevent pregnancy.  Hormonal methods of contraception include implants, injections, pills, patches, vaginal rings, and emergency contraceptives.  Barrier methods of contraception can include female condoms, female condoms, diaphragms, cervical caps, sponges, and spermicides.  There are two types of IUDs (intrauterine devices). An IUD can be put in a woman's uterus to  prevent pregnancy for 3-5 years.  Permanent sterilization can be done through a procedure for males, females, or both.  Natural family planning methods involve not having sex on days when the woman could become pregnant. This information is not intended to replace advice given to you by your health care provider. Make sure you discuss any questions you have with your health care provider. Document Released: 07/26/2005 Document Revised: 07/28/2017 Document Reviewed: 08/28/2016 Elsevier Patient Education  2020 [employer]  

## 2019-05-15 NOTE — Progress Notes (Signed)
Patient reports fetal movement with irregular contractions. Pt reports still having issues with N&V, states that nausea medication does not work. Pt wants cervix checked today.

## 2019-05-17 ENCOUNTER — Encounter (HOSPITAL_COMMUNITY): Payer: Self-pay

## 2019-05-17 DIAGNOSIS — B951 Streptococcus, group B, as the cause of diseases classified elsewhere: Secondary | ICD-10-CM | POA: Insufficient documentation

## 2019-05-17 LAB — CERVICOVAGINAL ANCILLARY ONLY
Chlamydia: NEGATIVE
Comment: NEGATIVE
Comment: NORMAL
Neisseria Gonorrhea: NEGATIVE

## 2019-05-17 LAB — STREP GP B NAA: Strep Gp B NAA: POSITIVE — AB

## 2019-05-21 ENCOUNTER — Other Ambulatory Visit: Payer: Self-pay

## 2019-05-21 MED ORDER — METOCLOPRAMIDE HCL 10 MG PO TABS: 10.0000 mg | ORAL_TABLET | Freq: Three times a day (TID) | ORAL | 0 refills | Status: DC

## 2019-05-21 NOTE — Telephone Encounter (Signed)
Patient request refill. Kathrene Alu RN

## 2019-05-22 ENCOUNTER — Telehealth: Payer: Medicaid Other | Admitting: Obstetrics

## 2019-05-22 DIAGNOSIS — Z6791 Unspecified blood type, Rh negative: Secondary | ICD-10-CM

## 2019-05-22 DIAGNOSIS — B951 Streptococcus, group B, as the cause of diseases classified elsewhere: Secondary | ICD-10-CM

## 2019-05-22 DIAGNOSIS — Z3480 Encounter for supervision of other normal pregnancy, unspecified trimester: Secondary | ICD-10-CM

## 2019-05-22 NOTE — Progress Notes (Signed)
S/w pt for mychart visit. Pt reports fetal movement with pressure. Pt states she does not have BP cuff with her.

## 2019-05-23 NOTE — Progress Notes (Addendum)
Nurse not able to connect with patient by telephone.  Shelly Bombard MD 05/22/2019

## 2019-06-06 ENCOUNTER — Other Ambulatory Visit: Payer: Self-pay

## 2019-06-06 ENCOUNTER — Ambulatory Visit (INDEPENDENT_AMBULATORY_CARE_PROVIDER_SITE_OTHER): Payer: Medicaid Other | Admitting: Obstetrics

## 2019-06-06 ENCOUNTER — Encounter: Payer: Self-pay | Admitting: Obstetrics

## 2019-06-06 DIAGNOSIS — Z3483 Encounter for supervision of other normal pregnancy, third trimester: Secondary | ICD-10-CM

## 2019-06-06 DIAGNOSIS — Z3A38 38 weeks gestation of pregnancy: Secondary | ICD-10-CM

## 2019-06-06 DIAGNOSIS — Z3480 Encounter for supervision of other normal pregnancy, unspecified trimester: Secondary | ICD-10-CM

## 2019-06-06 NOTE — Progress Notes (Signed)
Subjective:  Jade Brown is a 28 y.o. G3P1011 at [redacted]w[redacted]d being seen today for ongoing prenatal care.  She is currently monitored for the following issues for this low-risk pregnancy and has Supervision of other normal pregnancy, antepartum; Rh negative status during pregnancy; and Group beta Strep positive on their problem list.  Patient reports no complaints.  Contractions: Irregular. Vag. Bleeding: None.  Movement: Present. Denies leaking of fluid.   The following portions of the patient's history were reviewed and updated as appropriate: allergies, current medications, past family history, past medical history, past social history, past surgical history and problem list. Problem list updated.  Objective:   Vitals:   06/06/19 1626  BP: 108/65  Pulse: 87  Weight: 197 lb (89.4 kg)    Fetal Status:     Movement: Present     General:  Alert, oriented and cooperative. Patient is in no acute distress.  Skin: Skin is warm and dry. No rash noted.   Cardiovascular: Normal heart rate noted  Respiratory: Normal respiratory effort, no problems with respiration noted  Abdomen: Soft, gravid, appropriate for gestational age. Pain/Pressure: Present     Pelvic:  Cervical exam performed      1-2 cm / 50% / -3 / Vtx  Extremities: Normal range of motion.  Edema: Trace  Mental Status: Normal mood and affect. Normal behavior. Normal judgment and thought content.   Urinalysis:      Assessment and Plan:  Pregnancy: G3P1011 at [redacted]w[redacted]d  1. Supervision of other normal pregnancy, antepartum   Term labor symptoms and general obstetric precautions including but not limited to vaginal bleeding, contractions, leaking of fluid and fetal movement were reviewed in detail with the patient. Please refer to After Visit Summary for other counseling recommendations.   Return in about 1 week (around 06/13/2019) for MyChart.   Shelly Bombard, MD 06/06/2019 4:49 PM

## 2019-06-06 NOTE — Progress Notes (Signed)
Pt is here for ROB. [redacted]w[redacted]d.  

## 2019-06-11 ENCOUNTER — Other Ambulatory Visit: Payer: Self-pay

## 2019-06-11 ENCOUNTER — Encounter (HOSPITAL_COMMUNITY): Payer: Self-pay

## 2019-06-11 ENCOUNTER — Inpatient Hospital Stay (HOSPITAL_COMMUNITY)
Admission: AD | Admit: 2019-06-11 | Discharge: 2019-06-11 | Disposition: A | Discharge status: Home / Self Care | Payer: Medicaid Other | Attending: Obstetrics & Gynecology | Admitting: Obstetrics & Gynecology

## 2019-06-11 ENCOUNTER — Other Ambulatory Visit: Payer: Self-pay | Admitting: Obstetrics and Gynecology

## 2019-06-11 DIAGNOSIS — M549 Dorsalgia, unspecified: Secondary | ICD-10-CM | POA: Insufficient documentation

## 2019-06-11 DIAGNOSIS — B9689 Other specified bacterial agents as the cause of diseases classified elsewhere: Secondary | ICD-10-CM

## 2019-06-11 DIAGNOSIS — O23593 Infection of other part of genital tract in pregnancy, third trimester: Secondary | ICD-10-CM | POA: Insufficient documentation

## 2019-06-11 DIAGNOSIS — Z3A39 39 weeks gestation of pregnancy: Secondary | ICD-10-CM | POA: Diagnosis not present

## 2019-06-11 DIAGNOSIS — B951 Streptococcus, group B, as the cause of diseases classified elsewhere: Secondary | ICD-10-CM

## 2019-06-11 DIAGNOSIS — O26893 Other specified pregnancy related conditions, third trimester: Secondary | ICD-10-CM | POA: Diagnosis not present

## 2019-06-11 DIAGNOSIS — N760 Acute vaginitis: Secondary | ICD-10-CM | POA: Diagnosis not present

## 2019-06-11 DIAGNOSIS — O99891 Other specified diseases and conditions complicating pregnancy: Secondary | ICD-10-CM

## 2019-06-11 DIAGNOSIS — Z6791 Unspecified blood type, Rh negative: Secondary | ICD-10-CM

## 2019-06-11 HISTORY — DX: Other specified bacterial agents as the cause of diseases classified elsewhere: B96.89

## 2019-06-11 LAB — POCT FERN TEST: POCT Fern Test: NEGATIVE

## 2019-06-11 LAB — WET PREP, GENITAL
Sperm: NONE SEEN
Trich, Wet Prep: NONE SEEN
Yeast Wet Prep HPF POC: NONE SEEN

## 2019-06-11 MED ORDER — CLINDAMYCIN PHOSPHATE (1 DOSE) 2 % VA CREA: 1.0000 | TOPICAL_CREAM | Freq: Every day | VAGINAL | 0 refills | Status: DC

## 2019-06-11 MED ORDER — CYCLOBENZAPRINE HCL 10 MG PO TABS
10.0000 mg | ORAL_TABLET | Freq: Once | ORAL | Status: AC
  Administered 2019-06-11: 10 mg via ORAL
  Filled 2019-06-11: qty 1

## 2019-06-11 MED ORDER — CYCLOBENZAPRINE HCL 10 MG PO TABS: 10.0000 mg | ORAL_TABLET | Freq: Two times a day (BID) | ORAL | 0 refills | Status: DC | PRN

## 2019-06-11 NOTE — MAU Note (Signed)
?   Leaking at 0130 this morning.  Was in the shower.  Has not had anymore leaking. Been feeling little pains, irreg. No bleeding. Was 1 cm last Wed when checked.

## 2019-06-11 NOTE — MAU Provider Note (Signed)
Subjective: Ms. LORISA SCHEID is a 28 y.o. G3P1011 at [redacted]w[redacted]d  who presents to MAU today complaining of leaked fluid @ 0130 this AM. She denies vaginal bleeding. She denies contractions. She reports normal fetal movement. She reports her "back is hurting so bad that she can't get comfortable."  Objective: BP 105/63 (BP Location: Left Arm)   Pulse 79   Temp 98.3 F (36.8 C) (Oral)   Resp 16   Ht 5\' 2"  (1.575 m)   Wt 91.5 kg   LMP 09/09/2018   SpO2 100%   BMI 36.91 kg/m   GENERAL: Well-developed, well-nourished female in no acute distress.  HEAD: Normocephalic, atraumatic.  CHEST: Normal effort of breathing, regular heart rate ABDOMEN: Soft, nontender, gravid PELVIC: Normal external female genitalia. Vagina is pink and rugated. Cervix with normal contour, no lesions. Moderate amount of normal discharge. No pooling.   Cervical exam:  Dilation: 1 Effacement (%): Thick Cervical Position: Posterior Presentation: Undeterminable Exam by:: jaton burgess rnc  Fetal Monitoring: Baseline: 135 Variability: moderate Accelerations: present Decelerations: none Contractions: occ. UC's  Results for orders placed or performed during the hospital encounter of 06/11/19 (from the past 24 hour(s))  Wet prep, genital     Status: Abnormal   Collection Time: 06/11/19 12:46 PM   Specimen: Vaginal  Result Value Ref Range   Yeast Wet Prep HPF POC NONE SEEN NONE SEEN   Trich, Wet Prep NONE SEEN NONE SEEN   Clue Cells Wet Prep HPF POC PRESENT (A) NONE SEEN   WBC, Wet Prep HPF POC MANY (A) NONE SEEN   Sperm NONE SEEN   POCT fern test     Status: None   Collection Time: 06/11/19  1:09 PM  Result Value Ref Range   POCT Fern Test Negative = intact amniotic membranes     MDM: Speculum Exam Fern Slide Wet Prep Flexeril 10 mg po -- resolved pain  Assessment/Plan: [facility] at [redacted]w[redacted]d gestation Membranes intact  Bacterial vaginosis  - Rx for Clindamycin 2% 1 applicatorful vaginally hs x 7 days -  Information provided on BV   Back pain affecting pregnancy in third trimester  - Rx for Flexeril 10 mg po BID prn back pain - Information provided on back pain in pregnancy    Discharge patient Keep scheduled appt with [facility] on 06/13/2019 Patient verbalized an understanding of the plan of care and agrees.     Laury Deep, CNM 06/11/2019, 12:46 PM

## 2019-06-11 NOTE — Discharge Instructions (Signed)
Back Pain in Pregnancy PLEASE WEAR THE MATERNITY SUPPORT BELT YOU WERE PRESCRIBED Back pain during pregnancy is common. Back pain may be caused by several factors that are related to changes during your pregnancy. Follow these instructions at home: Managing pain, stiffness, and swelling      If directed, for sudden (acute) back pain, put ice on the painful area. ? Put ice in a plastic bag. ? Place a towel between your skin and the bag. ? Leave the ice on for 20 minutes, 2-3 times per day.  If directed, apply heat to the affected area before you exercise. Use the heat source that your health care provider recommends, such as a moist heat pack or a heating pad. ? Place a towel between your skin and the heat source. ? Leave the heat on for 20-30 minutes. ? Remove the heat if your skin turns bright red. This is especially important if you are unable to feel pain, heat, or cold. You may have a greater risk of getting burned.  If directed, massage the affected area. Activity  Exercise as told by your health care provider. Gentle exercise is the best way to prevent or manage back pain.  Listen to your body when lifting. If lifting hurts, ask for help or bend your knees. This uses your leg muscles instead of your back muscles.  Squat down when picking up something from the floor. Do not bend over.  Only use bed rest for short periods as told by your health care provider. Bed rest should only be used for the most severe episodes of back pain. Standing, sitting, and lying down  Do not stand in one place for long periods of time.  Use good posture when sitting. Make sure your head rests over your shoulders and is not hanging forward. Use a pillow on your lower back if necessary.  Try sleeping on your side, preferably the left side, with a pregnancy support pillow or 1-2 regular pillows between your legs. ? If you have back pain after a night's rest, your bed may be too soft. ? A firm  mattress may provide more support for your back during pregnancy. General instructions  Do not wear high heels.  Eat a healthy diet. Try to gain weight within your health care provider's recommendations.  Use a maternity girdle, elastic sling, or back brace as told by your health care provider.  Take over-the-counter and prescription medicines only as told by your health care provider.  Work with a physical therapist or massage therapist to find ways to manage back pain. Acupuncture or massage therapy may be helpful.  Keep all follow-up visits as told by your health care provider. This is important. Contact a health care provider if:  Your back pain interferes with your daily activities.  You have increasing pain in other parts of your body. Get help right away if:  You develop numbness, tingling, weakness, or problems with the use of your arms or legs.  You develop severe back pain that is not controlled with medicine.  You have a change in bowel or bladder control.  You develop shortness of breath, dizziness, or you faint.  You develop nausea, vomiting, or sweating.  You have back pain that is a rhythmic, cramping pain similar to labor pains. Labor pain is usually 1-2 minutes apart, lasts for about 1 minute, and involves a bearing down feeling or pressure in your pelvis.  You have back pain and your water breaks or you have  vaginal bleeding.  You have back pain or numbness that travels down your leg.  Your back pain developed after you fell.  You develop pain on one side of your back.  You see blood in your urine.  You develop skin blisters in the area of your back pain. Summary  Back pain may be caused by several factors that are related to changes during your pregnancy.  Follow instructions as told by your health care provider for managing pain, stiffness, and swelling.  Exercise as told by your health care provider. Gentle exercise is the best way to prevent or  manage back pain.  Take over-the-counter and prescription medicines only as told by your health care provider.  Keep all follow-up visits as told by your health care provider. This is important. This information is not intended to replace advice given to you by your health care provider. Make sure you discuss any questions you have with your health care provider. Document Released: 11/03/2005 Document Revised: 11/14/2018 Document Reviewed: 01/11/2018 Elsevier Patient Education  2020 [employer] Bacterial Vaginosis  Bacterial vaginosis is an infection of the vagina. It happens when too many normal germs (healthy bacteria) grow in the vagina. This infection puts you at risk for infections from sex (STIs). Treating this infection can lower your risk for some STIs. You should also treat this if you are pregnant. It can cause your baby to be born early. Follow these instructions at home: Medicines  Take over-the-counter and prescription medicines only as told by your doctor.  Take or use your antibiotic medicine as told by your doctor. Do not stop taking or using it even if you start to feel better. General instructions  If you your sexual partner is a woman, tell her that you have this infection. She needs to get treatment if she has symptoms. If you have a female partner, he does not need to be treated.  During treatment: ? Avoid sex. ? Do not douche. ? Avoid alcohol as told. ? Avoid breastfeeding as told.  Drink enough fluid to keep your pee (urine) clear or pale yellow.  Keep your vagina and butt (rectum) clean. ? Wash the area with warm water every day. ? Wipe from front to back after you use the toilet.  Keep all follow-up visits as told by your doctor. This is important. Preventing this condition  Do not douche.  Use only warm water to wash around your vagina.  Use protection when you have sex. This includes: ? Latex condoms. ? Dental dams.  Limit how many people you  have sex with. It is best to only have sex with the same person (be monogamous).  Get tested for STIs. Have your partner get tested.  Wear underwear that is cotton or lined with cotton.  Avoid tight pants and pantyhose. This is most important in summer.  Do not use any products that have nicotine or tobacco in them. These include cigarettes and e-cigarettes. If you need help quitting, ask your doctor.  Do not use illegal drugs.  Limit how much alcohol you drink. Contact a doctor if:  Your symptoms do not get better, even after you are treated.  You have more discharge or pain when you pee (urinate).  You have a fever.  You have pain in your belly (abdomen).  You have pain with sex.  Your bleed from your vagina between periods. Summary  This infection happens when too many germs (bacteria) grow in the vagina.  Treating this condition  can lower your risk for some infections from sex (STIs).  You should also treat this if you are pregnant. It can cause early (premature) birth.  Do not stop taking or using your antibiotic medicine even if you start to feel better. This information is not intended to replace advice given to you by your health care provider. Make sure you discuss any questions you have with your health care provider. Document Released: 05/04/2008 Document Revised: 07/08/2017 Document Reviewed: 04/10/2016 Elsevier Patient Education  2020 [employer].

## 2019-06-11 NOTE — Progress Notes (Signed)
First Provider Initiated Contact with Patient 06/11/19 1236    S: [name] is a 28 y.o. G3P1011 at [redacted]w[redacted]d  who presents to MAU today complaining of leakage of fluid at 0130 with intermittent contractions. She denies vaginal bleeding. She endorses one episode of LOF. She reports normal fetal movement.    O: BP 112/63 (BP Location: Right Arm)   Pulse 86   Temp 98.1 F (36.7 C) (Oral)   Resp 16   Ht 5\' 2"  (1.575 m)   Wt 91.5 kg   LMP 09/09/2018   SpO2 100%   BMI 36.91 kg/m  GENERAL: Well-developed, well-nourished female in no acute distress.  HEAD: Normocephalic, atraumatic.  CHEST: Normal effort of breathing, regular heart rate ABDOMEN: Soft, nontender, gravid  Cervical exam:  Dilation: 1 Effacement (%): Thick Cervical Position: Posterior Presentation: Undeterminable Exam by:: jaton burgess rnc   Fetal Monitoring: Baseline: 135 Variability: moderate Accelerations: positive, 15x15 Decelerations: none Contractions: irregular, not very well traced   A: SIUP at [redacted]w[redacted]d  False labor  P: Discharge to home with labor precautions NST reactive as above No ferning or pooling on labor check  [name] L, DO 06/11/2019 1:33 PM

## 2019-06-13 ENCOUNTER — Encounter: Payer: Medicaid Other | Admitting: Obstetrics

## 2019-06-14 ENCOUNTER — Encounter (HOSPITAL_COMMUNITY): Payer: Self-pay

## 2019-06-14 ENCOUNTER — Inpatient Hospital Stay (HOSPITAL_COMMUNITY)
Admission: AD | Admit: 2019-06-14 | Discharge: 2019-06-16 | DRG: 807 | Disposition: A | Discharge status: Home / Self Care | Payer: Medicaid Other | Attending: Obstetrics & Gynecology | Admitting: Obstetrics & Gynecology

## 2019-06-14 ENCOUNTER — Other Ambulatory Visit: Payer: Self-pay

## 2019-06-14 DIAGNOSIS — Z20828 Contact with and (suspected) exposure to other viral communicable diseases: Secondary | ICD-10-CM | POA: Diagnosis present

## 2019-06-14 DIAGNOSIS — Z6791 Unspecified blood type, Rh negative: Secondary | ICD-10-CM

## 2019-06-14 DIAGNOSIS — N760 Acute vaginitis: Secondary | ICD-10-CM

## 2019-06-14 DIAGNOSIS — O26899 Other specified pregnancy related conditions, unspecified trimester: Secondary | ICD-10-CM

## 2019-06-14 DIAGNOSIS — O26893 Other specified pregnancy related conditions, third trimester: Principal | ICD-10-CM | POA: Diagnosis present

## 2019-06-14 DIAGNOSIS — B951 Streptococcus, group B, as the cause of diseases classified elsewhere: Secondary | ICD-10-CM | POA: Diagnosis present

## 2019-06-14 DIAGNOSIS — Z87891 Personal history of nicotine dependence: Secondary | ICD-10-CM | POA: Diagnosis not present

## 2019-06-14 DIAGNOSIS — Z3A39 39 weeks gestation of pregnancy: Secondary | ICD-10-CM

## 2019-06-14 DIAGNOSIS — Z3490 Encounter for supervision of normal pregnancy, unspecified, unspecified trimester: Secondary | ICD-10-CM

## 2019-06-14 DIAGNOSIS — O99824 Streptococcus B carrier state complicating childbirth: Secondary | ICD-10-CM | POA: Diagnosis present

## 2019-06-14 DIAGNOSIS — B9689 Other specified bacterial agents as the cause of diseases classified elsewhere: Secondary | ICD-10-CM

## 2019-06-14 LAB — RPR: RPR Ser Ql: NONREACTIVE

## 2019-06-14 LAB — CBC
HCT: 36.1 % (ref 36.0–46.0)
Hemoglobin: 11.6 g/dL — ABNORMAL LOW (ref 12.0–15.0)
MCH: 30.4 pg (ref 26.0–34.0)
MCHC: 32.1 g/dL (ref 30.0–36.0)
MCV: 94.5 fL (ref 80.0–100.0)
Platelets: 332 10*3/uL (ref 150–400)
RBC: 3.82 MIL/uL — ABNORMAL LOW (ref 3.87–5.11)
RDW: 14.7 % (ref 11.5–15.5)
WBC: 14.8 10*3/uL — ABNORMAL HIGH (ref 4.0–10.5)
nRBC: 0 % (ref 0.0–0.2)

## 2019-06-14 LAB — SARS CORONAVIRUS 2 BY RT PCR (HOSPITAL ORDER, PERFORMED IN ~~LOC~~ HOSPITAL LAB): SARS Coronavirus 2: NEGATIVE

## 2019-06-14 MED ORDER — PENICILLIN G POT IN DEXTROSE 60000 UNIT/ML IV SOLN
3.0000 10*6.[IU] | INTRAVENOUS | Status: DC
  Filled 2019-06-14: qty 50

## 2019-06-14 MED ORDER — DIBUCAINE (PERIANAL) 1 % EX OINT: 1.0000 "application " | TOPICAL_OINTMENT | CUTANEOUS | Status: DC | PRN

## 2019-06-14 MED ORDER — DIPHENHYDRAMINE HCL 25 MG PO CAPS: 25.0000 mg | ORAL_CAPSULE | Freq: Four times a day (QID) | ORAL | Status: DC | PRN

## 2019-06-14 MED ORDER — KETOROLAC TROMETHAMINE 30 MG/ML IJ SOLN: 30.0000 mg | Freq: Four times a day (QID) | INTRAMUSCULAR | Status: DC

## 2019-06-14 MED ORDER — COCONUT OIL OIL: 1.0000 "application " | TOPICAL_OIL | Status: DC | PRN

## 2019-06-14 MED ORDER — SODIUM CHLORIDE 0.9 % IV SOLN
5.0000 10*6.[IU] | Freq: Once | INTRAVENOUS | Status: AC
  Administered 2019-06-14: 08:00:00 5 10*6.[IU] via INTRAVENOUS
  Filled 2019-06-14: qty 5

## 2019-06-14 MED ORDER — OXYCODONE-ACETAMINOPHEN 5-325 MG PO TABS: 1.0000 | ORAL_TABLET | ORAL | Status: DC | PRN

## 2019-06-14 MED ORDER — WITCH HAZEL-GLYCERIN EX PADS: 1.0000 "application " | MEDICATED_PAD | CUTANEOUS | Status: DC | PRN

## 2019-06-14 MED ORDER — SIMETHICONE 80 MG PO CHEW: 80.0000 mg | CHEWABLE_TABLET | ORAL | Status: DC | PRN

## 2019-06-14 MED ORDER — LACTATED RINGERS IV SOLN: 500.0000 mL | INTRAVENOUS | Status: DC | PRN

## 2019-06-14 MED ORDER — OXYCODONE HCL 5 MG PO TABS
5.0000 mg | ORAL_TABLET | ORAL | Status: DC | PRN
  Administered 2019-06-14: 5 mg via ORAL
  Filled 2019-06-14: qty 1

## 2019-06-14 MED ORDER — PRENATAL MULTIVITAMIN CH
1.0000 | ORAL_TABLET | Freq: Every day | ORAL | Status: DC
  Administered 2019-06-14 – 2019-06-15 (×2): 1 via ORAL
  Filled 2019-06-14 (×3): qty 1

## 2019-06-14 MED ORDER — ONDANSETRON HCL 4 MG PO TABS
4.0000 mg | ORAL_TABLET | ORAL | Status: DC | PRN
  Administered 2019-06-14: 17:00:00 4 mg via ORAL
  Filled 2019-06-14: qty 1

## 2019-06-14 MED ORDER — LACTATED RINGERS IV SOLN: INTRAVENOUS | Status: DC

## 2019-06-14 MED ORDER — SOD CITRATE-CITRIC ACID 500-334 MG/5ML PO SOLN: 30.0000 mL | ORAL | Status: DC | PRN

## 2019-06-14 MED ORDER — OXYCODONE-ACETAMINOPHEN 5-325 MG PO TABS: 2.0000 | ORAL_TABLET | ORAL | Status: DC | PRN

## 2019-06-14 MED ORDER — OXYTOCIN 40 UNITS IN NORMAL SALINE INFUSION - SIMPLE MED
2.5000 [IU]/h | INTRAVENOUS | Status: DC
  Filled 2019-06-14: qty 1000

## 2019-06-14 MED ORDER — LIDOCAINE HCL (PF) 1 % IJ SOLN: 30.0000 mL | INTRAMUSCULAR | Status: DC | PRN

## 2019-06-14 MED ORDER — KETOROLAC TROMETHAMINE 30 MG/ML IJ SOLN
30.0000 mg | Freq: Four times a day (QID) | INTRAMUSCULAR | Status: DC | PRN
  Administered 2019-06-14: 30 mg via INTRAVENOUS
  Filled 2019-06-14: qty 1

## 2019-06-14 MED ORDER — FENTANYL CITRATE (PF) 100 MCG/2ML IJ SOLN
INTRAMUSCULAR | Status: AC
  Filled 2019-06-14: qty 2

## 2019-06-14 MED ORDER — FENTANYL CITRATE (PF) 100 MCG/2ML IJ SOLN
100.0000 ug | INTRAMUSCULAR | Status: DC | PRN
  Administered 2019-06-14: 100 ug via INTRAVENOUS

## 2019-06-14 MED ORDER — TETANUS-DIPHTH-ACELL PERTUSSIS 5-2.5-18.5 LF-MCG/0.5 IM SUSP: 0.5000 mL | Freq: Once | INTRAMUSCULAR | Status: DC

## 2019-06-14 MED ORDER — OXYTOCIN BOLUS FROM INFUSION
500.0000 mL | Freq: Once | INTRAVENOUS | Status: AC
  Administered 2019-06-14: 09:00:00 500 mL via INTRAVENOUS

## 2019-06-14 MED ORDER — BENZOCAINE-MENTHOL 20-0.5 % EX AERO
1.0000 "application " | INHALATION_SPRAY | CUTANEOUS | Status: DC | PRN
  Administered 2019-06-14: 1 via TOPICAL
  Filled 2019-06-14: qty 56

## 2019-06-14 MED ORDER — ZOLPIDEM TARTRATE 5 MG PO TABS: 5.0000 mg | ORAL_TABLET | Freq: Every evening | ORAL | Status: DC | PRN

## 2019-06-14 MED ORDER — ACETAMINOPHEN 325 MG PO TABS: 650.0000 mg | ORAL_TABLET | ORAL | Status: DC | PRN

## 2019-06-14 MED ORDER — ACETAMINOPHEN 325 MG PO TABS
650.0000 mg | ORAL_TABLET | ORAL | Status: DC | PRN
  Administered 2019-06-14: 10:00:00 650 mg via ORAL
  Filled 2019-06-14: qty 2

## 2019-06-14 MED ORDER — IBUPROFEN 600 MG PO TABS
600.0000 mg | ORAL_TABLET | Freq: Four times a day (QID) | ORAL | Status: DC
  Administered 2019-06-14 – 2019-06-16 (×4): 600 mg via ORAL
  Filled 2019-06-14 (×8): qty 1

## 2019-06-14 MED ORDER — SENNOSIDES-DOCUSATE SODIUM 8.6-50 MG PO TABS
2.0000 | ORAL_TABLET | ORAL | Status: DC
  Administered 2019-06-14 – 2019-06-15 (×2): 2 via ORAL
  Filled 2019-06-14 (×2): qty 2

## 2019-06-14 MED ORDER — FENTANYL CITRATE (PF) 100 MCG/2ML IJ SOLN
100.0000 ug | Freq: Once | INTRAMUSCULAR | Status: AC
  Administered 2019-06-14: 06:00:00 100 ug via INTRAVENOUS
  Filled 2019-06-14: qty 2

## 2019-06-14 MED ORDER — ONDANSETRON HCL 4 MG/2ML IJ SOLN: 4.0000 mg | INTRAMUSCULAR | Status: DC | PRN

## 2019-06-14 MED ORDER — ONDANSETRON HCL 4 MG/2ML IJ SOLN
4.0000 mg | Freq: Four times a day (QID) | INTRAMUSCULAR | Status: DC | PRN
  Administered 2019-06-14: 09:00:00 4 mg via INTRAVENOUS
  Filled 2019-06-14: qty 2

## 2019-06-14 MED ORDER — LACTATED RINGERS IV SOLN
INTRAVENOUS | Status: DC
  Administered 2019-06-14: 06:00:00 via INTRAVENOUS

## 2019-06-14 NOTE — MAU Note (Addendum)
Presents with cxts starting around 2300 yesterday, approx q 5 min apart. Dec FM. No vag bldg or LOF. Pt wants to delay cord clamping until stop pulsating and wants to deliver placenta with baby still attached.    Gilmer Mor RN

## 2019-06-14 NOTE — H&P (Addendum)
LABOR AND DELIVERY ADMISSION HISTORY AND PHYSICAL NOTE  Jade Brown is a 28 y.o. female G36P1011 with IUP at 62w5dby LMP  presenting for SOL.   Patient in moderate to severe distress with contractions.    She reports positive fetal movement. She denies leakage of fluid or vaginal bleeding.  Prenatal History/Complications:  Past Medical History: Past Medical History:  Diagnosis Date  . Gallstones     Past Surgical History: Past Surgical History:  Procedure Laterality Date  . CHOLECYSTECTOMY N/A 12/04/2013   Procedure: LAPAROSCOPIC CHOLECYSTECTOMY;  Surgeon: ARalene Ok MD;  Location: [facility]  Service: General;  Laterality: N/A;  . DILATION AND CURETTAGE OF UTERUS    . WISDOM TOOTH EXTRACTION      Obstetrical History: OB History    Gravida  3   Para  1   Term  1   Preterm      AB  1   Living  1     SAB      TAB  1   Ectopic      Multiple      Live Births  1           Social History: Social History   Socioeconomic History  . Marital status: Single    Spouse name: Not on file  . Number of children: Not on file  . Years of education: Not on file  . Highest education level: Not on file  Occupational History  . Not on file  Social Needs  . Financial resource strain: Not on file  . Food insecurity    Worry: Not on file    Inability: Not on file  . Transportation needs    Medical: Not on file    Non-medical: Not on file  Tobacco Use  . Smoking status: Former Smoker    Years: 2.00    Types: Cigars    Quit date: 01/12/2011    Years since quitting: 8.4  . Smokeless tobacco: Never Used  Substance and Sexual Activity  . Alcohol use: No  . Drug use: No  . Sexual activity: Not Currently    Partners: Male    Birth control/protection: None  Lifestyle  . Physical activity    Days per week: Not on file    Minutes per session: Not on file  . Stress: Not on file  Relationships  . Social [place] phone: Not on file    Gets  together: Not on file    Attends religious service: Not on file    Active member of club or organization: Not on file    Attends meetings of clubs or organizations: Not on file    Relationship status: Not on file  Other Topics Concern  . Not on file  Social History Narrative  . Not on file    Family History: Family History  Problem Relation Age of Onset  . Hypertension Mother   . Diabetes Mother   . Diabetes Maternal Grandmother     Allergies: Allergies  Allergen Reactions  . Flagyl [Metronidazole] Nausea And Vomiting    Medications Prior to Admission  Medication Sig Dispense Refill Last Dose  . clindamycin (CLEOCIN) 2 % vaginal cream APPLY 1 APPLICATORFUL TOPICALLY AT BEDTIME. 40 g 0   . cyclobenzaprine (FLEXERIL) 10 MG tablet Take 1 tablet (10 mg total) by mouth 2 (two) times daily as needed for muscle spasms. 20 tablet 0   . Elastic Bandages & Supports (COMFORT FIT  MATERNITY SUPP MED) MISC 1 Device by Does not apply route daily. 1 each 0   . metoCLOPramide (REGLAN) 10 MG tablet Take 1 tablet (10 mg total) by mouth 3 (three) times daily before meals. 30 tablet 0   . ondansetron (ZOFRAN ODT) 4 MG disintegrating tablet Take 1 tablet (4 mg total) by mouth every 8 (eight) hours as needed for nausea or vomiting. 30 tablet 1   . pantoprazole (PROTONIX) 20 MG tablet Take 1 tablet (20 mg total) by mouth daily. 30 tablet 2   . promethazine (PHENERGAN) 25 MG tablet Take 1 tablet (25 mg total) by mouth at bedtime as needed for nausea or vomiting. 30 tablet 2   . Blood Pressure Monitoring KIT 1 kit by Does not apply route once a week. 1 kit 0   . Prenat w/o A-FeCbn-Meth-FA-DHA (PRENATE MINI) 29-0.6-0.4-350 MG CAPS Take 1 tablet by mouth daily. 60 capsule 3   . Prenatal Vit-Fe Fumarate-FA (MULTIVITAMIN-PRENATAL) 27-0.8 MG TABS tablet Take 1 tablet by mouth daily at 12 noon.        Review of Systems   All systems reviewed and negative except as stated in HPI  Blood pressure 133/69,  pulse 78, temperature 98.2 F (36.8 C), temperature source Oral, resp. rate 14, last menstrual period 09/09/2018, SpO2 100 %. General appearance: alert, cooperative and moderate distressto severe distress with contractions.  Heart: regular rate and rhythm Extremities: No calf swelling or tenderness Presentation: cephalic Fetal monitoring: 130 bpm, + acels, no decels  Uterine activity: Hard to trace Dilation: (svd of viable female ) Effacement (%): 100 Station: 0 Exam by:: Limmie Patricia, RNC   Prenatal labs: ABO, Rh: --/--/A NEG (11/05 0615) Antibody: POS (11/05 0615) Rubella: 1.19 (04/22 0942) RPR: Non Reactive (08/12 0912)  HBsAg: Negative (04/22 0942)  HIV: Non Reactive (08/12 0912)  GBS: --Tessie Fass (10/06 0425)  GTT: Normal Genetic screening: Normal Anatomy US: Normal  Prenatal Transfer Tool  Maternal Diabetes: No Genetic Screening: Normal Maternal Ultrasounds/Referrals: Normal Fetal Ultrasounds or other Referrals:  None Maternal Substance Abuse:  No Significant Maternal Medications:  None Significant Maternal Lab Results: Group B Strep positive  Results for orders placed or performed during the hospital encounter of 06/14/19 (from the past 24 hour(s))  Type and screen [place]   Collection Time: 06/14/19  6:15 AM  Result Value Ref Range   ABO/RH(D) A NEG    Antibody Screen POS    Sample Expiration 06/17/2019,2359    Antibody Identification PASSIVELY ACQUIRED ANTI-D    Unit Number [id number]    Blood Component Type RED CELLS,LR    Unit division 00    Status of Unit ALLOCATED    Transfusion Status OK TO TRANSFUSE    Crossmatch Result COMPATIBLE    Unit Number [id number]    Blood Component Type RED CELLS,LR    Unit division 00    Status of Unit ALLOCATED    Transfusion Status OK TO TRANSFUSE    Crossmatch Result COMPATIBLE   BPAM RBC   Collection Time: 06/14/19  6:15 AM  Result Value Ref Range   Blood Product Unit Number  [id number]    PRODUCT CODE [id number]    Unit Type and Rh 0600    Blood Product Expiration Date 737106269485    Blood Product Unit Number [id number]    PRODUCT CODE [id number]    Unit Type and Rh 0600    Blood Product Expiration Date 967893810175   SARS Coronavirus 2 by RT PCR (hospital order,  performed in [place] hospital lab)   Collection Time: 06/14/19  6:25 AM  Result Value Ref Range   SARS Coronavirus 2 NEGATIVE NEGATIVE  CBC   Collection Time: 06/14/19  8:22 AM  Result Value Ref Range   WBC 14.8 (H) 4.0 - 10.5 K/uL   RBC 3.82 (L) 3.87 - 5.11 MIL/uL   Hemoglobin 11.6 (L) 12.0 - 15.0 g/dL   HCT 36.1 36.0 - 46.0 %   MCV 94.5 80.0 - 100.0 fL   MCH 30.4 26.0 - 34.0 pg   MCHC 32.1 30.0 - 36.0 g/dL   RDW 14.7 11.5 - 15.5 %   Platelets 332 150 - 400 K/uL   nRBC 0.0 0.0 - 0.2 %    Patient Active Problem List   Diagnosis Date Noted  . Supervision of normal pregnancy 06/14/2019  . Bacterial vaginosis 06/11/2019  . Group beta Strep positive 05/17/2019  . Rh negative status during pregnancy 12/01/2018  . Supervision of other normal pregnancy, antepartum 11/29/2018    Assessment: Jade Brown is a 28 y.o. G3P1011 at 75w5dhere for SOL.  #Labor: Progressing well.  We will continue to monitor #Pain: Patient requested IV pain medicine #FWB: Category 1, reassuring #ID:  GBS positive, penicillin given _0  #MOF: Breast #MOC: None  VGifford Shave11/12/2018, 9:48 AM  I was present for the exam and agree with above. Pt 4.5/70/-3 on admission. Progressed quickly w/out Augmentation.   STamala Julian V[place]ermont CSumpter11/12/2018 10:07 AM

## 2019-06-14 NOTE — Discharge Summary (Signed)
Postpartum Discharge Summary     Patient Name: Jade Brown DOB: May 17, 1991 MRN: [id number]  Date of admission: 06/14/2019 Delivering Provider: Manya Silvas   Date of discharge: 06/16/2019  Admitting diagnosis: 39WKS CTX Intrauterine pregnancy: [redacted]w[redacted]d    Secondary diagnosis:  Active Problems:   Rh negative status during pregnancy   Group beta Strep positive   Supervision of normal pregnancy  Additional problems: Meconium Stained Fluid     Discharge diagnosis: Term Pregnancy Delivered                                                                                                Post partum procedures:rhogam  Augmentation: None  Complications: None  Hospital course:  Onset of Labor With Vaginal Delivery     28y.o. yo G3P1011 at 373w5das admitted in Latent Labor on 06/14/2019. Patient had an uncomplicated labor course as follows:  Membrane Rupture Time/Date: 9:14 AM ,06/14/2019   Intrapartum Procedures: Episiotomy:                                          Lacerations:  None [1]  Patient had a delivery of a Viable infant. 06/14/2019  Information for the patient's newborn:  MoReginna, Sermeno0[[id number]]Delivery Method: Vaginal, Spontaneous(Filed from Delivery Summary)     Pateint had an uncomplicated postpartum course.  She is ambulating, tolerating a regular diet, passing flatus, and urinating well. Patient is discharged home in stable condition on 06/16/19.  Delivery time: 9:23 AM    Magnesium Sulfate received: No BMZ received: No Rhophylac:Yes MMR:N/A Transfusion:No  Physical exam  Vitals:   06/15/19 0512 06/15/19 1414 06/15/19 2151 06/16/19 0536  BP: 122/77 107/69 116/76 93/60  Pulse: 74 68 (!) 57 67  Resp: _0 Temp: 98.2 F (36.8 C) 98.9 F (37.2 C) 98.6 F (37 C) 98.5 F (36.9 C)  TempSrc: Oral Oral Oral Oral  SpO2: 100% 100% 99%    General: alert, cooperative and no distress Lochia: appropriate Uterine Fundus: firm Incision: N/A DVT  Evaluation: No evidence of DVT seen on physical exam. Labs: Lab Results  Component Value Date   WBC 18.9 (H) 06/15/2019   HGB 10.4 (L) 06/15/2019   HCT 31.8 (L) 06/15/2019   MCV 93.5 06/15/2019   PLT 297 06/15/2019   CMP Latest Ref Rng & Units 12/01/2013  Glucose 70 - 99 mg/dL 96  BUN 6 - 23 mg/dL 8  Creatinine 0.50 - 1.10 mg/dL 0.89  Sodium 137 - 147 mEq/L 141  Potassium 3.7 - 5.3 mEq/L 3.6(L)  Chloride 96 - 112 mEq/L 105  CO2 19 - 32 mEq/L 22  Calcium 8.4 - 10.5 mg/dL 9.3  Total Protein 6.0 - 8.3 g/dL 7.1  Total Bilirubin 0.3 - 1.2 mg/dL 0.2(L)  Alkaline Phos 39 - 117 U/L 84  AST 0 - 37 U/L 33  ALT 0 - 35 U/L 36(H)    Discharge instruction: per After Visit Summary and "Baby and Me  Booklet".  After visit meds:  Allergies as of 06/16/2019      Reactions   Flagyl [metronidazole] Nausea And Vomiting      Medication List    STOP taking these medications   Blood Pressure Monitoring Kit   clindamycin 2 % vaginal cream Commonly known as: [place] Supp Med Misc   cyclobenzaprine 10 MG tablet Commonly known as: FLEXERIL   metoCLOPramide 10 MG tablet Commonly known as: REGLAN   ondansetron 4 MG disintegrating tablet Commonly known as: Zofran ODT   pantoprazole 20 MG tablet Commonly known as: Protonix   Prenate Mini 29-0.6-0.4-350 MG Caps   promethazine 25 MG tablet Commonly known as: PHENERGAN     TAKE these medications   acetaminophen 325 MG tablet Commonly known as: Tylenol Take 2 tablets (650 mg total) by mouth every 4 (four) hours as needed (for pain scale < 4).   ibuprofen 600 MG tablet Commonly known as: ADVIL Take 1 tablet (600 mg total) by mouth every 6 (six) hours.   multivitamin-prenatal 27-0.8 MG Tabs tablet Take 1 tablet by mouth daily at 12 noon.       Diet: routine diet  Activity: Advance as tolerated. Pelvic rest for 6 weeks.   Outpatient follow up:4-6 week PPV  Follow up Appt: Future Appointments  Date  Time Provider [place]  07/12/2019  9:15 AM Shelly Bombard, MD [facility] None   Follow up Visit:   Please schedule this patient for Postpartum visit in: 4 weeks with the following provider: Any provider For C/S patients schedule nurse incision check in weeks 2 weeks: NA Low risk pregnancy complicated by: Nothing Delivery mode:  SVD Anticipated Birth Control:  other/unsure PP Procedures needed: None  Schedule Integrated BH visit: no  Newborn Data: Live born female  Birth Weight: 2780  APGAR: 62, 9  Newborn Delivery   Birth date/time: 06/14/2019 09:23:00 Delivery type: Vaginal, Spontaneous      Baby Feeding: Breast Disposition:home with mother   06/16/2019 Gifford Shave, MD

## 2019-06-14 NOTE — MAU Note (Signed)
Covid swab obtained without difficulty and pt tol well. No symptoms 

## 2019-06-15 LAB — CBC
HCT: 31.8 % — ABNORMAL LOW (ref 36.0–46.0)
Hemoglobin: 10.4 g/dL — ABNORMAL LOW (ref 12.0–15.0)
MCH: 30.6 pg (ref 26.0–34.0)
MCHC: 32.7 g/dL (ref 30.0–36.0)
MCV: 93.5 fL (ref 80.0–100.0)
Platelets: 297 10*3/uL (ref 150–400)
RBC: 3.4 MIL/uL — ABNORMAL LOW (ref 3.87–5.11)
RDW: 14.6 % (ref 11.5–15.5)
WBC: 18.9 10*3/uL — ABNORMAL HIGH (ref 4.0–10.5)
nRBC: 0 % (ref 0.0–0.2)

## 2019-06-15 MED ORDER — RHO D IMMUNE GLOBULIN 1500 UNIT/2ML IJ SOSY
300.0000 ug | PREFILLED_SYRINGE | Freq: Once | INTRAMUSCULAR | Status: AC
  Administered 2019-06-15: 10:00:00 300 ug via INTRAVENOUS
  Filled 2019-06-15: qty 2

## 2019-06-15 NOTE — Progress Notes (Signed)
Pt has declined last two doses of scheduled pain medication.  Discussed with pt dose would be held and she could out if she changed her mind.

## 2019-06-15 NOTE — Progress Notes (Signed)
Post Partum Day 1 Subjective: no complaints, up ad lib, voiding and tolerating PO, small lochia, plans to breastfeed, no method Will discuss at [facility] visit--doesn't want anything right now Objective: Blood pressure 122/77, pulse 74, temperature 98.2 F (36.8 C), temperature source Oral, resp. rate 18, last menstrual period 09/09/2018, SpO2 100 %, unknown if currently breastfeeding.  Physical Exam:  General: alert, cooperative and no distress Lochia:normal flow Chest: CTAB Heart: RRR no m/r/g Abdomen: +BS, soft, nontender,  Uterine Fundus: firm DVT Evaluation: No evidence of DVT seen on physical exam. Extremities: trace edema  Recent Labs    06/14/19 0822 06/15/19 0601  HGB 11.6* 10.4*  HCT 36.1 31.8*    Assessment/Plan: Plan for discharge tomorrow   LOS: 1 day   Jade Brown 06/15/2019, 7:45 AM

## 2019-06-16 LAB — RH IG WORKUP (INCLUDES ABO/RH)
ABO/RH(D): A NEG
Fetal Screen: NEGATIVE
Gestational Age(Wks): 39.5
Unit division: 0

## 2019-06-16 MED ORDER — ACETAMINOPHEN 325 MG PO TABS: 650.0000 mg | ORAL_TABLET | ORAL | Status: DC | PRN

## 2019-06-16 MED ORDER — IBUPROFEN 600 MG PO TABS: 600.0000 mg | ORAL_TABLET | Freq: Four times a day (QID) | ORAL | 0 refills | Status: DC

## 2019-06-16 NOTE — Lactation Note (Signed)
This note was copied from a baby's chart. Lactation Consultation Note Baby 66 hrs old. Cluster feeding. Mom stated baby BF well. Mrs. Lewie Loron worked w/her today. Mom BF her now 28 yr old for 6 months. Mom has everted nipples. Denies painful latches. Mom demonstrated hand expression w/colostrum noted.  Mom encouraged to feed baby 8-12 times/24 hours and with feeding cues. Newborn feeding habits discussed. Mom mentions different feeding positions she has been using. Encouraged breast massage at intervals during feeding. Mom taking out hair extensions.  Encouraged to call for assistance if needed.   Patient Name: Jade Brown [id number] Date: 06/16/2019 Reason for consult: Follow-up assessment   Maternal Data    Feeding Feeding Type: Breast Fed  LATCH Score Latch: Grasps breast easily, tongue down, lips flanged, rhythmical sucking.  Audible Swallowing: Spontaneous and intermittent  Type of Nipple: Everted at rest and after stimulation  Comfort (Breast/Nipple): Soft / non-tender  Hold (Positioning): No assistance needed to correctly position infant at breast.  LATCH Score: 10  Interventions    Lactation Tools Discussed/Used     Consult Status Consult Status: Follow-up Date: 06/16/19 Follow-up type: In-patient    Theodoro Kalata 06/16/2019, 1:58 AM

## 2019-06-16 NOTE — Discharge Instructions (Signed)

## 2019-06-16 NOTE — Lactation Note (Signed)
This note was copied from a baby's chart. Lactation Consultation Note  Patient Name: Jade Brown Date: 06/16/2019 Reason for consult: Follow-up assessment;Infant weight loss;Term Baby is 34 hours old / 6 % weight loss.  Mom mentioned the Bilirubin may be rechecked again prior to D/C .  Mom denies soreness, sore nipple and engorgement prevention and tx reviewed.  Per mom was given a hand pump and was instructed by the previous LC.  LC discussed importance of STS feedings until the baby is back to birth weight, gaining steadily and can stay awake for majority of feeding.  Nutritive vs non - nutritive feeding patterns and the importance of watching the baby for hanging out latched.  Storage of breast milk reviewed.  Mom has the [facility] pamphlet with phone numbers.     Maternal Data    Feeding Feeding Type: (baby recently fed)  LATCH Score                   Interventions Interventions: Breast feeding basics reviewed;Hand pump  Lactation Tools Discussed/Used Tools: Pump Breast pump type: Manual Pump Review: Milk Storage   Consult Status Consult Status: Complete Date: 06/16/19    Jerlyn Ly Jamont Mellin 06/16/2019, 10:14 AM

## 2019-06-18 LAB — TYPE AND SCREEN
ABO/RH(D): A NEG
Antibody Screen: POSITIVE
Unit division: 0
Unit division: 0

## 2019-06-18 LAB — BPAM RBC
Blood Product Expiration Date: 202011282359
Blood Product Expiration Date: 202012032359
Unit Type and Rh: 600
Unit Type and Rh: 600

## 2019-07-12 ENCOUNTER — Telehealth: Payer: Medicaid Other | Admitting: Obstetrics

## 2019-07-24 ENCOUNTER — Telehealth: Payer: Self-pay | Admitting: Obstetrics

## 2019-11-06 ENCOUNTER — Encounter (HOSPITAL_COMMUNITY): Payer: Self-pay

## 2019-11-06 ENCOUNTER — Ambulatory Visit (HOSPITAL_COMMUNITY)
Admission: EM | Admit: 2019-11-06 | Discharge: 2019-11-06 | Disposition: A | Discharge status: Home / Self Care | Payer: Medicaid Other | Attending: Emergency Medicine | Admitting: Emergency Medicine

## 2019-11-06 DIAGNOSIS — M654 Radial styloid tenosynovitis [de Quervain]: Secondary | ICD-10-CM | POA: Diagnosis not present

## 2019-11-06 MED ORDER — PREDNISONE 20 MG PO TABS: 20.0000 mg | ORAL_TABLET | Freq: Every day | ORAL | 0 refills | Status: AC

## 2019-11-06 NOTE — Discharge Instructions (Signed)
Please wear thumb spica wrist brace consistently over the next 2 weeks and at work Begin prednisone 20 mg daily for the next week, take with food and in the morning if you are able, this dose is safe with breast-feeding After finishing prednisone may restart Tylenol/ibuprofen for pain and swelling  Please follow-up if not seeing any improvement or worsening with symptoms with the above

## 2019-11-06 NOTE — ED Provider Notes (Signed)
[facility]    CSN: [id number] Arrival date & time: 11/06/19  1150      History   Chief Complaint Chief Complaint  Patient presents with  . Arm Pain    HPI Jade Brown is a 29 y.o. female 4 months postpartum presenting today for evaluation of right hand and wrist pain.  Patient states that over the past month she has had increasingly worsening pain in her wrist.  Most prominent pain at base of thumb and extends into forearm.  Denies any fall injury or trauma.  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  Reports tingling sensations.  Feels as if this is affecting her entire arm now and is feeling weakness.  Denies history of similar.  Denies prior fractures to this extremity.  HPI  Past Medical History:  Diagnosis Date  . Gallstones     Patient Active Problem List   Diagnosis Date Noted  . Supervision of normal pregnancy 06/14/2019  . Bacterial vaginosis 06/11/2019  . Group beta Strep positive 05/17/2019  . Rh negative status during pregnancy 12/01/2018  . Supervision of other normal pregnancy, antepartum 11/29/2018    Past Surgical History:  Procedure Laterality Date  . CHOLECYSTECTOMY N/A 12/04/2013   Procedure: LAPAROSCOPIC CHOLECYSTECTOMY;  Surgeon: Ralene Ok, MD;  Location: [facility];  Service: General;  Laterality: N/A;  . DILATION AND CURETTAGE OF UTERUS    . WISDOM TOOTH EXTRACTION      OB History    Gravida  3   Para  2   Term  2   Preterm      AB  1   Living  2     SAB      TAB  1   Ectopic      Multiple  0   Live Births  2            Home Medications    Prior to Admission medications   Medication Sig Start Date End Date Taking? Authorizing Provider  acetaminophen (TYLENOL) 325 MG tablet Take 2 tablets (650 mg total) by mouth every 4 (four) hours as needed (for pain scale < 4). 06/16/19   Gifford Shave, MD  ibuprofen (ADVIL) 600 MG tablet Take 1 tablet (600 mg total) by mouth every 6 (six) hours.  06/16/19   Gifford Shave, MD  predniSONE (DELTASONE) 20 MG tablet Take 1 tablet (20 mg total) by mouth daily with breakfast for 7 days. 11/06/19 11/13/19  Dam Ashraf C, PA-C  Prenatal Vit-Fe Fumarate-FA (MULTIVITAMIN-PRENATAL) 27-0.8 MG TABS tablet Take 1 tablet by mouth daily at 12 noon.    [provider]    Family History Family History  Problem Relation Age of Onset  . Hypertension Mother   . Diabetes Mother   . Diabetes Maternal Grandmother     Social History Social History   Tobacco Use  . Smoking status: Former Smoker    Years: 2.00    Types: Cigars    Quit date: 01/12/2011    Years since quitting: 8.8  . Smokeless tobacco: Never Used  Substance Use Topics  . Alcohol use: No  . Drug use: No     Allergies   Flagyl [metronidazole]   Review of Systems Review of Systems  Constitutional: Negative for fatigue and fever.  Eyes: Negative for visual disturbance.  Respiratory: Negative for shortness of breath.   Cardiovascular: Negative for chest pain.  Gastrointestinal: Negative for abdominal pain, nausea and vomiting.  Musculoskeletal: Positive  for joint swelling. Negative for arthralgias.  Skin: Negative for color change, rash and wound.  Neurological: Negative for dizziness, weakness, light-headedness and headaches.     Physical Exam Triage Vital Signs ED Triage Vitals  Enc Vitals Group     BP 11/06/19 1237 102/69     Pulse Rate 11/06/19 1237 61     Resp 11/06/19 1237 18     Temp 11/06/19 1237 98.1 F (36.7 C)     Temp Source 11/06/19 1237 Oral     SpO2 11/06/19 1237 99 %     Weight --      Height --      Head Circumference --      Peak Flow --      Pain Score 11/06/19 1235 10     Pain Loc --      Pain Edu? --      Excl. in [place]? --    No data found.  Updated Vital Signs BP 102/69 (BP Location: Left Arm)   Pulse 61   Temp 98.1 F (36.7 C) (Oral)   Resp 18   LMP 11/02/2019   SpO2 99%   Visual Acuity Right Eye Distance:   Left Eye  Distance:   Bilateral Distance:    Right Eye Near:   Left Eye Near:    Bilateral Near:     Physical Exam Vitals and nursing note reviewed.  Constitutional:      Appearance: She is well-developed.     Comments: No acute distress  HENT:     Head: Normocephalic and atraumatic.     Nose: Nose normal.  Eyes:     Conjunctiva/sclera: Conjunctivae normal.  Cardiovascular:     Rate and Rhythm: Normal rate.  Pulmonary:     Effort: Pulmonary effort is normal. No respiratory distress.  Abdominal:     General: There is no distension.  Musculoskeletal:        General: Normal range of motion.     Cervical back: Neck supple.     Comments: Right hand/wrist: No obvious deformity or discoloration noted to right hand/wrist, mild swelling compared to left, tender to palpation to distal radius extending into first metacarpal, diffuse tenderness throughout proximal metacarpals and carpals, nontender to distal ulna Positive Finkelstein's  Full active range of motion at elbow Radial pulse 2+  Skin:    General: Skin is warm and dry.  Neurological:     Mental Status: She is alert and oriented to person, place, and time.      UC Treatments / Results  Labs (all labs ordered are listed, but only abnormal results are displayed) Labs Reviewed - No data to display  EKG   Radiology No results found.  Procedures Procedures (including critical care time)  Medications Ordered in UC Medications - No data to display  Initial Impression / Assessment and Plan / UC Course  I have reviewed the triage vital signs and the nursing notes.  Pertinent labs & imaging results that were available during my care of the patient were reviewed by me and considered in my medical decision making (see chart for details).     Exam and history suggestive of de Quervain's tenosynovitis.  Placed in thumb spica and recommend anti-inflammatories.  Patient is breast-feeding, will proceed with low-dose prednisone at 20  mg daily for 1 week.  Followed by returning to Tylenol and ibuprofen.  Ice and rest.  Follow-up as symptoms not improving or worsening.  Discussed strict return precautions. Patient verbalized understanding  and is agreeable with plan.  Final Clinical Impressions(s) / UC Diagnoses   Final diagnoses:  Tenosynovitis, de Quervain     Discharge Instructions     Please wear thumb spica wrist brace consistently over the next 2 weeks and at work Begin prednisone 20 mg daily for the next week, take with food and in the morning if you are able, this dose is safe with breast-feeding After finishing prednisone may restart Tylenol/ibuprofen for pain and swelling  Please follow-up if not seeing any improvement or worsening with symptoms with the above   ED Prescriptions    Medication Sig Dispense Auth. Provider   predniSONE (DELTASONE) 20 MG tablet Take 1 tablet (20 mg total) by mouth daily with breakfast for 7 days. 7 tablet Precious Gilchrest, Woodruff C, PA-C     PDMP not reviewed this encounter.   Lew Dawes, PA-C 11/06/19 1356

## 2019-11-06 NOTE — ED Triage Notes (Signed)
Pt c/o right wrist/hand pain for approx 1 month, now states pain increasing and radiating to right forearm and unable to straighten/curl right fingers completely w/o pain. 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  Denies trauma/injury to area.

## 2019-11-27 ENCOUNTER — Encounter (HOSPITAL_COMMUNITY): Payer: Self-pay

## 2019-11-27 ENCOUNTER — Ambulatory Visit (HOSPITAL_COMMUNITY)
Admission: EM | Admit: 2019-11-27 | Discharge: 2019-11-27 | Disposition: A | Discharge status: Home / Self Care | Payer: Medicaid Other | Attending: Physician Assistant | Admitting: Physician Assistant

## 2019-11-27 ENCOUNTER — Other Ambulatory Visit: Payer: Self-pay

## 2019-11-27 DIAGNOSIS — M654 Radial styloid tenosynovitis [de Quervain]: Secondary | ICD-10-CM | POA: Diagnosis not present

## 2019-11-27 MED ORDER — IBUPROFEN 400 MG PO TABS: 400.0000 mg | ORAL_TABLET | Freq: Four times a day (QID) | ORAL | 0 refills | Status: DC | PRN

## 2019-11-27 NOTE — Discharge Instructions (Signed)
Take 2 regular strength ibuprofen every 6 hours. This is safe during breast feeding Wear the splint at all times and while sleeping  Follow up with  [facility]

## 2019-11-27 NOTE — ED Provider Notes (Signed)
[facility]    CSN: [id number] Arrival date & time: 11/27/19  7858      History   Chief Complaint Chief Complaint  Patient presents with  . Wrist Pain    HPI Jade Brown is a 29 y.o. female.   Patient reports for reevaluation of right wrist pain.  She was seen on 11/06/2019 diagnosed with de Quervain's tenosynovitis.  She has been wearing her thumb spica and took the prednisone as prescribed.  She has had minimal to no improvement in her pain.  She has not taken anything since stopping the prednisone.  She reports she has been using the thumb spica as instructed.  Denies numbness and tingling.  Reports pain is in the same locations as previous with some extension up the arm.     Past Medical History:  Diagnosis Date  . Gallstones     Patient Active Problem List   Diagnosis Date Noted  . Supervision of normal pregnancy 06/14/2019  . Bacterial vaginosis 06/11/2019  . Group beta Strep positive 05/17/2019  . Rh negative status during pregnancy 12/01/2018  . Supervision of other normal pregnancy, antepartum 11/29/2018    Past Surgical History:  Procedure Laterality Date  . CHOLECYSTECTOMY N/A 12/04/2013   Procedure: LAPAROSCOPIC CHOLECYSTECTOMY;  Surgeon: Axel Filler, MD;  Location: [facility];  Service: General;  Laterality: N/A;  . DILATION AND CURETTAGE OF UTERUS    . WISDOM TOOTH EXTRACTION      OB History    Gravida  3   Para  2   Term  2   Preterm      AB  1   Living  2     SAB      TAB  1   Ectopic      Multiple  0   Live Births  2            Home Medications    Prior to Admission medications   Medication Sig Start Date End Date Taking? Authorizing Provider  acetaminophen (TYLENOL) 325 MG tablet Take 2 tablets (650 mg total) by mouth every 4 (four) hours as needed (for pain scale < 4). 06/16/19   Derrel Nip, MD  ibuprofen (ADVIL) 400 MG tablet Take 1 tablet (400 mg total) by mouth every 6 (six) hours as needed. 11/27/19    Elyna Pangilinan, Veryl Speak, PA-C  Prenatal Vit-Fe Fumarate-FA (MULTIVITAMIN-PRENATAL) 27-0.8 MG TABS tablet Take 1 tablet by mouth daily at 12 noon.    [provider]    Family History Family History  Problem Relation Age of Onset  . Hypertension Mother   . Diabetes Mother   . Diabetes Maternal Grandmother     Social History Social History   Tobacco Use  . Smoking status: Former Smoker    Years: 2.00    Types: Cigars    Quit date: 01/12/2011    Years since quitting: 8.8  . Smokeless tobacco: Never Used  Substance Use Topics  . Alcohol use: No  . Drug use: No     Allergies   Flagyl [metronidazole]   Review of Systems Review of Systems   Physical Exam Triage Vital Signs ED Triage Vitals  Enc Vitals Group     BP 11/27/19 0920 95/67     Pulse Rate 11/27/19 0920 76     Resp 11/27/19 0920 18     Temp 11/27/19 0920 98.6 F (37 C)     Temp Source 11/27/19 0920 Oral     SpO2  11/27/19 0920 95 %     Weight 11/27/19 0918 180 lb (81.6 kg)     Height --      Head Circumference --      Peak Flow --      Pain Score 11/27/19 0917 10     Pain Loc --      Pain Edu? --      Excl. in [place]? --    No data found.  Updated Vital Signs BP 95/67 (BP Location: Right Arm)   Pulse 76   Temp 98.6 F (37 C) (Oral)   Resp 18   Wt 180 lb (81.6 kg)   LMP 11/02/2019 (Within Days)   SpO2 95%   Breastfeeding Yes   BMI 32.92 kg/m   Visual Acuity Right Eye Distance:   Left Eye Distance:   Bilateral Distance:    Right Eye Near:   Left Eye Near:    Bilateral Near:     Physical Exam Vitals and nursing note reviewed.  Constitutional:      Appearance: Normal appearance.  Musculoskeletal:        General: Normal range of motion.     Comments: Tenderness over radial aspect of right wrist and thumb.  Finkelstein's positive.  Neurovascularly intact.  Minimal to no tenderness over the volar aspect of wrist.  Neurological:     Mental Status: She is alert.      UC Treatments /  Results  Labs (all labs ordered are listed, but only abnormal results are displayed) Labs Reviewed - No data to display  EKG   Radiology No results found.  Procedures Procedures (including critical care time)  Medications Ordered in UC Medications - No data to display  Initial Impression / Assessment and Plan / UC Course  I have reviewed the triage vital signs and the nursing notes.  Pertinent labs & imaging results that were available during my care of the patient were reviewed by me and considered in my medical decision making (see chart for details).     #De Quervain's tenosynovitis of right wrist Patient is 29 year old female with de Quervain's tenosynovitis of right wrist.  Given she had steroid therapy has been placed in thumb spica already, will recommend ibuprofen therapy at 400 mg dose every 6 hours as this is safe during lactation.  Also instructed to follow-up with sports medicine for further management.  Patient verbalized understanding. -Patient to wear thumb spica at all times to include while sleeping Final Clinical Impressions(s) / UC Diagnoses   Final diagnoses:  Tenosynovitis, de Quervain     Discharge Instructions     Take 2 regular strength ibuprofen every 6 hours. This is safe during breast feeding Wear the splint at all times and while sleeping  Follow up with  the sports medicine group    ED Prescriptions    Medication Sig Dispense Auth. Provider   ibuprofen (ADVIL) 400 MG tablet Take 1 tablet (400 mg total) by mouth every 6 (six) hours as needed. 30 tablet Kamaile Zachow, Marguerita Beards, PA-C     PDMP not reviewed this encounter.   Purnell Shoemaker, PA-C 11/27/19 1042

## 2019-11-27 NOTE — ED Triage Notes (Signed)
Pt states she came on 11/06/2019 for her right wrist pain & states it has come back.

## 2019-12-05 ENCOUNTER — Other Ambulatory Visit: Payer: Self-pay

## 2019-12-05 ENCOUNTER — Encounter: Payer: Self-pay | Admitting: Sports Medicine

## 2019-12-05 ENCOUNTER — Ambulatory Visit: Payer: Medicaid Other | Admitting: Sports Medicine

## 2019-12-05 VITALS — BP 104/68 | Ht 63.0 in | Wt 180.0 lb

## 2019-12-05 DIAGNOSIS — M654 Radial styloid tenosynovitis [de Quervain]: Secondary | ICD-10-CM

## 2019-12-05 MED ORDER — METHYLPREDNISOLONE ACETATE 40 MG/ML IJ SUSP
20.0000 mg | Freq: Once | INTRAMUSCULAR | Status: AC
  Administered 2019-12-05: 20 mg via INTRA_ARTICULAR

## 2019-12-05 NOTE — Addendum Note (Signed)
Addended by: Rutha Bouchard E on: 12/05/2019 04:05 PM   Modules accepted: Orders

## 2019-12-05 NOTE — Progress Notes (Addendum)
PCP: Patient, No Pcp Per  Subjective:   HPI: Patient is a 29 y.o. female here for evaluation of right wrist pain.  Pain is been present for the last several months.  She denies any specific injury or trauma.  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  Pain is located on the radial aspect of wrist and radiates up her arm.  She also notes numbness and tingling affecting all of her fingers.  She was seen at urgent care who diagnosed her with de Quervain's tenosynovitis.  She was given a thumb spica wrist brace and instructed to follow-up with sports medicine.  Patient notes the wrist brace is not really helped her pain at all.  She notes raising her arm above her head will help with some of the radiating pain up her arm however she still getting pain at the wrist.  Of note patient has a 27-month-old child at home and notes picking up her child has been bothering her wrist as well.   Review of Systems: See HPI above.  Past Medical History:  Diagnosis Date  . Gallstones     Current Outpatient Medications on File Prior to Visit  Medication Sig Dispense Refill  . ibuprofen (ADVIL) 400 MG tablet Take 1 tablet (400 mg total) by mouth every 6 (six) hours as needed. 30 tablet 0  . acetaminophen (TYLENOL) 325 MG tablet Take 2 tablets (650 mg total) by mouth every 4 (four) hours as needed (for pain scale < 4).    . Prenatal Vit-Fe Fumarate-FA (MULTIVITAMIN-PRENATAL) 27-0.8 MG TABS tablet Take 1 tablet by mouth daily at 12 noon.     No current facility-administered medications on file prior to visit.    Past Surgical History:  Procedure Laterality Date  . CHOLECYSTECTOMY N/A 12/04/2013   Procedure: LAPAROSCOPIC CHOLECYSTECTOMY;  Surgeon: Axel Filler, MD;  Location: [facility];  Service: General;  Laterality: N/A;  . DILATION AND CURETTAGE OF UTERUS    . WISDOM TOOTH EXTRACTION      Allergies  Allergen Reactions  . Flagyl [Metronidazole] Nausea And Vomiting     Social History   Socioeconomic History  . Marital status: Single    Spouse name: Not on file  . Number of children: Not on file  . Years of education: Not on file  . Highest education level: Not on file  Occupational History  . Not on file  Tobacco Use  . Smoking status: Former Smoker    Years: 2.00    Types: Cigars    Quit date: 01/12/2011    Years since quitting: 8.9  . Smokeless tobacco: Never Used  Substance and Sexual Activity  . Alcohol use: No  . Drug use: No  . Sexual activity: Yes    Partners: Male    Birth control/protection: None  Other Topics Concern  . Not on file  Social History Narrative  . Not on file   Social Determinants of Health   Financial Resource Strain:   . Difficulty of Paying Living Expenses:   Food Insecurity:   . Worried About Programme researcher, broadcasting/film/video in the Last Year:   . Barista in the Last Year:   Transportation Needs:   . Freight forwarder (Medical):   Marland Kitchen Lack of Transportation (Non-Medical):   Physical Activity:   . Days of Exercise per Week:   . Minutes of Exercise per Session:   Stress:   . Feeling of Stress :  Social Connections:   . Frequency of Communication with Friends and Family:   . Frequency of Social Gatherings with Friends and Family:   . Attends Religious Services:   . Active Member of Clubs or Organizations:   . Attends Archivist Meetings:   Marland Kitchen Marital Status:   Intimate Partner Violence:   . Fear of Current or Ex-Partner:   . Emotionally Abused:   Marland Kitchen Physically Abused:   . Sexually Abused:     Family History  Problem Relation Age of Onset  . Hypertension Mother   . Diabetes Mother   . Diabetes Maternal Grandmother         Objective:  Physical Exam: BP 104/68   Ht 5\' 3"  (1.6 m)   Wt 180 lb (81.6 kg)   BMI 31.89 kg/m  Gen: NAD, comfortable in exam room Lungs: Breathing comfortably on room air Hand/wrist Exam Right -Inspection: No deformity, no discoloration -Palpation:  Tenderness palpation over the first dorsal compartment of the wrist. -ROM: Normal range of motion with flexion, extension, radial deviation, ulnar deviation, pronation, supination.  Patient with pain with moving her wrist in all planes -Strength: Flexion: 5/5; Extension: 5/5; Radial Deviation: 5/5; Ulnar Deviation: 5/5.  Patient did have pain in all directions with her wrist. -Special Tests: Tinels: Negative; Phalens: Positive; Finkelstein's: Positive -Limb neurovascularly intact  Patient with normal range of motion at the shoulder.  Bring her arm above her head did alleviate some of the radiating pain up her arm however did not affect the pain in her wrist and hand.  Limited diagnostic ultrasound of the right wrist Findings: -Fluid noted within the first dorsal compartment. -Longitudinal split tear was seen in the extensor pollicis brevis tendon. -Normal appearance of the second dorsal compartment Pression: -Ultrasound findings consistent with longitudinal split tear of the extensor pollicis brevis and fluid within the first dorsal compartment consistent with de Quervain's tenosynovitis with a tear   Assessment & Plan:  Patient is a 29 y.o. female here for evaluation of right wrist pain  1.  Right wrist de Quervain's tenosynovitis with tearing of the extensor pollicis brevis -Consents obtained for corticosteroid injection of the first dorsal compartment.  Timeout was performed prior to the procedure.  20 mg Depo-Medrol and half cc of lidocaine were injected into the first dorsal compartment tendon sheath using ultrasound guidance and sterile technique.  Patient tolerated the injection well there are no complications -Patient will continue wearing the thumb spica brace  Patient will follow up in 4 to 6 weeks  Addendum:  I was the preceptor for this visit and available for immediate consultation.  Karlton Lemon MD Kirt Boys

## 2019-12-05 NOTE — Patient Instructions (Signed)
The pain in your wrist is caused by inflammation of the tendons that move your thumb.  On the ultrasound I saw fluid within the tendons as well as a partial tear of one of the tendons. -The steroid injection given to you today will take several days before it has an effect.  You may use ice on your wrist as needed for the next day or 2 to help with some of the pain -Continue wearing the brace given to you urgent care  I will see back in 4 to 6 weeks

## 2019-12-06 MED ORDER — METHYLPREDNISOLONE ACETATE 40 MG/ML IJ SUSP: 20.0000 mg | Freq: Once | INTRAMUSCULAR | Status: DC

## 2019-12-06 NOTE — Addendum Note (Signed)
Addended by: Annita Brod on: 12/06/2019 08:55 AM   Modules accepted: Orders

## 2019-12-06 NOTE — Addendum Note (Signed)
Addended by: Annita Brod on: 12/06/2019 09:08 AM   Modules accepted: Orders

## 2020-01-04 ENCOUNTER — Inpatient Hospital Stay (HOSPITAL_COMMUNITY): Payer: Medicaid Other

## 2020-01-04 ENCOUNTER — Inpatient Hospital Stay (HOSPITAL_COMMUNITY)
Admission: AD | Admit: 2020-01-04 | Discharge: 2020-01-04 | Disposition: A | Discharge status: Home / Self Care | Payer: Medicaid Other | Source: Ambulatory Visit | Attending: Obstetrics and Gynecology | Admitting: Obstetrics and Gynecology

## 2020-01-04 ENCOUNTER — Other Ambulatory Visit: Payer: Self-pay

## 2020-01-04 ENCOUNTER — Encounter (HOSPITAL_COMMUNITY): Payer: Self-pay | Admitting: Obstetrics and Gynecology

## 2020-01-04 DIAGNOSIS — O219 Vomiting of pregnancy, unspecified: Secondary | ICD-10-CM | POA: Diagnosis present

## 2020-01-04 DIAGNOSIS — O26891 Other specified pregnancy related conditions, first trimester: Secondary | ICD-10-CM | POA: Insufficient documentation

## 2020-01-04 DIAGNOSIS — R102 Pelvic and perineal pain: Secondary | ICD-10-CM | POA: Diagnosis not present

## 2020-01-04 DIAGNOSIS — Z87891 Personal history of nicotine dependence: Secondary | ICD-10-CM | POA: Diagnosis not present

## 2020-01-04 DIAGNOSIS — Z3A08 8 weeks gestation of pregnancy: Secondary | ICD-10-CM

## 2020-01-04 DIAGNOSIS — Z3490 Encounter for supervision of normal pregnancy, unspecified, unspecified trimester: Secondary | ICD-10-CM

## 2020-01-04 LAB — URINALYSIS, MICROSCOPIC (REFLEX): RBC / HPF: NONE SEEN RBC/hpf (ref 0–5)

## 2020-01-04 LAB — COMPREHENSIVE METABOLIC PANEL
ALT: 13 U/L (ref 0–44)
AST: 17 U/L (ref 15–41)
Albumin: 3.9 g/dL (ref 3.5–5.0)
Alkaline Phosphatase: 69 U/L (ref 38–126)
Anion gap: 9 (ref 5–15)
BUN: 6 mg/dL (ref 6–20)
CO2: 23 mmol/L (ref 22–32)
Calcium: 9 mg/dL (ref 8.9–10.3)
Chloride: 105 mmol/L (ref 98–111)
Creatinine, Ser: 0.74 mg/dL (ref 0.44–1.00)
GFR calc Af Amer: >60 mL/min (ref >60)
GFR calc non Af Amer: >60 mL/min (ref >60)
Glucose, Bld: 93 mg/dL (ref 70–99)
Potassium: 3.9 mmol/L (ref 3.5–5.1)
Sodium: 137 mmol/L (ref 135–145)
Total Bilirubin: 0.6 mg/dL (ref 0.3–1.2)
Total Protein: 6.4 g/dL — ABNORMAL LOW (ref 6.5–8.1)

## 2020-01-04 LAB — URINALYSIS, ROUTINE W REFLEX MICROSCOPIC
Bilirubin Urine: NEGATIVE
Glucose, UA: NEGATIVE mg/dL
Hgb urine dipstick: NEGATIVE
Ketones, ur: 15 mg/dL — AB
Leukocytes,Ua: NEGATIVE
Nitrite: NEGATIVE
Protein, ur: 30 mg/dL — AB
Specific Gravity, Urine: 1.025 (ref 1.005–1.030)
pH: 6.5 (ref 5.0–8.0)

## 2020-01-04 LAB — CBC
HCT: 35.6 % — ABNORMAL LOW (ref 36.0–46.0)
Hemoglobin: 11.8 g/dL — ABNORMAL LOW (ref 12.0–15.0)
MCH: 30.5 pg (ref 26.0–34.0)
MCHC: 33.1 g/dL (ref 30.0–36.0)
MCV: 92 fL (ref 80.0–100.0)
Platelets: 330 10*3/uL (ref 150–400)
RBC: 3.87 MIL/uL (ref 3.87–5.11)
RDW: 12.7 % (ref 11.5–15.5)
WBC: 14.8 10*3/uL — ABNORMAL HIGH (ref 4.0–10.5)
nRBC: 0 % (ref 0.0–0.2)

## 2020-01-04 LAB — HCG, QUANTITATIVE, PREGNANCY: hCG, Beta Chain, Quant, S: 207300 m[IU]/mL — ABNORMAL HIGH (ref <5)

## 2020-01-04 LAB — WET PREP, GENITAL
Sperm: NONE SEEN
Trich, Wet Prep: NONE SEEN
Yeast Wet Prep HPF POC: NONE SEEN

## 2020-01-04 LAB — POCT PREGNANCY, URINE: Preg Test, Ur: POSITIVE — AB

## 2020-01-04 MED ORDER — FAMOTIDINE IN NACL 20-0.9 MG/50ML-% IV SOLN
20.0000 mg | Freq: Once | INTRAVENOUS | Status: AC
  Administered 2020-01-04: 20 mg via INTRAVENOUS
  Filled 2020-01-04: qty 50

## 2020-01-04 MED ORDER — PROMETHAZINE HCL 25 MG/ML IJ SOLN
12.5000 mg | Freq: Once | INTRAMUSCULAR | Status: AC
  Administered 2020-01-04: 12.5 mg via INTRAVENOUS
  Filled 2020-01-04: qty 1

## 2020-01-04 MED ORDER — DOXYLAMINE-PYRIDOXINE 10-10 MG PO TBEC: 2.0000 | DELAYED_RELEASE_TABLET | Freq: Every day | ORAL | 1 refills | Status: DC

## 2020-01-04 MED ORDER — LACTATED RINGERS IV BOLUS
1000.0000 mL | Freq: Once | INTRAVENOUS | Status: AC
  Administered 2020-01-04: 1000 mL via INTRAVENOUS

## 2020-01-04 NOTE — Discharge Instructions (Signed)
First Trimester of Pregnancy The first trimester of pregnancy is from week 1 until the end of week 13 (months 1 through 3). A week after a sperm fertilizes an egg, the egg will implant on the wall of the uterus. This embryo will begin to develop into a baby. Genes from you and your partner will form the baby. The female genes will determine whether the baby will be a boy or a girl. At 6-8 weeks, the eyes and face will be formed, and the heartbeat can be seen on ultrasound. At the end of 12 weeks, all the baby's organs will be formed. Now that you are pregnant, you will want to do everything you can to have a healthy baby. Two of the most important things are to get good prenatal care and to follow your health care provider's instructions. Prenatal care is all the medical care you receive before the baby's birth. This care will help prevent, find, and treat any problems during the pregnancy and childbirth. Body changes during your first trimester Your body goes through many changes during pregnancy. The changes vary from woman to woman.  You may gain or lose a couple of pounds at first.  You may feel sick to your stomach (nauseous) and you may throw up (vomit). If the vomiting is uncontrollable, call your health care provider.  You may tire easily.  You may develop headaches that can be relieved by medicines. All medicines should be approved by your health care provider.  You may urinate more often. Painful urination may mean you have a bladder infection.  You may develop heartburn as a result of your pregnancy.  You may develop constipation because certain hormones are causing the muscles that push stool through your intestines to slow down.  You may develop hemorrhoids or swollen veins (varicose veins).  Your breasts may begin to grow larger and become tender. Your nipples may stick out more, and the tissue that surrounds them (areola) may become darker.  Your gums may bleed and may be  sensitive to brushing and flossing.  Dark spots or blotches (chloasma, mask of pregnancy) may develop on your face. This will likely fade after the baby is born.  Your menstrual periods will stop.  You may have a loss of appetite.  You may develop cravings for certain kinds of food.  You may have changes in your emotions from day to day, such as being excited to be pregnant or being concerned that something may go wrong with the pregnancy and baby.  You may have more vivid and strange dreams.  You may have changes in your hair. These can include thickening of your hair, rapid growth, and changes in texture. Some women also have hair loss during or after pregnancy, or hair that feels dry or thin. Your hair will most likely return to normal after your baby is born. What to expect at prenatal visits During a routine prenatal visit:  You will be weighed to make sure you and the baby are growing normally.  Your blood pressure will be taken.  Your abdomen will be measured to track your baby's growth.  The fetal heartbeat will be listened to between weeks 10 and 14 of your pregnancy.  Test results from any previous visits will be discussed. Your health care provider may ask you:  How you are feeling.  If you are feeling the baby move.  If you have had any abnormal symptoms, such as leaking fluid, bleeding, severe headaches, or abdominal   cramping.  If you are using any tobacco products, including cigarettes, chewing tobacco, and electronic cigarettes.  If you have any questions. Other tests that may be performed during your first trimester include:  Blood tests to find your blood type and to check for the presence of any previous infections. The tests will also be used to check for low iron levels (anemia) and protein on red blood cells (Rh antibodies). Depending on your risk factors, or if you previously had diabetes during pregnancy, you may have tests to check for high blood sugar  that affects pregnant women (gestational diabetes).  Urine tests to check for infections, diabetes, or protein in the urine.  An ultrasound to confirm the proper growth and development of the baby.  Fetal screens for spinal cord problems (spina bifida) and Down syndrome.  HIV (human immunodeficiency virus) testing. Routine prenatal testing includes screening for HIV, unless you choose not to have this test.  You may need other tests to make sure you and the baby are doing well. Follow these instructions at home: Medicines  Follow your health care provider's instructions regarding medicine use. Specific medicines may be either safe or unsafe to take during pregnancy.  Take a prenatal vitamin that contains at least 600 micrograms (mcg) of folic acid.  If you develop constipation, try taking a stool softener if your health care provider approves. Eating and drinking   Eat a balanced diet that includes fresh fruits and vegetables, whole grains, good sources of protein such as meat, eggs, or tofu, and low-fat dairy. Your health care provider will help you determine the amount of weight gain that is right for you.  Avoid raw meat and uncooked cheese. These carry germs that can cause birth defects in the baby.  Eating four or five small meals rather than three large meals a day may help relieve nausea and vomiting. If you start to feel nauseous, eating a few soda crackers can be helpful. Drinking liquids between meals, instead of during meals, also seems to help ease nausea and vomiting.  Limit foods that are high in fat and processed sugars, such as fried and sweet foods.  To prevent constipation: ? Eat foods that are high in fiber, such as fresh fruits and vegetables, whole grains, and beans. ? Drink enough fluid to keep your urine clear or pale yellow. Activity  Exercise only as directed by your health care provider. Most women can continue their usual exercise routine during  pregnancy. Try to exercise for 30 minutes at least 5 days a week. Exercising will help you: ? Control your weight. ? Stay in shape. ? Be prepared for labor and delivery.  Experiencing pain or cramping in the lower abdomen or lower back is a good sign that you should stop exercising. Check with your health care provider before continuing with normal exercises.  Try to avoid standing for long periods of time. Move your legs often if you must stand in one place for a long time.  Avoid heavy lifting.  Wear low-heeled shoes and practice good posture.  You may continue to have sex unless your health care provider tells you not to. Relieving pain and discomfort  Wear a good support bra to relieve breast tenderness.  Take warm sitz baths to soothe any pain or discomfort caused by hemorrhoids. Use hemorrhoid cream if your health care provider approves.  Rest with your legs elevated if you have leg cramps or low back pain.  If you develop varicose veins in   your legs, wear support hose. Elevate your feet for 15 minutes, 3-4 times a day. Limit salt in your diet. Prenatal care  Schedule your prenatal visits by the twelfth week of pregnancy. They are usually scheduled monthly at first, then more often in the last 2 months before delivery.  Write down your questions. Take them to your prenatal visits.  Keep all your prenatal visits as told by your health care provider. This is important. Safety  Wear your seat belt at all times when driving.  Make a list of emergency phone numbers, including numbers for family, friends, the hospital, and police and fire departments. General instructions  Ask your health care provider for a referral to a local prenatal education class. Begin classes no later than the beginning of month 6 of your pregnancy.  Ask for help if you have counseling or nutritional needs during pregnancy. Your health care provider can offer advice or refer you to specialists for help  with various needs.  Do not use hot tubs, steam rooms, or saunas.  Do not douche or use tampons or scented sanitary pads.  Do not cross your legs for long periods of time.  Avoid cat litter boxes and soil used by cats. These carry germs that can cause birth defects in the baby and possibly loss of the fetus by miscarriage or stillbirth.  Avoid all smoking, herbs, alcohol, and medicines not prescribed by your health care provider. Chemicals in these products affect the formation and growth of the baby.  Do not use any products that contain nicotine or tobacco, such as cigarettes and e-cigarettes. If you need help quitting, ask your health care provider. You may receive counseling support and other resources to help you quit.  Schedule a dentist appointment. At home, brush your teeth with a soft toothbrush and be gentle when you floss. Contact a health care provider if:  You have dizziness.  You have mild pelvic cramps, pelvic pressure, or nagging pain in the abdominal area.  You have persistent nausea, vomiting, or diarrhea.  You have a bad smelling vaginal discharge.  You have pain when you urinate.  You notice increased swelling in your face, hands, legs, or ankles.  You are exposed to fifth disease or chickenpox.  You are exposed to [place] measles (rubella) and have never had it. Get help right away if:  You have a fever.  You are leaking fluid from your vagina.  You have spotting or bleeding from your vagina.  You have severe abdominal cramping or pain.  You have rapid weight gain or loss.  You vomit blood or material that looks like coffee grounds.  You develop a severe headache.  You have shortness of breath.  You have any kind of trauma, such as from a fall or a car accident. Summary  The first trimester of pregnancy is from week 1 until the end of week 13 (months 1 through 3).  Your body goes through many changes during pregnancy. The changes vary from  woman to woman.  You will have routine prenatal visits. During those visits, your health care provider will examine you, discuss any test results you may have, and talk with you about how you are feeling. This information is not intended to replace advice given to you by your health care provider. Make sure you discuss any questions you have with your health care provider. Document Revised: 07/08/2017 Document Reviewed: 07/07/2016 Elsevier Patient Education  2020 [employer].  Abdominal Pain During Pregnancy  Abdominal pain is common during pregnancy, and has many possible causes. Some causes are more serious than others, and sometimes the cause is not known. Abdominal pain can be a sign that labor is starting. It can also be caused by normal growth and stretching of muscles and ligaments during pregnancy. Always tell your health care provider if you have any abdominal pain. Follow these instructions at home:  Do not have sex or put anything in your vagina until your pain goes away completely.  Get plenty of rest until your pain improves.  Drink enough fluid to keep your urine pale yellow.  Take over-the-counter and prescription medicines only as told by your health care provider.  Keep all follow-up visits as told by your health care provider. This is important. Contact a health care provider if:  Your pain continues or gets worse after resting.  You have lower abdominal pain that: ? Comes and goes at regular intervals. ? Spreads to your back. ? Is similar to menstrual cramps.  You have pain or burning when you urinate. Get help right away if:  You have a fever or chills.  You have vaginal bleeding.  You are leaking fluid from your vagina.  You are passing tissue from your vagina.  You have vomiting or diarrhea that lasts for more than 24 hours.  Your baby is moving less than usual.  You feel very weak or faint.  You have shortness of breath.  You  develop severe pain in your upper abdomen. Summary  Abdominal pain is common during pregnancy, and has many possible causes.  If you experience abdominal pain during pregnancy, tell your health care provider right away.  Follow your health care provider's home care instructions and keep all follow-up visits as directed. This information is not intended to replace advice given to you by your health care provider. Make sure you discuss any questions you have with your health care provider. Document Revised: 11/13/2018 Document Reviewed: 10/28/2016 Elsevier Patient Education  2020 [employer] Inc[employer]                        Safe Medications in Pregnancy    Acne: Benzoyl Peroxide Salicylic Acid  Backache/Headache: Tylenol: 2 regular strength every 4 hours OR              2 Extra strength every 6 hours  Colds/Coughs/Allergies: Benadryl (alcohol free) 25 mg every 6 hours as needed Breath right strips Claritin Cepacol throat lozenges Chloraseptic throat spray Cold-Eeze- up to three times per day Cough drops, alcohol free Flonase (by prescription only) Guaifenesin Mucinex Robitussin DM (plain only, alcohol free) Saline nasal spray/drops Sudafed (pseudoephedrine) & Actifed ** use only after [redacted] weeks gestation and if you do not have high blood pressure Tylenol Vicks Vaporub Zinc lozenges Zyrtec   Constipation: Colace Ducolax suppositories Fleet enema Glycerin suppositories Metamucil Milk of magnesia Miralax Senokot Smooth move tea  Diarrhea: Kaopectate Imodium A-D  *NO pepto Bismol  Hemorrhoids: Anusol Anusol HC Preparation H Tucks  Indigestion: Tums Maalox Mylanta Zantac  Pepcid  Insomnia: Benadryl (alcohol free) 25mg  every 6 hours as needed Tylenol PM Unisom, no Gelcaps  Leg Cramps: Tums MagGel  Nausea/Vomiting:  Bonine Dramamine Emetrol Ginger extract Sea bands Meclizine  Nausea medication to take during pregnancy:  Unisom (doxylamine  succinate 25 mg tablets) Take one tablet daily at bedtime. If symptoms are not adequately controlled, the dose can be increased to a maximum recommended  dose of two tablets daily (1/2 tablet in the morning, 1/2 tablet mid-afternoon and one at bedtime). Vitamin B6 100mg  tablets. Take one tablet twice a day (up to 200 mg per day).  Skin Rashes: Aveeno products Benadryl cream or 25mg  every 6 hours as needed Calamine Lotion 1% cortisone cream  Yeast infection: Gyne-lotrimin 7 Monistat 7   **If taking multiple medications, please check labels to avoid duplicating the same active ingredients **take medication as directed on the label ** Do not exceed 4000 mg of tylenol in 24 hours **Do not take medications that contain aspirin or ibuprofen           Morning Sickness  Morning sickness is when a woman feels nauseous during pregnancy. This nauseous feeling may or may not come with vomiting. It often occurs in the morning, but it can be a problem at any time of day. Morning sickness is most common during the first trimester. In some cases, it may continue throughout pregnancy. Although morning sickness is unpleasant, it is usually harmless unless the woman develops severe and continual vomiting (hyperemesis gravidarum), a condition that requires more intense treatment. What are the causes? The exact cause of this condition is not known, but it seems to be related to normal hormonal changes that occur in pregnancy. What increases the risk? You are more likely to develop this condition if:  You experienced nausea or vomiting before your pregnancy.  You had morning sickness during a previous pregnancy.  You are pregnant with more than one baby, such as twins. What are the signs or symptoms? Symptoms of this condition include:  Nausea.  Vomiting. How is this diagnosed? This condition is usually diagnosed based on your signs and symptoms. How is this treated? In many cases,  treatment is not needed for this condition. Making some changes to what you eat may help to control symptoms. Your health care provider may also prescribe or recommend:  Vitamin B6 supplements.  Anti-nausea medicines.  Ginger. Follow these instructions at home: Medicines  Take over-the-counter and prescription medicines only as told by your health care provider. Do not use any prescription, over-the-counter, or herbal medicines for morning sickness without first talking with your health care provider.  Taking multivitamins before getting pregnant can prevent or decrease the severity of morning sickness in most women. Eating and drinking  Eat a piece of dry toast or crackers before getting out of bed in the morning.  Eat 5 or 6 small meals a day.  Eat dry and bland foods, such as rice or a baked potato. Foods that are high in carbohydrates are often helpful.  Avoid greasy, fatty, and spicy foods.  Have someone cook for you if the smell of any food causes nausea and vomiting.  If you feel nauseous after taking prenatal vitamins, take the vitamins at night or with a snack.  Snack on protein foods between meals if you are hungry. Nuts, yogurt, and cheese are good options.  Drink fluids throughout the day.  Try ginger ale made with real ginger, ginger tea made from fresh grated ginger, or ginger candies. General instructions  Do not use any products that contain nicotine or tobacco, such as cigarettes and e-cigarettes. If you need help quitting, ask your health care provider.  Get an air purifier to keep the air in your house free of odors.  Get plenty of fresh air.  Try to avoid odors that trigger your nausea.  Consider trying these methods to help relieve symptoms: ?  Wearing an acupressure wristband. These wristbands are often worn for seasickness. ? Acupuncture. Contact a health care provider if:  Your home remedies are not working and you need medicine.  You feel dizzy  or light-headed.  You are losing weight. Get help right away if:  You have persistent and uncontrolled nausea and vomiting.  You faint.  You have severe pain in your abdomen. Summary  Morning sickness is when a woman feels nauseous during pregnancy. This nauseous feeling may or may not come with vomiting.  Morning sickness is most common during the first trimester.  It often occurs in the morning, but it can be a problem at any time of day.  In many cases, treatment is not needed for this condition. Making some changes to what you eat may help to control symptoms. This information is not intended to replace advice given to you by your health care provider. Make sure you discuss any questions you have with your health care provider. Document Revised: 07/08/2017 Document Reviewed: 08/28/2016 Elsevier Patient Education  2020 Elsevier Inc.        Hyperemesis Gravidarum Hyperemesis gravidarum is a severe form of nausea and vomiting that happens during pregnancy. Hyperemesis is worse than morning sickness. It may cause you to have nausea or vomiting all day for many days. It may keep you from eating and drinking enough food and liquids, which can lead to dehydration, malnutrition, and weight loss. Hyperemesis usually occurs during the first half (the first 20 weeks) of pregnancy. It often goes away once a woman is in her second half of pregnancy. However, sometimes hyperemesis continues through an entire pregnancy. What are the causes? The cause of this condition is not known. It may be related to changes in chemicals (hormones) in the body during pregnancy, such as the high level of pregnancy hormone (human chorionic gonadotropin) or the increase in the female sex hormone (estrogen). What are the signs or symptoms? Symptoms of this condition include:  Nausea that does not go away.  Vomiting that does not allow you to keep any food down.  Weight loss.  Body fluid loss  (dehydration).  Having no desire to eat, or not liking food that you have previously enjoyed. How is this diagnosed? This condition may be diagnosed based on:  A physical exam.  Your medical history.  Your symptoms.  Blood tests.  Urine tests. How is this treated? This condition is managed by controlling symptoms. This may include:  Following an eating plan. This can help lessen nausea and vomiting.  Taking prescription medicines. An eating plan and medicines are often used together to help control symptoms. If medicines do not help relieve nausea and vomiting, you may need to receive fluids through an IV at the hospital. Follow these instructions at home: Eating and drinking   Avoid the following: ? Drinking fluids with meals. Try not to drink anything during the 30 minutes before and after your meals. ? Drinking more than 1 cup of fluid at a time. ? Eating foods that trigger your symptoms. These may include spicy foods, coffee, high-fat foods, very sweet foods, and acidic foods. ? Skipping meals. Nausea can be more intense on an empty stomach. If you cannot tolerate food, do not force it. Try sucking on ice chips or other frozen items and make up for missed calories later. ? Lying down within 2 hours after eating. ? Being exposed to environmental triggers. These may include food smells, smoky rooms, closed spaces, rooms with strong  smells, warm or humid places, overly loud and noisy rooms, and rooms with motion or flickering lights. Try eating meals in a well-ventilated area that is free of strong smells. ? Quick and sudden changes in your movement. ? Taking iron pills and multivitamins that contain iron. If you take prescription iron pills, do not stop taking them unless your health care provider approves. ? Preparing food. The smell of food can spoil your appetite or trigger nausea.  To help relieve your symptoms: ? Listen to your body. Everyone is different and has  different preferences. Find what works best for you. ? Eat and drink slowly. ? Eat 5-6 small meals daily instead of 3 large meals. Eating small meals and snacks can help you avoid an empty stomach. ? In the morning, before getting out of bed, eat a couple of crackers to avoid moving around on an empty stomach. ? Try eating starchy foods as these are usually tolerated well. Examples include cereal, toast, bread, potatoes, pasta, rice, and pretzels. ? Include at least 1 serving of protein with your meals and snacks. Protein options include lean meats, poultry, seafood, beans, nuts, nut butters, eggs, cheese, and yogurt. ? Try eating a protein-rich snack before bed. Examples of a protein-rick snack include cheese and crackers or a peanut butter sandwich made with 1 slice of whole-wheat bread and 1 tsp (5 g) of peanut butter. ? Eat or suck on things that have ginger in them. It may help relieve nausea. Add  tsp ground ginger to hot tea or choose ginger tea. ? Try drinking 100% fruit juice or an electrolyte drink. An electrolyte drink contains sodium, potassium, and chloride. ? Drink fluids that are cold, clear, and carbonated or sour. Examples include lemonade, ginger ale, lemon-lime soda, ice water, and sparkling water. ? Brush your teeth or use a mouth rinse after meals. ? Talk with your health care provider about starting a supplement of vitamin B6. General instructions  Take over-the-counter and prescription medicines only as told by your health care provider.  Follow instructions from your health care provider about eating or drinking restrictions.  Continue to take your prenatal vitamins as told by your health care provider. If you are having trouble taking your prenatal vitamins, talk with your health care provider about different options.  Keep all follow-up and pre-birth (prenatal) visits as told by your health care provider. This is important. Contact a health care provider if:  You  have pain in your abdomen.  You have a severe headache.  You have vision problems.  You are losing weight.  You feel weak or dizzy. Get help right away if:  You cannot drink fluids without vomiting.  You vomit blood.  You have constant nausea and vomiting.  You are very weak.  You faint.  You have a fever and your symptoms suddenly get worse. Summary  Hyperemesis gravidarum is a severe form of nausea and vomiting that happens during pregnancy.  Making some changes to your eating habits may help relieve nausea and vomiting.  This condition may be managed with medicine.  If medicines do not help relieve nausea and vomiting, you may need to receive fluids through an IV at the hospital. This information is not intended to replace advice given to you by your health care provider. Make sure you discuss any questions you have with your health care provider. Document Revised: 08/15/2017 Document Reviewed: 03/24/2016 Elsevier Patient Education  2020 [employer].

## 2020-01-04 NOTE — MAU Provider Note (Signed)
History     CSN: [id number]  Arrival date and time: 01/04/20 3154   First Provider Initiated Contact with Patient 01/04/20 1049      Chief Complaint  Patient presents with  . Emesis  . Possible Pregnancy  . Fatigue   [name] is a 29 y.o. [id number] at [redacted]w[redacted]d who presents to MAU for N/V and pelvic cramping.  Onset: 3-4 days ago Location: stomach/pelvis Duration: 3-4 days Character: almost constant nausea, vomiting x10 in past 24 hours, cramping is menstrual-like cramps, pt reports she has not been able to eat or drink in the past 24 hours Aggravating/Associated: none/none Relieving: chewing gum Treatment: none Severity: 5/10 cramping  Pt denies VB, vaginal discharge/odor/itching. Pt denies abdominal pain, constipation, diarrhea, or urinary problems. Pt denies fever, chills, fatigue, sweating or changes in appetite. Pt denies SOB or chest pain. Pt denies dizziness, HA, light-headedness, weakness.  Problems this pregnancy include: pt has not yet been seen. Allergies? Flagyl Current medications/supplements? none Prenatal care provider? [facility], next week   OB History    Gravida  4   Para  2   Term  2   Preterm      AB  1   Living  2     SAB      TAB  1   Ectopic      Multiple  0   Live Births  2           Past Medical History:  Diagnosis Date  . Gallstones     Past Surgical History:  Procedure Laterality Date  . CHOLECYSTECTOMY N/A 12/04/2013   Procedure: LAPAROSCOPIC CHOLECYSTECTOMY;  Surgeon: Axel Filler, MD;  Location: [facility];  Service: General;  Laterality: N/A;  . DILATION AND CURETTAGE OF UTERUS    . WISDOM TOOTH EXTRACTION      Family History  Problem Relation Age of Onset  . Hypertension Mother   . Diabetes Mother   . Diabetes Maternal Grandmother     Social History   Tobacco Use  . Smoking status: Former Smoker    Years: 2.00    Types: Cigars    Quit date: 01/12/2011    Years since quitting: 8.9  . Smokeless  tobacco: Never Used  Substance Use Topics  . Alcohol use: No  . Drug use: No    Allergies:  Allergies  Allergen Reactions  . Flagyl [Metronidazole] Nausea And Vomiting    Facility-Administered Medications Prior to Admission  Medication Dose Route Frequency Provider Last Rate Last Admin  . methylPREDNISolone acetate (DEPO-MEDROL) injection 20 mg  20 mg Intra-articular Once Christena Deem, MD      . methylPREDNISolone acetate (DEPO-MEDROL) injection 20 mg  20 mg Intra-articular Once Christena Deem, MD       Medications Prior to Admission  Medication Sig Dispense Refill Last Dose  . acetaminophen (TYLENOL) 325 MG tablet Take 2 tablets (650 mg total) by mouth every 4 (four) hours as needed (for pain scale < 4).   Unknown at Unknown time  . ibuprofen (ADVIL) 400 MG tablet Take 1 tablet (400 mg total) by mouth every 6 (six) hours as needed. 30 tablet 0 Unknown at Unknown time  . Prenatal Vit-Fe Fumarate-FA (MULTIVITAMIN-PRENATAL) 27-0.8 MG TABS tablet Take 1 tablet by mouth daily at 12 noon.   Unknown at Unknown time    Review of Systems  Constitutional: Negative for chills, diaphoresis, fatigue and fever.  Eyes: Negative for visual disturbance.  Respiratory: Negative for shortness of breath.  Cardiovascular: Negative for chest pain.  Gastrointestinal: Positive for nausea and vomiting. Negative for abdominal pain, constipation and diarrhea.  Genitourinary: Positive for pelvic pain. Negative for dysuria, flank pain, frequency, urgency, vaginal bleeding and vaginal discharge.  Neurological: Negative for dizziness, weakness, light-headedness and headaches.   Physical Exam   Blood pressure (!) 98/49, pulse 83, temperature 98.8 F (37.1 C), temperature source Oral, resp. rate 16, height 5\' 3"  (1.6 m), weight 80.9 kg, last menstrual period 11/02/2019, SpO2 100 %, currently breastfeeding.  Patient Vitals for the past 24 hrs:  BP Temp Temp src Pulse Resp SpO2 Height Weight  01/04/20 1350  (!) 98/49 98.8 F (37.1 C) Oral 83 16 -- -- --  01/04/20 1230 -- -- -- -- -- 100 % -- --  01/04/20 1220 (!) 89/48 -- -- 60 16 100 % -- --  01/04/20 1017 112/68 -- -- 72 16 -- -- --  01/04/20 0957 120/67 98.6 F (37 C) Oral 77 18 99 % 5\' 3"  (1.6 m) 80.9 kg   Physical Exam  Constitutional: She is oriented to person, place, and time. She appears well-developed and well-nourished. No distress.  HENT:  Head: Normocephalic and atraumatic.  Respiratory: Effort normal.  GI: Soft. She exhibits no distension and no mass. There is no abdominal tenderness. There is no rebound and no guarding.  Neurological: She is alert and oriented to person, place, and time.  Skin: Skin is warm and dry. She is not diaphoretic.  Psychiatric: She has a normal mood and affect. Her behavior is normal. Judgment and thought content normal.   Results for orders placed or performed during the hospital encounter of 01/04/20 (from the past 24 hour(s))  Urinalysis, Routine w reflex microscopic     Status: Abnormal   Collection Time: 01/04/20  9:40 AM  Result Value Ref Range   Color, Urine AMBER (A) YELLOW   APPearance HAZY (A) CLEAR   Specific Gravity, Urine 1.025 1.005 - 1.030   pH 6.5 5.0 - 8.0   Glucose, UA NEGATIVE NEGATIVE mg/dL   Hgb urine dipstick NEGATIVE NEGATIVE   Bilirubin Urine NEGATIVE NEGATIVE   Ketones, ur 15 (A) NEGATIVE mg/dL   Protein, ur 30 (A) NEGATIVE mg/dL   Nitrite NEGATIVE NEGATIVE   Leukocytes,Ua NEGATIVE NEGATIVE  Urinalysis, Microscopic (reflex)     Status: Abnormal   Collection Time: 01/04/20  9:40 AM  Result Value Ref Range   RBC / HPF NONE SEEN 0 - 5 RBC/hpf   WBC, UA 0-5 0 - 5 WBC/hpf   Bacteria, UA FEW (A) NONE SEEN   Squamous Epithelial / LPF 6-10 0 - 5   Urine-Other LESS THAN 10 mL OF URINE SUBMITTED   Pregnancy, urine POC     Status: Abnormal   Collection Time: 01/04/20  9:41 AM  Result Value Ref Range   Preg Test, Ur POSITIVE (A) NEGATIVE  Wet prep, genital     Status:  Abnormal   Collection Time: 01/04/20 11:05 AM   Specimen: Vaginal  Result Value Ref Range   Yeast Wet Prep HPF POC NONE SEEN NONE SEEN   Trich, Wet Prep NONE SEEN NONE SEEN   Clue Cells Wet Prep HPF POC PRESENT (A) NONE SEEN   WBC, Wet Prep HPF POC FEW (A) NONE SEEN   Sperm NONE SEEN   CBC     Status: Abnormal   Collection Time: 01/04/20 11:18 AM  Result Value Ref Range   WBC 14.8 (H) 4.0 - 10.5 K/uL   RBC  3.87 3.87 - 5.11 MIL/uL   Hemoglobin 11.8 (L) 12.0 - 15.0 g/dL   HCT 35.6 (L) 36.0 - 46.0 %   MCV 92.0 80.0 - 100.0 fL   MCH 30.5 26.0 - 34.0 pg   MCHC 33.1 30.0 - 36.0 g/dL   RDW 12.7 11.5 - 15.5 %   Platelets 330 150 - 400 K/uL   nRBC 0.0 0.0 - 0.2 %  Comprehensive metabolic panel     Status: Abnormal   Collection Time: 01/04/20 11:18 AM  Result Value Ref Range   Sodium 137 135 - 145 mmol/L   Potassium 3.9 3.5 - 5.1 mmol/L   Chloride 105 98 - 111 mmol/L   CO2 23 22 - 32 mmol/L   Glucose, Bld 93 70 - 99 mg/dL   BUN 6 6 - 20 mg/dL   Creatinine, Ser 0.74 0.44 - 1.00 mg/dL   Calcium 9.0 8.9 - 10.3 mg/dL   Total Protein 6.4 (L) 6.5 - 8.1 g/dL   Albumin 3.9 3.5 - 5.0 g/dL   AST 17 15 - 41 U/L   ALT 13 0 - 44 U/L   Alkaline Phosphatase 69 38 - 126 U/L   Total Bilirubin 0.6 0.3 - 1.2 mg/dL   GFR calc non Af Amer >60 >60 mL/min   GFR calc Af Amer >60 >60 mL/min   Anion gap 9 5 - 15  hCG, quantitative, pregnancy     Status: Abnormal   Collection Time: 01/04/20 11:18 AM  Result Value Ref Range   hCG, Beta Chain, Quant, S 207,300 (H) <5 mIU/mL   US OB LESS THAN 14 WEEKS WITH OB TRANSVAGINAL  Result Date: 01/04/2020 CLINICAL DATA:  Pelvic cramping. Vomiting. Nine weeks 0 days pregnant by last menstrual period. Quantitative beta HCG 207,300. EXAM: OBSTETRIC <14 WK [place] AND TRANSVAGINAL OB US TECHNIQUE: Both transabdominal and transvaginal ultrasound examinations were performed for complete evaluation of the gestation as well as the maternal uterus, adnexal regions, and pelvic  cul-de-sac. Transvaginal technique was performed to assess early pregnancy. COMPARISON:  03/01/2019 FINDINGS: Intrauterine gestational sac: Visualized Yolk sac:  Visualized Embryo:  Visualized Cardiac Activity: Visualized Heart Rate: 174 bpm CRL:  18.6 mm   8 w   2 d                  [place] EDC: 08/13/2020 Subchorionic hemorrhage:  None visualized. Maternal uterus/adnexae: Normal appearing maternal ovaries. No free peritoneal fluid. IMPRESSION: Single live intrauterine gestation with an estimated gestational age of [redacted] weeks and 2 days. No complicating features. Electronically Signed   By: Claudie Revering M.D.   On: 01/04/2020 13:21    MAU Course  Procedures  MDM -r/o ectopic -[place]: single IUP, FHR 174, [redacted]w[redacted]d -hCG: 207, 300 -ABO: A NEGATIVE (pt confirms no VB) -WetPrep: +ClueCells (isolated finding not requiring treatment) -GC/CT collected  -N/V in pregnancy -weight today 80.9, 0.7kg weight loss since 12/05/2019 -UA: amber/hazy/15ketones/30PRO/few bacteria, sending urine for culture -CBC w/Diff: WNL for pregnancy, WBCs 14.8 -CMP: WNL -1L LR + 20mg  Pepcid + 12.5mg  Phenergan given, pt reports NV now resolved -pt able to urinate after fluids -PO challenge successful -pt discharged to home in stable condition  Orders Placed This Encounter  Procedures  . Wet prep, genital    Standing Status:   Standing    Number of Occurrences:   1  . US OB LESS THAN 14 WEEKS WITH OB TRANSVAGINAL    Standing Status:   Standing    Number of Occurrences:  1    Order Specific Question:   Symptom/Reason for Exam    Answer:   Pelvic cramping in antepartum period [867672]  . Urinalysis, Routine w reflex microscopic    Standing Status:   Standing    Number of Occurrences:   1  . CBC    Standing Status:   Standing    Number of Occurrences:   1  . Comprehensive metabolic panel    Standing Status:   Standing    Number of Occurrences:   1  . hCG, quantitative, pregnancy    Standing Status:   Standing    Number of  Occurrences:   1  . Urinalysis, Microscopic (reflex)    Standing Status:   Standing    Number of Occurrences:   1  . Pregnancy, urine POC    Standing Status:   Standing    Number of Occurrences:   1  . Insert peripheral IV    Standing Status:   Standing    Number of Occurrences:   1  . Discharge patient    Order Specific Question:   Discharge disposition    Answer:   01-Home or Self Care [1]    Order Specific Question:   Discharge patient date    Answer:   01/04/2020   Meds ordered this encounter  Medications  . lactated ringers bolus 1,000 mL  . famotidine (PEPCID) IVPB 20 mg premix  . promethazine (PHENERGAN) injection 12.5 mg  . Doxylamine-Pyridoxine (DICLEGIS) 10-10 MG TBEC    Sig: Take 2 tablets by mouth at bedtime. Two tablets at bedtime on day 1 and 2; if symptoms persist, take 1 tablet in morning and 2 tablets at bedtime on day 3; if symptoms persist, may increase to 1 tablet in morning, 1 tablet mid-afternoon, and 2 tablets at bedtime on day 4 (maximum: doxylamine 40 mg/pyridoxine 40 mg (4 tablets) per day).    Dispense:  60 tablet    Refill:  1    Order Specific Question:   Supervising Provider    Answer:   Conan Bowens [[id number]]   Assessment and Plan   1. Nausea and vomiting in pregnancy   2. Pelvic cramping in antepartum period   3. Intrauterine pregnancy   4. [redacted] weeks gestation of pregnancy    Allergies as of 01/04/2020      Reactions   Flagyl [metronidazole] Nausea And Vomiting      Medication List    STOP taking these medications   ibuprofen 400 MG tablet Commonly known as: ADVIL     TAKE these medications   acetaminophen 325 MG tablet Commonly known as: Tylenol Take 2 tablets (650 mg total) by mouth every 4 (four) hours as needed (for pain scale < 4).   Doxylamine-Pyridoxine 10-10 MG Tbec Commonly known as: Diclegis Take 2 tablets by mouth at bedtime. Two tablets at bedtime on day 1 and 2; if symptoms persist, take 1 tablet in morning and 2 tablets  at bedtime on day 3; if symptoms persist, may increase to 1 tablet in morning, 1 tablet mid-afternoon, and 2 tablets at bedtime on day 4 (maximum: doxylamine 40 mg/pyridoxine 40 mg (4 tablets) per day).   multivitamin-prenatal 27-0.8 MG Tabs tablet Take 1 tablet by mouth daily at 12 noon.      -will call with culture results, if positive -start OB care -pt denies smoking marijuana -safe meds in pregnancy list given, showed information for OTC Diclegis if insurance will not cover RX -RX  Diclegis (pt declines RX for phenergan d/t sedating side effects) -pt advised to take medications around the clock and not to stop taking if feeling better -discussed nonpharmacologic and pharmacologic treatments of N/V -discussed normal expectations for N/V in pregnancy -pt discharged to home in stable condition  Joni Reining E Krisa Blattner 01/04/2020, 2:11 PM

## 2020-01-04 NOTE — MAU Note (Signed)
Thinks she might be preg.  Neg test last week and yesterday, ours is positive, period is late. Been sick, can't eat.  Feels so weak. Been vomiting a lot, happened before with other pregnancies. Cramping in bottom of stomach.

## 2020-01-08 LAB — GC/CHLAMYDIA PROBE AMP (~~LOC~~) NOT AT ARMC
Chlamydia: NEGATIVE
Comment: NEGATIVE
Comment: NORMAL
Neisseria Gonorrhea: NEGATIVE

## 2020-01-09 ENCOUNTER — Ambulatory Visit (INDEPENDENT_AMBULATORY_CARE_PROVIDER_SITE_OTHER): Payer: Medicaid Other | Admitting: Sports Medicine

## 2020-01-09 ENCOUNTER — Other Ambulatory Visit: Payer: Self-pay

## 2020-01-09 ENCOUNTER — Encounter: Payer: Self-pay | Admitting: Sports Medicine

## 2020-01-09 VITALS — BP 110/72 | Ht 63.0 in | Wt 178.0 lb

## 2020-01-09 DIAGNOSIS — M25531 Pain in right wrist: Secondary | ICD-10-CM | POA: Diagnosis not present

## 2020-01-09 DIAGNOSIS — M654 Radial styloid tenosynovitis [de Quervain]: Secondary | ICD-10-CM

## 2020-01-09 NOTE — Patient Instructions (Signed)
The pain in your wrist is caused by a split tear in one of the tendons by your thumb. -We will send you to hand therapy to work on strength and motion and rehab your thumb -You may take Tylenol as needed for pain.  You should avoid NSAIDs such as ibuprofen or Aleve -We will hold off on using nitroglycerin patches for now -You may continue to use the brace intermittently as needed  I will see you back in 4 weeks to evaluate your progress

## 2020-01-09 NOTE — Progress Notes (Signed)
PCP: Patient, No Pcp Per  Subjective:   HPI: Patient is a 29 y.o. female here for follow-up of right wrist pain.  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  Patient was given a corticosteroid injection into the tendon sheath of the first dorsal compartment.  She was also wearing a thumb spica brace intermittently.  Patient notes roughly 50% improvement in her symptoms since her last visit.  She is no longer wearing the thumb spica brace full-time but will only use it occasionally now.  Patient is able to work with minimal difficulty at this time.  Patient denies any numbness or tingling in her hands.  She denies any bruising or swelling.  Patient does note some weakness and stiffness of her thumb since her last visit.   Review of Systems: See HPI above.  Past Medical History:  Diagnosis Date  . Gallstones     Current Outpatient Medications on File Prior to Visit  Medication Sig Dispense Refill  . acetaminophen (TYLENOL) 325 MG tablet Take 2 tablets (650 mg total) by mouth every 4 (four) hours as needed (for pain scale < 4).    . Doxylamine-Pyridoxine (DICLEGIS) 10-10 MG TBEC Take 2 tablets by mouth at bedtime. Two tablets at bedtime on day 1 and 2; if symptoms persist, take 1 tablet in morning and 2 tablets at bedtime on day 3; if symptoms persist, may increase to 1 tablet in morning, 1 tablet mid-afternoon, and 2 tablets at bedtime on day 4 (maximum: doxylamine 40 mg/pyridoxine 40 mg (4 tablets) per day). 60 tablet 1  . Prenatal Vit-Fe Fumarate-FA (MULTIVITAMIN-PRENATAL) 27-0.8 MG TABS tablet Take 1 tablet by mouth daily at 12 noon.     Current Facility-Administered Medications on File Prior to Visit  Medication Dose Route Frequency Provider Last Rate Last Admin  . methylPREDNISolone acetate (DEPO-MEDROL) injection 20 mg  20 mg Intra-articular Once Inez Catalina, MD      . methylPREDNISolone acetate (DEPO-MEDROL) injection 20 mg  20 mg  Intra-articular Once Inez Catalina, MD        Past Surgical History:  Procedure Laterality Date  . CHOLECYSTECTOMY N/A 12/04/2013   Procedure: LAPAROSCOPIC CHOLECYSTECTOMY;  Surgeon: Ralene Ok, MD;  Location: [facility];  Service: General;  Laterality: N/A;  . DILATION AND CURETTAGE OF UTERUS    . WISDOM TOOTH EXTRACTION      Allergies  Allergen Reactions  . Flagyl [Metronidazole] Nausea And Vomiting    Social History   Socioeconomic History  . Marital status: Single    Spouse name: Not on file  . Number of children: Not on file  . Years of education: Not on file  . Highest education level: Not on file  Occupational History  . Not on file  Tobacco Use  . Smoking status: Former Smoker    Years: 2.00    Types: Cigars    Quit date: 01/12/2011    Years since quitting: 8.9  . Smokeless tobacco: Never Used  Substance and Sexual Activity  . Alcohol use: No  . Drug use: No  . Sexual activity: Yes    Partners: Male    Birth control/protection: None  Other Topics Concern  . Not on file  Social History Narrative  . Not on file   Social Determinants of Health   Financial Resource Strain:   . Difficulty of Paying Living Expenses:   Food Insecurity:   . Worried About Charity fundraiser in the Last Year:   .  Ran Out of Food in the Last Year:   Transportation Needs:   . Freight forwarder (Medical):   Marland Kitchen Lack of Transportation (Non-Medical):   Physical Activity:   . Days of Exercise per Week:   . Minutes of Exercise per Session:   Stress:   . Feeling of Stress :   Social Connections:   . Frequency of Communication with Friends and Family:   . Frequency of Social Gatherings with Friends and Family:   . Attends Religious Services:   . Active Member of Clubs or Organizations:   . Attends Banker Meetings:   Marland Kitchen Marital Status:   Intimate Partner Violence:   . Fear of Current or Ex-Partner:   . Emotionally Abused:   Marland Kitchen Physically Abused:   . Sexually  Abused:     Family History  Problem Relation Age of Onset  . Hypertension Mother   . Diabetes Mother   . Diabetes Maternal Grandmother         Objective:  Physical Exam: BP 110/72   Ht 5\' 3"  (1.6 m)   Wt 178 lb (80.7 kg)   LMP 11/02/2019   BMI 31.53 kg/m  Gen: NAD, comfortable in exam room Lungs: Breathing comfortably on room air Hand/wrist Exam Right -Inspection: No deformity, no discoloration -Palpation: Minimal tenderness palpation over the first dorsal compartment.  Significantly improved since her last visit -ROM: Normal range of motion with flexion, extension, radial deviation, ulnar deviation, pronation, supination -Strength: Flexion: 5/5; Extension: 5/5; Radial Deviation: 5/5; Ulnar Deviation: 5/5 -Special Tests: Tinels: Negative; Phalens: Negative; Finkelstein's: Positive, however less uncomfortable than her last visit -Limb neurovascularly intact   Assessment & Plan:  Patient is a 29 y.o. female here for follow-up of right wrist pain  1.  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 -Patient will be referred to hand therapy due to complaints of weakness and stiffness of the thumb -We will avoid nitroglycerin patches at this time as patient informed me that she is now pregnant -Patient may continue to take Tylenol but should avoid NSAIDs -Patient may use thumb spica brace intermittently  Patient will follow up in 4 weeks

## 2020-01-22 ENCOUNTER — Ambulatory Visit: Payer: Medicaid Other | Attending: Family Medicine | Admitting: Occupational Therapy

## 2020-01-22 ENCOUNTER — Other Ambulatory Visit: Payer: Self-pay

## 2020-01-22 ENCOUNTER — Encounter: Payer: Self-pay | Admitting: Occupational Therapy

## 2020-01-22 DIAGNOSIS — R278 Other lack of coordination: Secondary | ICD-10-CM | POA: Diagnosis present

## 2020-01-22 DIAGNOSIS — R209 Unspecified disturbances of skin sensation: Secondary | ICD-10-CM | POA: Diagnosis present

## 2020-01-22 DIAGNOSIS — M25531 Pain in right wrist: Secondary | ICD-10-CM | POA: Diagnosis present

## 2020-01-22 DIAGNOSIS — M25541 Pain in joints of right hand: Secondary | ICD-10-CM | POA: Diagnosis present

## 2020-01-22 DIAGNOSIS — M6281 Muscle weakness (generalized): Secondary | ICD-10-CM

## 2020-01-22 DIAGNOSIS — R208 Other disturbances of skin sensation: Secondary | ICD-10-CM

## 2020-01-22 NOTE — Therapy (Signed)
[facility] [address] [place], [place], [address] Phone: [phone]   Fax:  [phone]  Occupational Therapy Evaluation  Patient Details  Name: Jade Brown MRN: [id number] Date of Birth: 10-13-1990 Referring Provider (OT): Karlton Lemon   Encounter Date: 01/22/2020   OT End of Session - 01/22/20 1524    Visit Number 1    Number of Visits 12    Date for OT Re-Evaluation 03/22/20    Authorization Type Medicaid    Authorization Time Period Awaiting CCME Auth    OT Start Time 1320    OT Stop Time 1400    OT Time Calculation (min) 40 min    Activity Tolerance Patient tolerated treatment well    Behavior During Therapy [facility] for tasks assessed/performed           Past Medical History:  Diagnosis Date  . Gallstones     Past Surgical History:  Procedure Laterality Date  . CHOLECYSTECTOMY N/A 12/04/2013   Procedure: LAPAROSCOPIC CHOLECYSTECTOMY;  Surgeon: Ralene Ok, MD;  Location: [facility];  Service: General;  Laterality: N/A;  . DILATION AND CURETTAGE OF UTERUS    . WISDOM TOOTH EXTRACTION      There were no vitals filed for this visit.   Subjective Assessment - 01/22/20 1331    Subjective  The pain is now going up my arm    Currently in Pain? Yes    Pain Score 5     Pain Location Wrist    Pain Orientation Right    Pain Descriptors / Indicators Aching    Pain Type Chronic pain    Pain Radiating Towards up arm toward shoulder    Pain Onset 1 to 4 weeks ago    Pain Frequency Constant    Aggravating Factors  cleaning up, functional use of right hand    Pain Relieving Factors rest    Effect of Pain on Daily Activities difficult to clean house and take care of kids             [facility] OT Assessment - 01/22/20 0001      Assessment   Medical Diagnosis DeQuervain's tenosynovitis    Referring Provider (OT) Audelia Acton Hudnall    Onset Date/Surgical Date 01/09/20    Hand Dominance Right    Next MD Visit  01/30/20    Prior Therapy NA      ADL   Eating/Feeding Minimal assistance   Drinking hot cup of coffee   Grooming Minimal assistance   using left hand   Upper Body Bathing --   increased time   Lower Body Bathing Increased time    Upper Body Dressing Independent    Lower Body Dressing Increased time    Toileting -  Hygiene Increase time      IADL   Meal Prep --   difficulty with peeling and chopping, pouring off heavy pot     Written Expression   Dominant Hand Right    Handwriting 100% legible;Increased time   pressure with pen uncomfortable     Sensation   Light Touch Appears Intact   Reports numbness worse in am   Stereognosis Appears Intact    Hot/Cold Appears Intact      Coordination   Gross Motor Movements are Fluid and Coordinated Yes    Fine Motor Movements are Fluid and Coordinated No    Coordination and Movement Description Avoids use of right hand especially thumb    [address] Peg Test  Right;Left    Right 9 Hole Peg Test 53.38   avoids use of right thumb   Left 9 Hole Peg Test 28.43      ROM / Strength   AROM / PROM / Strength AROM;Strength      AROM   Overall AROM  Within functional limits for tasks performed   shoulder, elbow, forearm   Right Wrist Extension 50 Degrees    Right Wrist Flexion 60 Degrees      Strength   Overall Strength Within functional limits for tasks performed   shoulders and elbows     Hand Function   Right Hand Gross Grasp Impaired    Right Hand Grip (lbs) 13.8    Right Hand Lateral Pinch 3 lbs    Left Hand Gross Grasp Functional    Left Hand Grip (lbs) 42.9    Left Hand Lateral Pinch 14 lbs                           OT Education - 01/22/20 1523    Education Details Overall evaluation findings and potential goals    Person(s) Educated Patient    Methods Explanation    Comprehension Verbalized understanding            OT Short Term Goals - 01/22/20 1609      OT SHORT TERM GOAL #1   Title Patient will  complete a home exercise program designed to improve range of motion in RUE -  wrist and hand    Baseline No current exercise program    Time 4    Period Weeks    Status New    Target Date 02/21/20      OT SHORT TERM GOAL #2   Title Patient will demonstrate awareness of pain management and activity modifications to reduce symptoms    Baseline Not aware of pain management    Time 4    Period Weeks    Status New      OT SHORT TERM GOAL #3   Title Patient will report pain no more than 3/10 with gentle range of motion activity in RUE    Baseline Pain 5/10 at rest    Time 4    Period Weeks    Status New             OT Long Term Goals - 01/22/20 1613      OT LONG TERM GOAL #1   Title Patient will complete BADL using BUE with pain no greater than 2/10    Baseline Not using RUE functionally for ADL    Time 12    Period Weeks    Status New    Target Date 04/21/20      OT LONG TERM GOAL #2   Title Patient will demonstrate an 8-10 lb improvement in grip strength RUE to help with opening packages, bottles, carrying lightweight items    Baseline 13LB    Time 12    Period Weeks    Status New    Target Date 04/21/20      OT LONG TERM GOAL #3   Title Patient will carry a coffee mug 2/3 full across 15 foot floor space with no more than i incremental increase in pain score    Baseline Unable    Time 12    Period Weeks    Status New      OT LONG TERM GOAL #4   Title Patient  will demonstrate  a 2lb increase in pinch strength with no increase in pain in right thumb    Baseline 3lb    Time 12    Period Weeks    Status New      OT LONG TERM GOAL #5   Title Patient will demonstrate bilateral technique for peeling, chopping vegetables with minimal increase in pain symptoms    Baseline Unable    Time 12    Period Weeks    Status New                 Plan - 01/22/20 1525    Clinical Impression Statement Patient is a 29 year old woman, [redacted] weeks pregnant, with recent  history of wrist and thumb pain.  Patient received a corticosteroid injection in April, and follow up MD appt indicates improvement in symptoms.  Patient is weaning herself from thumb spica brace.  She presents to OT eval with increased pain throughout dominant RUE, weakness, and decreased coordination.  Patient has limited functional use of RUE.  Patient will benefit from skilled OT intervention to decrease pain, improve mobility in right wrist/hand, improve strength and improve overall functional use of dominant right hand.    OT Occupational Profile and History Problem Focused Assessment - Including review of records relating to presenting problem    Occupational performance deficits (Please refer to evaluation for details): ADL's;IADL's    Body Structure / Function / Physical Skills ADL;Coordination;Endurance;UE functional use;Sensation;Fascial restriction;Decreased knowledge of precautions;Body mechanics;Decreased knowledge of use of DME;IADL;Pain;Strength;FMC;Dexterity;Edema;ROM;Tone    Rehab Potential Excellent    Clinical Decision Making Limited treatment options, no task modification necessary    Comorbidities Affecting Occupational Performance: May have comorbidities impacting occupational performance    Modification or Assistance to Complete Evaluation  No modification of tasks or assist necessary to complete eval    OT Frequency 1x / week    OT Duration 12 weeks    OT Treatment/Interventions Self-care/ADL training;Electrical Stimulation;Iontophoresis;Therapeutic exercise;Patient/family education;Splinting;Neuromuscular education;Moist Heat;Fluidtherapy;Therapeutic activities;Passive range of motion;Manual Therapy;DME and/or AE instruction;Cryotherapy;Contrast Bath;Ultrasound    Plan Pain relief and RANGE OF MOTION WRIST/THUMB, start HEP    Consulted and Agree with Plan of Care Patient           Patient will benefit from skilled therapeutic intervention in order to improve the following  deficits and impairments:   Body Structure / Function / Physical Skills: ADL, Coordination, Endurance, UE functional use, Sensation, Fascial restriction, Decreased knowledge of precautions, Body mechanics, Decreased knowledge of use of DME, IADL, Pain, Strength, FMC, Dexterity, Edema, ROM, Tone       Visit Diagnosis: Pain in right wrist - Plan: Ot plan of care cert/re-cert  Pain in joint of right hand - Plan: Ot plan of care cert/re-cert  Muscle weakness (generalized) - Plan: Ot plan of care cert/re-cert  Other lack of coordination - Plan: Ot plan of care cert/re-cert  Other disturbances of skin sensation - Plan: Ot plan of care cert/re-cert    Problem List Patient Active Problem List   Diagnosis Date Noted  . Supervision of normal pregnancy 06/14/2019  . Bacterial vaginosis 06/11/2019  . Group beta Strep positive 05/17/2019  . Rh negative status during pregnancy 12/01/2018  . Supervision of other normal pregnancy, antepartum 11/29/2018    Collier Salina, OTR/L 01/22/2020, 7:10 PM  [facility] [address] [place], [place], [address] Phone: [phone]   Fax:  [phone]  Name: Jade Brown MRN: [id number] Date of  Birth: [birth year]

## 2020-01-30 ENCOUNTER — Ambulatory Visit: Payer: Medicaid Other | Admitting: Sports Medicine

## 2020-01-31 ENCOUNTER — Ambulatory Visit: Payer: Medicaid Other | Admitting: Occupational Therapy

## 2020-02-05 ENCOUNTER — Ambulatory Visit (INDEPENDENT_AMBULATORY_CARE_PROVIDER_SITE_OTHER): Payer: Medicaid Other

## 2020-02-05 DIAGNOSIS — Z3A13 13 weeks gestation of pregnancy: Secondary | ICD-10-CM

## 2020-02-05 DIAGNOSIS — Z3480 Encounter for supervision of other normal pregnancy, unspecified trimester: Secondary | ICD-10-CM

## 2020-02-05 MED ORDER — BLOOD PRESSURE KIT DEVI: 1.0000 | 0 refills | Status: DC | PRN

## 2020-02-05 MED ORDER — GOJJI WEIGHT SCALE MISC: 1.0000 | 0 refills | Status: DC | PRN

## 2020-02-05 NOTE — Progress Notes (Signed)
.    Virtual Visit via Telephone Note  I connected with Delphina Cahill on 02/05/20 at  2:00 PM EDT by telephone and verified that I am speaking with the correct person using two identifiers.  Location: Patient: Jade Brown  Provider: Eduard Roux, CMA    I discussed the limitations, risks, security and privacy concerns of performing an evaluation and management service by telephone and the availability of in person appointments. I also discussed with the patient that there may be a patient responsible charge related to this service. The patient expressed understanding and agreed to proceed.   History of Present Illness: PRENATAL INTAKE SUMMARY  Ms. Waldroup presents today New OB Nurse Interview.  OB History    Gravida  4   Para  2   Term  2   Preterm      AB  1   Living  2     SAB      TAB  1   Ectopic      Multiple  0   Live Births  2          I have reviewed the patient's medical, obstetrical, social, and family histories, medications, and available lab results.  SUBJECTIVE She complains of nausea and vomiting  Taking Diclegis with relief    Observations/Objective: Initial nurse interview for history/labs (New OB)  EDD: 08/08/20 [id number] FHT: not assessed, non face to face interview   GENERAL APPEARANCE: oriented to person, place and time, non face to face interview   Assessment and Plan: Normal pregnancy Prenatal care - [facility]  Labs/Pap to be completed at [facility] provider visit BP cuff sent to [facility] Scale sent to [facility]  Babyscripts explained in detail   Follow Up Instructions:   I discussed the assessment and treatment plan with the patient. The patient was provided an opportunity to ask questions and all were answered. The patient agreed with the plan and demonstrated an understanding of the instructions.   The patient was advised to call back or seek an in-person evaluation if the symptoms worsen or if the condition fails to  improve as anticipated.  I provided 20 minutes of non-face-to-face time during this encounter.   Dalphine Handing, CMA

## 2020-02-05 NOTE — Progress Notes (Signed)
Patient was assessed and managed by nursing staff during this encounter. I have reviewed the chart and agree with the documentation and plan. I have also made any necessary editorial changes.  Irini Leet A Zaydan Papesh, MD 02/05/2020 4:27 PM   

## 2020-02-05 NOTE — Patient Instructions (Signed)
Morning Sickness ° °Morning sickness is when you feel sick to your stomach (nauseous) during pregnancy. You may feel sick to your stomach and throw up (vomit). You may feel sick in the morning, but you can feel this way at any time of day. Some women feel very sick to their stomach and cannot stop throwing up (hyperemesis gravidarum). °Follow these instructions at home: °Medicines °· Take over-the-counter and prescription medicines only as told by your doctor. Do not take any medicines until you talk with your doctor about them first. °· Taking multivitamins before getting pregnant can stop or lessen the harshness of morning sickness. °Eating and drinking °· Eat dry toast or crackers before getting out of bed. °· Eat 5 or 6 small meals a day. °· Eat dry and bland foods like rice and baked potatoes. °· Do not eat greasy, fatty, or spicy foods. °· Have someone cook for you if the smell of food causes you to feel sick or throw up. °· If you feel sick to your stomach after taking prenatal vitamins, take them at night or with a snack. °· Eat protein when you need a snack. Nuts, yogurt, and cheese are good choices. °· Drink fluids throughout the day. °· Try ginger ale made with real ginger, ginger tea made from fresh grated ginger, or ginger candies. °General instructions °· Do not use any products that have nicotine or tobacco in them, such as cigarettes and e-cigarettes. If you need help quitting, ask your doctor. °· Use an air purifier to keep the air in your house free of smells. °· Get lots of fresh air. °· Try to avoid smells that make you feel sick. °· Try: °? Wearing a bracelet that is used for seasickness (acupressure wristband). °? Going to a doctor who puts thin needles into certain body points (acupuncture) to improve how you feel. °Contact a doctor if: °· You need medicine to feel better. °· You feel dizzy or light-headed. °· You are losing weight. °Get help right away if: °· You feel very sick to your  stomach and cannot stop throwing up. °· You pass out (faint). °· You have very bad pain in your belly. °Summary °· Morning sickness is when you feel sick to your stomach (nauseous) during pregnancy. °· You may feel sick in the morning, but you can feel this way at any time of day. °· Making some changes to what you eat may help your symptoms go away. °This information is not intended to replace advice given to you by your health care provider. Make sure you discuss any questions you have with your health care provider. °Document Revised: 07/08/2017 Document Reviewed: 08/26/2016 °Elsevier Patient Education © 2020 [employer] ° °

## 2020-02-08 ENCOUNTER — Other Ambulatory Visit: Payer: Self-pay

## 2020-02-08 ENCOUNTER — Encounter: Payer: Self-pay | Admitting: Occupational Therapy

## 2020-02-08 ENCOUNTER — Ambulatory Visit: Payer: Medicaid Other | Attending: Family Medicine | Admitting: Occupational Therapy

## 2020-02-08 DIAGNOSIS — M25531 Pain in right wrist: Secondary | ICD-10-CM | POA: Insufficient documentation

## 2020-02-08 DIAGNOSIS — R278 Other lack of coordination: Secondary | ICD-10-CM

## 2020-02-08 DIAGNOSIS — R208 Other disturbances of skin sensation: Secondary | ICD-10-CM

## 2020-02-08 DIAGNOSIS — M6281 Muscle weakness (generalized): Secondary | ICD-10-CM | POA: Diagnosis present

## 2020-02-08 DIAGNOSIS — M25541 Pain in joints of right hand: Secondary | ICD-10-CM | POA: Diagnosis present

## 2020-02-08 DIAGNOSIS — R209 Unspecified disturbances of skin sensation: Secondary | ICD-10-CM | POA: Insufficient documentation

## 2020-02-08 NOTE — Patient Instructions (Signed)
Access Code: [email or web address] URL: [email or web address] Date: 02/08/2020 Prepared by: Merleen Milliner  Exercises Wrist AROM Flexion Extension - 1 x daily - 7 x weekly - 3 sets - 10 reps Thumb Opposition - 1 x daily - 7 x weekly - 3 sets - 10 reps Seated Thumb Composite Flexion AROM - 1 x daily - 7 x weekly - 3 sets - 10 reps

## 2020-02-08 NOTE — Therapy (Signed)
[facility] [address] [place], [place], [address] Phone: [phone]   Fax:  [phone]  Occupational Therapy Treatment  Patient Details  Name: Jade Brown MRN: [id number] Date of Birth: [birth year] Referring Provider (OT): Norton Blizzard   Encounter Date: 02/08/2020   OT End of Session - 02/08/20 1537    Visit Number 2    Number of Visits 12    Date for OT Re-Evaluation 03/22/20    Authorization Type Medicaid    Authorization Time Period 3 visits- 6/24-7/14/21    Authorization - Visit Number 1    Authorization - Number of Visits 3    OT Start Time 1501    OT Stop Time 1530    OT Time Calculation (min) 29 min    Activity Tolerance Patient tolerated treatment well    Behavior During Therapy [facility] for tasks assessed/performed           Past Medical History:  Diagnosis Date  . Gallstones     Past Surgical History:  Procedure Laterality Date  . CHOLECYSTECTOMY N/A 12/04/2013   Procedure: LAPAROSCOPIC CHOLECYSTECTOMY;  Surgeon: Axel Filler, MD;  Location[facility];  Service: General;  Laterality: N/A;  . DILATION AND CURETTAGE OF UTERUS    . WISDOM TOOTH EXTRACTION      There were no vitals filed for this visit.   Subjective Assessment - 02/08/20 1506    Subjective  I am trying to use it, and use the splint to rest    Currently in Pain? Yes    Pain Score 8     Pain Location Wrist    Pain Orientation Right    Pain Descriptors / Indicators Aching    Pain Type Chronic pain    Pain Onset More than a month ago    Pain Frequency Constant    Aggravating Factors  use    Pain Relieving Factors rest, heat                        OT Treatments/Exercises (OP) - 02/08/20 0001      Modalities   Modalities Fluidotherapy      RUE Fluidotherapy   Number Minutes Fluidotherapy 10 Minutes    RUE Fluidotherapy Location Hand;Wrist;Forearm    Comments pain relief pain to 5/10 from 8/10      Manual  Therapy   Manual Therapy Soft tissue mobilization;Edema management    Edema Management radial aspect of wrist, volar forearm, thenar eminence                  OT Education - 02/08/20 1536    Education Details started gentle AROM exercises wrist/thumb,  benefit of heat for pain management    Person(s) Educated Patient    Methods Explanation;Demonstration;Handout    Comprehension Verbalized understanding;Returned demonstration            OT Short Term Goals - 02/08/20 1538      OT SHORT TERM GOAL #1   Title Patient will complete a home exercise program designed to improve range of motion in RUE -  wrist and hand    Status On-going      OT SHORT TERM GOAL #2   Title Patient will demonstrate awareness of pain management and activity modifications to reduce symptoms    Status On-going      OT SHORT TERM GOAL #3   Title Patient will report pain no more than 3/10 with gentle range of  motion activity in RUE    Status On-going             OT Long Term Goals - 01/22/20 1613      OT LONG TERM GOAL #1   Title Patient will complete BADL using BUE with pain no greater than 2/10    Baseline Not using RUE functionally for ADL    Time 12    Period Weeks    Status New    Target Date 04/21/20      OT LONG TERM GOAL #2   Title Patient will demonstrate an 8-10 lb improvement in grip strength RUE to help with opening packages, bottles, carrying lightweight items    Baseline 13LB    Time 12    Period Weeks    Status New    Target Date 04/21/20      OT LONG TERM GOAL #3   Title Patient will carry a coffee mug 2/3 full across 15 foot floor space with no more than i incremental increase in pain score    Baseline Unable    Time 12    Period Weeks    Status New      OT LONG TERM GOAL #4   Title Patient will demonstrate  a 2lb increase in pinch strength with no increase in pain in right thumb    Baseline 3lb    Time 12    Period Weeks    Status New      OT LONG TERM GOAL  #5   Title Patient will demonstrate bilateral technique for peeling, chopping vegetables with minimal increase in pain symptoms    Baseline Unable    Time 12    Period Weeks    Status New                 Plan - 02/08/20 1539    Clinical Impression Statement Patient continues with significant pain in right hand/wrist.  Patient responded favorably to heat and gentle mobilization this session    OT Frequency 1x / week    OT Duration 12 weeks    OT Treatment/Interventions Self-care/ADL training;Electrical Stimulation;Iontophoresis;Therapeutic exercise;Patient/family education;Splinting;Neuromuscular education;Moist Heat;Fluidtherapy;Therapeutic activities;Passive range of motion;Manual Therapy;DME and/or AE instruction;Cryotherapy;Contrast Bath;Ultrasound    Plan fluidotherapy for pain wrist/thumb, range of motion - add wrist/thumb motion as able - gentle functional use    OT Home Exercise Plan thumb/wrist exercises    Consulted and Agree with Plan of Care Patient           Patient will benefit from skilled therapeutic intervention in order to improve the following deficits and impairments:           Visit Diagnosis: Pain in right wrist  Pain in joint of right hand  Muscle weakness (generalized)  Other lack of coordination  Other disturbances of skin sensation    Problem List Patient Active Problem List   Diagnosis Date Noted  . Supervision of normal pregnancy 06/14/2019  . Bacterial vaginosis 06/11/2019  . Group beta Strep positive 05/17/2019  . Rh negative status during pregnancy 12/01/2018  . Supervision of other normal pregnancy, antepartum 11/29/2018    Collier Salina, OTR/L 02/08/2020, 3:41 PM  [facility] [address] [place], [place], [address] Phone: [phone]   Fax:  [phone]  Name: Jade Brown MRN: [id number] Date of Birth: April 17, 1991

## 2020-02-12 ENCOUNTER — Encounter: Payer: Medicaid Other | Admitting: Obstetrics and Gynecology

## 2020-02-18 ENCOUNTER — Other Ambulatory Visit: Payer: Self-pay

## 2020-02-18 ENCOUNTER — Ambulatory Visit (INDEPENDENT_AMBULATORY_CARE_PROVIDER_SITE_OTHER): Payer: Medicaid Other | Admitting: Family Medicine

## 2020-02-18 VITALS — BP 106/60 | Ht 63.0 in | Wt 177.0 lb

## 2020-02-18 DIAGNOSIS — M654 Radial styloid tenosynovitis [de Quervain]: Secondary | ICD-10-CM

## 2020-02-18 HISTORY — DX: Radial styloid tenosynovitis (de quervain): M65.4

## 2020-02-18 NOTE — Assessment & Plan Note (Signed)
1.  Apply heat to your right thumb/wrist as needed. 2.  Continue to wear your thumb spica brace as often as possible. 3.  Continue therapy.  Ideally you will have completed 8 sessions until you have better results. 4.  You can take Tylenol 500 mg every 6 hours (4 times daily) as needed for pain. 5. Follow-up with [place] in 4-6 weeks.  If no better we can consider repeat steroid injection to the tendon as well as referral to a hand surgeon for evaluation.

## 2020-02-18 NOTE — Progress Notes (Signed)
Jade Brown - 29 y.o. female MRN [id number]  Date of birth: 1991-05-27    SUBJECTIVE:      Chief Complaint:/ HPI:  Follow-up R wrist pain  Office visit 12/05/2019: Patient is a 29 y.o. female here for evaluation of right wrist pain.  Pain is been present for the last several months.  She denies any specific injury or trauma.  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  Pain is located on the radial aspect of wrist and radiates up her arm.  She also notes numbness and tingling affecting all of her fingers.  She was seen at urgent care who diagnosed her with de Quervain's tenosynovitis.  She was given a thumb spica wrist brace and instructed to follow-up with sports medicine.  Patient notes the wrist brace is not really helped her pain at all.  She notes raising her arm above her head will help with some of the radiating pain up her arm however she still getting pain at the wrist.  Of note patient has a 72-month-old child at home and notes picking up her child has been bothering her wrist as well. -CSI of the first dorsal compartment.   -Patient will continue wearing the thumb spica -Follow-up 4-6 weeks  Office visit 01/09/2020: Patient is a 29 y.o. female here for follow-up of right wrist pain.  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  Patient was given a corticosteroid injection into the tendon sheath of the first dorsal compartment.  She was also wearing a thumb spica brace intermittently.  Patient notes roughly 50% improvement in her symptoms since her last visit.  She is no longer wearing the thumb spica brace full-time but will only use it occasionally now.  Patient is able to work with minimal difficulty at this time.  Patient denies any numbness or tingling in her hands.  She denies any bruising or swelling.  Patient does note some weakness and stiffness of her thumb since her last visit. -Patient doing significantly  better from her last visit following injection and intermittent bracing. -Patient will be referred to hand therapy due to complaints of weakness and stiffness of the thumb -We will avoid nitroglycerin patches at this time as patient informed me that she is now pregnant -Patient may continue to take Tylenol but should avoid NSAIDs -Patient may use thumb spica brace intermittently  02/18/2020: Today she reports she is essentially unchanged from previous exams.  She reports that she has been to see hand therapy twice, is still doing the exercises at home.  Is to the point now where she has so much pain she can even hold a coffee cup.  Is allowed to take Tylenol but has not been taking it.  She tends to wear her brace still when she is sleeping and will put it on before she does lots of activity with her hands as a way of preventing further pain.  Denies any falls or trauma.  Reports that therapy and heat really help.   ROS:     See HPI  PERTINENT  PMH / PSH / FH / SH:  Past Medical, Surgical, Social, and Family History Reviewed & Updated in the EMR.  Pertinent findings include:  Patient Active Problem List   Diagnosis Date Noted  . Supervision of normal pregnancy 06/14/2019  . Bacterial vaginosis 06/11/2019  . Group beta Strep positive 05/17/2019  . Rh negative status  during pregnancy 12/01/2018  . Supervision of other normal pregnancy, antepartum 11/29/2018     OBJECTIVE: BP 106/60   Ht 5\' 3"  (1.6 m)   Wt 177 lb (80.3 kg)   LMP 11/02/2019   BMI 31.35 kg/m   Physical Exam:  Vital signs are reviewed.  GEN: Alert and oriented, NAD Pulm: Breathing unlabored PSY: normal mood, congruent affect  MSK: Right hand: Inspection: No obvious deformity, swelling, erythema or bruising Palpation: Minimal TTP over right radial styloid ROM: Full ROM of the digits and wrist Strength: 5/5 strength in the left forearm, wrist and interosseus muscles; 4/5 grip strength with right hand, otherwise 5/5  strength with wrist and interosseus muscles Neurovascular: NV intact Special tests: finkelstein's elicits pain in right forearm  ASSESSMENT & PLAN:  1.  De Quervain's tenosynovitis, right: Patient given the following instructions -Apply heat to your right thumb/wrist as needed. -Continue to wear your thumb spica brace as often as possible. -Continue therapy.  Ideally you will have completed 8 sessions until you have better results. -You can take Tylenol 500 mg every 6 hours (4 times daily) as needed for pain. -Follow-up with 11/04/2019 in 4-6 weeks.  If no better we can consider repeat steroid injection to the tendon as well as referral to a hand surgeon for evaluation.   [place], DO [facility], PGY-3 02/18/2020 3:17 PM

## 2020-02-18 NOTE — Patient Instructions (Signed)
Thank you for coming in to see [place] today! Please see below to review our plan for today's visit:  1.  Apply heat to your right thumb/wrist as needed. 2.  Continue to wear your thumb spica brace as often as possible. 3.  Continue therapy.  Ideally you will have completed 8 sessions until you have better results. 4.  You can take Tylenol 500 mg every 6 hours (4 times daily) as needed for pain. 5. Follow-up with [place] in 4-6 weeks.  If no better we can consider repeat steroid injection to the tendon as well as referral to a hand surgeon for evaluation.  Please call the clinic at [phone] if your symptoms worsen or you have any concerns. It was our pleasure to serve you!   Dr. Norton Blizzard Dr. Peggyann Shoals [facility]

## 2020-02-19 ENCOUNTER — Encounter: Payer: Self-pay | Admitting: Family Medicine

## 2020-02-20 ENCOUNTER — Other Ambulatory Visit: Payer: Self-pay

## 2020-02-20 ENCOUNTER — Ambulatory Visit: Payer: Medicaid Other | Admitting: Occupational Therapy

## 2020-02-20 DIAGNOSIS — R278 Other lack of coordination: Secondary | ICD-10-CM

## 2020-02-20 DIAGNOSIS — M6281 Muscle weakness (generalized): Secondary | ICD-10-CM

## 2020-02-20 DIAGNOSIS — M25541 Pain in joints of right hand: Secondary | ICD-10-CM

## 2020-02-20 DIAGNOSIS — M25531 Pain in right wrist: Secondary | ICD-10-CM | POA: Diagnosis not present

## 2020-02-20 DIAGNOSIS — R208 Other disturbances of skin sensation: Secondary | ICD-10-CM

## 2020-02-20 NOTE — Patient Instructions (Signed)
Consider using heat for 8-10 mins prior to exercises wrapped in a towel.  Bend your wrist while keeping thumb stationary 10 reps 3x day     MP Flexion (Active)   Bend thumb to touch base of little finger, keeping tip joint straight. Keep wrist straight Repeat __10-15__ times. Do _3___ sessions per day.    Wear your wrist and thumb most of the time when not exercising, this will help protect your wrist.  Consider using an ice pack for 8 mins wrapped in a towel for pain and inflammation.

## 2020-02-21 ENCOUNTER — Encounter: Payer: Self-pay | Admitting: Occupational Therapy

## 2020-02-21 NOTE — Therapy (Signed)
[facility] [address] [place], [place], [address] Phone: [phone]   Fax:  [phone]  Occupational Therapy Treatment  Patient Details  Name: Jade Brown MRN: [id number] Date of Birth: 02-May-1991 Referring Provider (OT): Norton Blizzard   Encounter Date: 02/20/2020   OT End of Session - 02/20/20 1423    Visit Number 3    Number of Visits 12    Date for OT Re-Evaluation 03/22/20    Authorization Type Medicaid    Authorization Time Period 3 visits- 6/24-7/14/21    Authorization - Visit Number 2    Authorization - Number of Visits 3    OT Start Time 1403    OT Stop Time 1443    OT Time Calculation (min) 40 min    Equipment Utilized During Treatment 2 units only ice pack    Activity Tolerance Patient tolerated treatment well    Behavior During Therapy WFL for tasks assessed/performed           Past Medical History:  Diagnosis Date   Gallstones     Past Surgical History:  Procedure Laterality Date   CHOLECYSTECTOMY N/A 12/04/2013   Procedure: LAPAROSCOPIC CHOLECYSTECTOMY;  Surgeon: Axel Filler, MD;  Location: [facility] OR;  Service: General;  Laterality: N/A;   DILATION AND CURETTAGE OF UTERUS     WISDOM TOOTH EXTRACTION      There were no vitals filed for this visit.   Subjective Assessment - 02/20/20 1418    Subjective  Pt reports pain was really bad today and she could barely get out of bed    Currently in Pain? Yes    Pain Score 8     Pain Location Wrist    Pain Orientation Right    Pain Descriptors / Indicators Aching    Pain Type Acute pain    Pain Onset More than a month ago    Pain Frequency Intermittent    Aggravating Factors  overuse    Pain Relieving Factors heat             Treatment: Pt arrived without her brace. Therapist reinforce importance of splint wear. Fluidotherapy x 10 mins, for pain relief, no adverse reactions. Reviewed exercises for wrist and thumb movement  separately. 10 reps each. Gentle soft tissue mobs / massage to right wrist . Gentle passive wrist extension. Ice pack applied x 8 mins pain reduced to 5/10.                  OT Education - 02/21/20 [phone]    Education Details importance of consistent splint wear, use of ice and heat, HEP    Person(s) Educated Patient    Methods Explanation;Demonstration;Handout    Comprehension Verbalized understanding;Returned demonstration;Verbal cues required            OT Short Term Goals - 02/20/20 1415      OT SHORT TERM GOAL #1   Title Patient will complete a home exercise program designed to improve range of motion in RUE -  wrist and hand    Status On-going      OT SHORT TERM GOAL #2   Title Patient will demonstrate awareness of pain management and activity modifications to reduce symptoms    Status On-going      OT SHORT TERM GOAL #3   Title Patient will report pain no more than 3/10 with gentle range of motion activity in RUE    Status On-going  OT Long Term Goals - 01/22/20 1613      OT LONG TERM GOAL #1   Title Patient will complete BADL using BUE with pain no greater than 2/10    Baseline Not using RUE functionally for ADL    Time 12    Period Weeks    Status New    Target Date 04/21/20      OT LONG TERM GOAL #2   Title Patient will demonstrate an 8-10 lb improvement in grip strength RUE to help with opening packages, bottles, carrying lightweight items    Baseline 13LB    Time 12    Period Weeks    Status New    Target Date 04/21/20      OT LONG TERM GOAL #3   Title Patient will carry a coffee mug 2/3 full across 15 foot floor space with no more than i incremental increase in pain score    Baseline Unable    Time 12    Period Weeks    Status New      OT LONG TERM GOAL #4   Title Patient will demonstrate  a 2lb increase in pinch strength with no increase in pain in right thumb    Baseline 3lb    Time 12    Period Weeks    Status New       OT LONG TERM GOAL #5   Title Patient will demonstrate bilateral technique for peeling, chopping vegetables with minimal increase in pain symptoms    Baseline Unable    Time 12    Period Weeks    Status New                 Plan - 02/20/20 1414    Clinical Impression Statement Patient continues with significant pain in right hand/wrist.  Pt. is not wearing her wrist brace consistently and she did not take any tylenol for pain.    Body Structure / Function / Physical Skills Edema;Strength;UE functional use;Pain;FMC;Flexibility;Coordination;GMC;ROM;Dexterity;IADL;Sensation    OT Frequency 1x / week    OT Duration 12 weeks    OT Treatment/Interventions Self-care/ADL training;Electrical Stimulation;Iontophoresis;Therapeutic exercise;Patient/family education;Splinting;Neuromuscular education;Moist Heat;Fluidtherapy;Therapeutic activities;Passive range of motion;Manual Therapy;DME and/or AE instruction;Cryotherapy;Contrast Bath;Ultrasound    Plan pt is only scheduled for 1 more visit, check compliance and decide whether to add visits, fluidotherapy for pain wrist/thumb, range of motion - add wrist/thumb motion as able - gentle functional use    OT Home Exercise Plan thumb/wrist exercises    Consulted and Agree with Plan of Care Patient           Patient will benefit from skilled therapeutic intervention in order to improve the following deficits and impairments:   Body Structure / Function / Physical Skills: Edema, Strength, UE functional use, Pain, FMC, Flexibility, Coordination, GMC, ROM, Dexterity, IADL, Sensation       Visit Diagnosis: Pain in right wrist  Pain in joint of right hand  Muscle weakness (generalized)  Other lack of coordination  Other disturbances of skin sensation    Problem List Patient Active Problem List   Diagnosis Date Noted   De Quervain's tenosynovitis, right 02/18/2020   Supervision of normal pregnancy 06/14/2019   Bacterial vaginosis  06/11/2019   Group beta Strep positive 05/17/2019   Rh negative status during pregnancy 12/01/2018   Supervision of other normal pregnancy, antepartum 11/29/2018    Jade Brown 02/21/2020, 8:13 AM  [facility] [address] [place], [place], [address] Phone:  [phone]   Fax:  [phone]  Name: Jade Brown MRN: [id number] Date of Birth: 13-Sep-1990

## 2020-02-27 ENCOUNTER — Ambulatory Visit: Payer: Medicaid Other | Admitting: Occupational Therapy

## 2020-02-27 ENCOUNTER — Other Ambulatory Visit: Payer: Self-pay

## 2020-02-27 DIAGNOSIS — M6281 Muscle weakness (generalized): Secondary | ICD-10-CM

## 2020-02-27 DIAGNOSIS — M25531 Pain in right wrist: Secondary | ICD-10-CM | POA: Diagnosis not present

## 2020-02-27 DIAGNOSIS — R208 Other disturbances of skin sensation: Secondary | ICD-10-CM

## 2020-02-27 DIAGNOSIS — R278 Other lack of coordination: Secondary | ICD-10-CM

## 2020-02-27 DIAGNOSIS — M25541 Pain in joints of right hand: Secondary | ICD-10-CM

## 2020-02-27 NOTE — Therapy (Signed)
[facility] [address] [place], [place], [address] Phone: [phone]   Fax:  [phone]  Occupational Therapy Treatment  Patient Details  Name: Jade Brown MRN: [id number] Date of Birth: 11/28/1990 Referring Provider (OT): Karlton Lemon   Encounter Date: 02/27/2020   OT End of Session - 02/27/20 1354    Visit Number 4    Number of Visits 12    Date for OT Re-Evaluation 03/22/20    Authorization Type UHC Medicaid    [place] Medicaid    Authorization - Visit Number 3    Authorization - Number of Visits 12    OT Start Time 3244    OT Stop Time 1400    OT Time Calculation (min) 43 min    Equipment Utilized During Treatment 2 units only ice pack    Activity Tolerance Patient tolerated treatment well    Behavior During Therapy WFL for tasks assessed/performed           Past Medical History:  Diagnosis Date  . Gallstones     Past Surgical History:  Procedure Laterality Date  . CHOLECYSTECTOMY N/A 12/04/2013   Procedure: LAPAROSCOPIC CHOLECYSTECTOMY;  Surgeon: Ralene Ok, MD;  Location: [facility];  Service: General;  Laterality: N/A;  . DILATION AND CURETTAGE OF UTERUS    . WISDOM TOOTH EXTRACTION      There were no vitals filed for this visit.   Subjective Assessment - 02/27/20 1610    Subjective  Pt reports she has been wearing her splint    Currently in Pain? Yes    Pain Score 4     Pain Location Wrist    Pain Orientation Right    Pain Descriptors / Indicators Aching    Pain Type Acute pain    Pain Onset More than a month ago    Pain Frequency Intermittent    Aggravating Factors  overuse    Pain Relieving Factors ice, heat                Treatment: Pt arrived wearing her brace her brace.  Fluidotherapy x 10 mins, for pain relief, no adverse reactions. P/ROM wrist flexion/ extension, ulnar/ radial deviation  Reviewed exercises for wrist and thumb movement  separately. 10 reps each. Gentle soft tissue mobs / massage to right wrist . Ice pack applied x 8 mins pain no pain end of session                 OT Education - 02/27/20 1609    Education Details importance of consistent splint wear, use of ice and heat, HEP, avoiding repitative gripping/ pinching.    Person(s) Educated Patient    Methods Explanation;Demonstration    Comprehension Verbalized understanding;Returned demonstration            OT Short Term Goals - 02/27/20 1348      OT SHORT TERM GOAL #1   Title Patient will complete a home exercise program designed to improve range of motion in RUE -  wrist and hand    Status Achieved      OT SHORT TERM GOAL #2   Title Patient will demonstrate awareness of pain management and activity modifications to reduce symptoms    Status Achieved      OT SHORT TERM GOAL #3   Title Patient will report pain no more than 3/10 with gentle range of motion activity in RUE    Status Not Met   3-4/10  OT Long Term Goals - 02/27/20 1349      OT LONG TERM GOAL #1   Title Patient will complete BADL using BUE with pain no greater than 2/10    Baseline Not using RUE functionally for ADL    Time 12    Period Weeks    Status Not Met      OT LONG TERM GOAL #2   Title Patient will demonstrate an 8-10 lb improvement in grip strength RUE to help with opening packages, bottles, carrying lightweight items    Baseline 13LB    Time 12    Period Weeks    Status New      OT LONG TERM GOAL #3   Title Patient will carry a coffee mug 2/3 full across 15 foot floor space with no more than i incremental increase in pain score    Baseline Unable    Time 12    Period Weeks    Status New      OT LONG TERM GOAL #4   Title Patient will demonstrate  a 2lb increase in pinch strength with no increase in pain in right thumb    Baseline 3lb    Time 12    Period Weeks    Status New      OT LONG TERM GOAL #5   Title Patient will  demonstrate bilateral technique for peeling, chopping vegetables with minimal increase in pain symptoms    Baseline Unable    Time 12    Period Weeks    Status New                  Patient will benefit from skilled therapeutic intervention in order to improve the following deficits and impairments:           Visit Diagnosis: Pain in right wrist  Pain in joint of right hand  Muscle weakness (generalized)  Other lack of coordination  Other disturbances of skin sensation    Problem List Patient Active Problem List   Diagnosis Date Noted  . De Quervain's tenosynovitis, right 02/18/2020  . Supervision of normal pregnancy 06/14/2019  . Bacterial vaginosis 06/11/2019  . Group beta Strep positive 05/17/2019  . Rh negative status during pregnancy 12/01/2018  . Supervision of other normal pregnancy, antepartum 11/29/2018    Donshay Lupinski 02/27/2020, 4:11 PM  [facility] [address] [place], [place], [address] Phone: [phone]   Fax:  [phone]  Name: Jade Brown MRN: [id number] Date of Birth: Mar 19, 1991

## 2020-03-13 ENCOUNTER — Ambulatory Visit: Payer: Medicaid Other | Attending: Family Medicine | Admitting: Occupational Therapy

## 2020-03-17 ENCOUNTER — Ambulatory Visit: Payer: Medicaid Other | Admitting: Family Medicine

## 2020-04-22 ENCOUNTER — Encounter: Payer: Medicaid Other | Admitting: Obstetrics

## 2020-05-22 ENCOUNTER — Encounter: Payer: Self-pay | Admitting: Obstetrics

## 2020-07-02 ENCOUNTER — Encounter: Payer: Medicaid Other | Admitting: Obstetrics

## 2020-07-17 ENCOUNTER — Other Ambulatory Visit (HOSPITAL_COMMUNITY)
Admission: RE | Admit: 2020-07-17 | Discharge: 2020-07-17 | Disposition: A | Discharge status: Home / Self Care | Payer: Medicaid Other | Source: Ambulatory Visit | Attending: Obstetrics | Admitting: Obstetrics

## 2020-07-17 ENCOUNTER — Ambulatory Visit (INDEPENDENT_AMBULATORY_CARE_PROVIDER_SITE_OTHER): Payer: Medicaid Other | Admitting: Obstetrics

## 2020-07-17 ENCOUNTER — Other Ambulatory Visit: Payer: Self-pay

## 2020-07-17 ENCOUNTER — Encounter: Payer: Self-pay | Admitting: Obstetrics

## 2020-07-17 VITALS — BP 107/56 | HR 68 | Wt 189.0 lb

## 2020-07-17 DIAGNOSIS — Z6791 Unspecified blood type, Rh negative: Secondary | ICD-10-CM

## 2020-07-17 DIAGNOSIS — Z3480 Encounter for supervision of other normal pregnancy, unspecified trimester: Secondary | ICD-10-CM | POA: Insufficient documentation

## 2020-07-17 DIAGNOSIS — Z3483 Encounter for supervision of other normal pregnancy, third trimester: Secondary | ICD-10-CM | POA: Diagnosis not present

## 2020-07-17 DIAGNOSIS — Z3A36 36 weeks gestation of pregnancy: Secondary | ICD-10-CM | POA: Diagnosis not present

## 2020-07-17 DIAGNOSIS — O0930 Supervision of pregnancy with insufficient antenatal care, unspecified trimester: Secondary | ICD-10-CM

## 2020-07-17 DIAGNOSIS — O26893 Other specified pregnancy related conditions, third trimester: Secondary | ICD-10-CM

## 2020-07-17 MED ORDER — RHO D IMMUNE GLOBULIN 1500 UNIT/2ML IJ SOSY
300.0000 ug | PREFILLED_SYRINGE | Freq: Once | INTRAMUSCULAR | Status: AC
  Administered 2020-07-17: 300 ug via INTRAMUSCULAR

## 2020-07-17 MED ORDER — PRENATE MINI 29-0.6-0.4-350 MG PO CAPS: 1.0000 | ORAL_CAPSULE | Freq: Every day | ORAL | 3 refills | Status: DC

## 2020-07-17 NOTE — Addendum Note (Signed)
Addended by: Marya Landry D on: 07/17/2020 09:13 AM   Modules accepted: Orders

## 2020-07-17 NOTE — Progress Notes (Signed)
Subjective:    Jade Brown is being seen today for her first obstetrical visit.  This is not a planned pregnancy. She is at [redacted]w[redacted]d gestation. Her obstetrical history is significant for none. Relationship with FOB: significant other, living together. Patient does intend to breast feed. Pregnancy history fully reviewed.  The information documented in the HPI was reviewed and verified.  Menstrual History: OB History    Gravida  4   Para  2   Term  2   Preterm      AB  1   Living  2     SAB      IAB  1   Ectopic      Multiple  0   Live Births  2            Patient's last menstrual period was 11/02/2019.    Past Medical History:  Diagnosis Date  . Gallstones     Past Surgical History:  Procedure Laterality Date  . CHOLECYSTECTOMY N/A 12/04/2013   Procedure: LAPAROSCOPIC CHOLECYSTECTOMY;  Surgeon: Axel Filler, MD;  Location: [facility] OR;  Service: General;  Laterality: N/A;  . DILATION AND CURETTAGE OF UTERUS    . WISDOM TOOTH EXTRACTION      (Not in a hospital admission)  Allergies  Allergen Reactions  . Flagyl [Metronidazole] Nausea And Vomiting    Social History   Tobacco Use  . Smoking status: Former Smoker    Years: 2.00    Types: Cigars    Quit date: 01/12/2011    Years since quitting: 9.5  . Smokeless tobacco: Never Used  Substance Use Topics  . Alcohol use: No    Family History  Problem Relation Age of Onset  . Hypertension Mother   . Diabetes Mother   . Diabetes Maternal Grandmother      Review of Systems Constitutional: negative for weight loss Gastrointestinal: negative for vomiting Genitourinary:negative for genital lesions and vaginal discharge and dysuria Musculoskeletal:negative for back pain Behavioral/Psych: negative for abusive relationship, depression, illegal drug usage and tobacco use    Objective:    BP (!) 107/56   Pulse 68   Wt 189 lb (85.7 kg)   LMP 11/02/2019   BMI 33.48 kg/m  General Appearance:    Alert,  cooperative, no distress, appears stated age  Head:    Normocephalic, without obvious abnormality, atraumatic  Eyes:    PERRL, conjunctiva/corneas clear, EOM's intact, fundi    benign, both eyes  Ears:    Normal TM's and external ear canals, both ears  Nose:   Nares normal, septum midline, mucosa normal, no drainage    or sinus tenderness  Throat:   Lips, mucosa, and tongue normal; teeth and gums normal  Neck:   Supple, symmetrical, trachea midline, no adenopathy;    thyroid:  no enlargement/tenderness/nodules; no carotid   bruit or JVD  Back:     Symmetric, no curvature, ROM normal, no CVA tenderness  Lungs:     Clear to auscultation bilaterally, respirations unlabored  Chest Wall:    No tenderness or deformity   Heart:    Regular rate and rhythm, S1 and S2 normal, no murmur, rub   or gallop  Breast Exam:    No tenderness, masses, or nipple abnormality  Abdomen:     Soft, non-tender, bowel sounds active all four quadrants,    no masses, no organomegaly  Genitalia:    Normal female without lesion, discharge or tenderness  Extremities:   Extremities normal, atraumatic,  no cyanosis or edema  Pulses:   2+ and symmetric all extremities  Skin:   Skin color, texture, turgor normal, no rashes or lesions  Lymph nodes:   Cervical, supraclavicular, and axillary nodes normal  Neurologic:   CNII-XII intact, normal strength, sensation and reflexes    throughout      Lab Review Urine pregnancy test Labs reviewed yes Radiologic studies reviewed no  Assessment:    Pregnancy at [redacted]w[redacted]d weeks    Plan:     1. Supervision of other normal pregnancy, antepartum Rx: - CBC/D/Plt+RPR+Rh+ABO+Rub Ab... - Culture, OB Urine - Strep Gp B NAA - Cervicovaginal ancillary only( [place]) - Hemoglobin A1c - [place] MFM OB DETAIL +14 WK; Future  2. Late prenatal care  3. Rh negative status during pregnancy in third trimester - Rhogam today and postpartum   Prenatal vitamins.  Counseling provided  regarding continued use of seat belts, cessation of alcohol consumption, smoking or use of illicit drugs; infection precautions i.e., influenza/TDAP immunizations, toxoplasmosis,CMV, parvovirus, listeria and varicella; workplace safety, exercise during pregnancy; routine dental care, safe medications, sexual activity, hot tubs, saunas, pools, travel, caffeine use, fish and methlymercury, potential toxins, hair treatments, varicose veins Weight gain recommendations per IOM guidelines reviewed: underweight/BMI< 18.5--> gain 28 - 40 lbs; normal weight/BMI 18.5 - 24.9--> gain 25 - 35 lbs; overweight/BMI 25 - 29.9--> gain 15 - 25 lbs; obese/BMI >30->gain  11 - 20 lbs Problem list reviewed and updated. FIRST/CF mutation testing/NIPT/QUAD SCREEN/fragile X/Ashkenazi Jewish population testing/Spinal muscular atrophy discussed: declined. Role of ultrasound in pregnancy discussed; fetal survey: requested. Amniocentesis discussed: not indicated.   Orders Placed This Encounter  Procedures  . Culture, OB Urine  . Strep Gp B NAA  . [place] MFM OB DETAIL +14 WK    Standing Status:   Future    Standing Expiration Date:   07/17/2021    Order Specific Question:   Reason for Exam (SYMPTOM  OR DIAGNOSIS REQUIRED)    Answer:   Anatomy    Order Specific Question:   Preferred Location    Answer:   [facility] Ultrasound  . CBC/D/Plt+RPR+Rh+ABO+Rub Ab...  . Hemoglobin A1c    Follow up in 1 weeks. 50% of 20 min visit spent on counseling and coordination of care.    Brock Bad, MD 07/17/2020 8:41 AM

## 2020-07-17 NOTE — Addendum Note (Signed)
Addended by: Coral Ceo A on: 07/17/2020 09:12 AM   Modules accepted: Orders

## 2020-07-18 LAB — CBC/D/PLT+RPR+RH+ABO+RUB AB...
Antibody Screen: NEGATIVE
Basophils Absolute: 0.1 10*3/uL (ref 0.0–0.2)
Basos: 0 %
EOS (ABSOLUTE): 0.2 10*3/uL (ref 0.0–0.4)
Eos: 1 %
HCV Ab: <0.1 s/co ratio (ref 0.0–0.9)
HIV Screen 4th Generation wRfx: NONREACTIVE
Hematocrit: 32.5 % — ABNORMAL LOW (ref 34.0–46.6)
Hemoglobin: 11.1 g/dL (ref 11.1–15.9)
Hepatitis B Surface Ag: NEGATIVE
Immature Grans (Abs): 0.2 10*3/uL — ABNORMAL HIGH (ref 0.0–0.1)
Immature Granulocytes: 1 %
Lymphocytes Absolute: 2.6 10*3/uL (ref 0.7–3.1)
Lymphs: 16 %
MCH: 31.4 pg (ref 26.6–33.0)
MCHC: 34.2 g/dL (ref 31.5–35.7)
MCV: 92 fL (ref 79–97)
Monocytes Absolute: 1.1 10*3/uL — ABNORMAL HIGH (ref 0.1–0.9)
Monocytes: 7 %
Neutrophils Absolute: 11.5 10*3/uL — ABNORMAL HIGH (ref 1.4–7.0)
Neutrophils: 75 %
Platelets: 280 10*3/uL (ref 150–450)
RBC: 3.54 x10E6/uL — ABNORMAL LOW (ref 3.77–5.28)
RDW: 12.5 % (ref 11.7–15.4)
RPR Ser Ql: NONREACTIVE
Rh Factor: NEGATIVE
Rubella Antibodies, IGG: 1.18 index (ref >0.99)
WBC: 15.6 10*3/uL — ABNORMAL HIGH (ref 3.4–10.8)

## 2020-07-18 LAB — CERVICOVAGINAL ANCILLARY ONLY
Bacterial Vaginitis (gardnerella): POSITIVE — AB
Chlamydia: NEGATIVE
Comment: NEGATIVE
Comment: NEGATIVE
Comment: NEGATIVE
Comment: NORMAL
Neisseria Gonorrhea: NEGATIVE
Trichomonas: NEGATIVE

## 2020-07-18 LAB — HCV INTERPRETATION

## 2020-07-18 LAB — HEMOGLOBIN A1C
Est. average glucose Bld gHb Est-mCnc: 114 mg/dL
Hgb A1c MFr Bld: 5.6 % (ref 4.8–5.6)

## 2020-07-19 LAB — STREP GP B NAA: Strep Gp B NAA: NEGATIVE

## 2020-07-19 LAB — URINE CULTURE, OB REFLEX

## 2020-07-19 LAB — CULTURE, OB URINE

## 2020-07-21 ENCOUNTER — Ambulatory Visit: Payer: Medicaid Other | Admitting: *Deleted

## 2020-07-21 ENCOUNTER — Encounter: Payer: Self-pay | Admitting: *Deleted

## 2020-07-21 ENCOUNTER — Ambulatory Visit (HOSPITAL_BASED_OUTPATIENT_CLINIC_OR_DEPARTMENT_OTHER): Payer: Medicaid Other | Admitting: *Deleted

## 2020-07-21 ENCOUNTER — Ambulatory Visit: Payer: Medicaid Other | Attending: Obstetrics

## 2020-07-21 ENCOUNTER — Other Ambulatory Visit: Payer: Self-pay | Admitting: Obstetrics

## 2020-07-21 ENCOUNTER — Other Ambulatory Visit: Payer: Self-pay

## 2020-07-21 ENCOUNTER — Telehealth: Payer: Self-pay

## 2020-07-21 DIAGNOSIS — Z3480 Encounter for supervision of other normal pregnancy, unspecified trimester: Secondary | ICD-10-CM

## 2020-07-21 DIAGNOSIS — O368390 Maternal care for abnormalities of the fetal heart rate or rhythm, unspecified trimester, not applicable or unspecified: Secondary | ICD-10-CM | POA: Insufficient documentation

## 2020-07-21 DIAGNOSIS — Z3A37 37 weeks gestation of pregnancy: Secondary | ICD-10-CM | POA: Insufficient documentation

## 2020-07-21 DIAGNOSIS — B9689 Other specified bacterial agents as the cause of diseases classified elsewhere: Secondary | ICD-10-CM

## 2020-07-21 MED ORDER — CLINDAMYCIN HCL 300 MG PO CAPS: 300.0000 mg | ORAL_CAPSULE | Freq: Three times a day (TID) | ORAL | 0 refills | Status: DC

## 2020-07-21 NOTE — Telephone Encounter (Signed)
S/w pt and advised of results and rx sent. 

## 2020-07-21 NOTE — Procedures (Signed)
Jade Brown [birth year] [redacted]w[redacted]d  Fetus A Non-Stress Test Interpretation for 07/21/20  Indication: Fetal tach during U/S  Fetal Heart Rate A Mode: External Baseline Rate (A): 125 bpm (Fetus very active, baseline not established at the beginning) Variability: Moderate Accelerations: 15 x 15 Decelerations: None Multiple birth?: No  Uterine Activity Mode: Palpation,Toco Contraction Frequency (min): Irreg Contraction Quality: Mild Resting Tone Palpated: Relaxed Resting Time: Adequate  Interpretation (Fetal Testing) Nonstress Test Interpretation: Reactive Comments: Dr. Parke Poisson reviewed tracing.

## 2020-07-23 ENCOUNTER — Encounter: Payer: Medicaid Other | Admitting: Nurse Practitioner

## 2020-07-29 ENCOUNTER — Encounter: Payer: Medicaid Other | Admitting: Obstetrics

## 2020-07-31 ENCOUNTER — Other Ambulatory Visit: Payer: Self-pay

## 2020-07-31 ENCOUNTER — Ambulatory Visit (INDEPENDENT_AMBULATORY_CARE_PROVIDER_SITE_OTHER): Payer: Medicaid Other | Admitting: Obstetrics

## 2020-07-31 ENCOUNTER — Encounter: Payer: Self-pay | Admitting: Obstetrics

## 2020-07-31 VITALS — BP 97/65 | HR 75 | Wt 191.0 lb

## 2020-07-31 DIAGNOSIS — O26893 Other specified pregnancy related conditions, third trimester: Secondary | ICD-10-CM | POA: Diagnosis not present

## 2020-07-31 DIAGNOSIS — B951 Streptococcus, group B, as the cause of diseases classified elsewhere: Secondary | ICD-10-CM

## 2020-07-31 DIAGNOSIS — Z3480 Encounter for supervision of other normal pregnancy, unspecified trimester: Secondary | ICD-10-CM

## 2020-07-31 DIAGNOSIS — Z6791 Unspecified blood type, Rh negative: Secondary | ICD-10-CM

## 2020-07-31 DIAGNOSIS — O0930 Supervision of pregnancy with insufficient antenatal care, unspecified trimester: Secondary | ICD-10-CM

## 2020-07-31 NOTE — Progress Notes (Signed)
Subjective:  Jade Brown is a 29 y.o. [id number] at [redacted]w[redacted]d being seen today for ongoing prenatal care.  She is currently monitored for the following issues for this low-risk pregnancy and has Rh negative status during pregnancy; Bacterial vaginosis; De Quervain's tenosynovitis, right; and Supervision of other normal pregnancy, antepartum on their problem list.  Patient reports occasional contractions.  Contractions: Regular. Vag. Bleeding: None.  Movement: Present. Denies leaking of fluid.   The following portions of the patient's history were reviewed and updated as appropriate: allergies, current medications, past family history, past medical history, past social history, past surgical history and problem list. Problem list updated.  Objective:   Vitals:   07/31/20 1453  BP: 97/65  Pulse: 75  Weight: 191 lb (86.6 kg)    Fetal Status:     Movement: Present     General:  Alert, oriented and cooperative. Patient is in no acute distress.  Skin: Skin is warm and dry. No rash noted.   Cardiovascular: Normal heart rate noted  Respiratory: Normal respiratory effort, no problems with respiration noted  Abdomen: Soft, gravid, appropriate for gestational age. Pain/Pressure: Present     Pelvic:  Cervical exam performed      2 cm / 50% / -3 / Vtx  Extremities: Normal range of motion.  Edema: Trace  Mental Status: Normal mood and affect. Normal behavior. Normal judgment and thought content.   Urinalysis:      Assessment and Plan:  Pregnancy: [id number] at [redacted]w[redacted]d  1. Supervision of other normal pregnancy, antepartum  2. Late prenatal care  3. Rh negative status during pregnancy in third trimester - Rhogam postpartum  4. Group beta Strep positive - treat in labor   Term labor symptoms and general obstetric precautions including but not limited to vaginal bleeding, contractions, leaking of fluid and fetal movement were reviewed in detail with the patient. Please refer to After Visit Summary  for other counseling recommendations.   Return in about 1 week (around 08/07/2020) for ROB.   Brock Bad, MD  07/31/20

## 2020-08-02 ENCOUNTER — Encounter (HOSPITAL_COMMUNITY): Payer: Self-pay | Admitting: Obstetrics and Gynecology

## 2020-08-02 ENCOUNTER — Inpatient Hospital Stay (HOSPITAL_COMMUNITY)
Admission: AD | Admit: 2020-08-02 | Discharge: 2020-08-02 | Disposition: A | Discharge status: Home / Self Care | Payer: Medicaid Other | Attending: Obstetrics and Gynecology | Admitting: Obstetrics and Gynecology

## 2020-08-02 ENCOUNTER — Other Ambulatory Visit: Payer: Self-pay

## 2020-08-02 DIAGNOSIS — R404 Transient alteration of awareness: Secondary | ICD-10-CM | POA: Diagnosis not present

## 2020-08-02 DIAGNOSIS — B9689 Other specified bacterial agents as the cause of diseases classified elsewhere: Secondary | ICD-10-CM

## 2020-08-02 DIAGNOSIS — O479 False labor, unspecified: Secondary | ICD-10-CM

## 2020-08-02 DIAGNOSIS — Z3480 Encounter for supervision of other normal pregnancy, unspecified trimester: Secondary | ICD-10-CM

## 2020-08-02 DIAGNOSIS — O23599 Infection of other part of genital tract in pregnancy, unspecified trimester: Secondary | ICD-10-CM | POA: Diagnosis not present

## 2020-08-02 DIAGNOSIS — Z881 Allergy status to other antibiotic agents status: Secondary | ICD-10-CM | POA: Diagnosis not present

## 2020-08-02 DIAGNOSIS — O471 False labor at or after 37 completed weeks of gestation: Secondary | ICD-10-CM | POA: Diagnosis not present

## 2020-08-02 DIAGNOSIS — Z87891 Personal history of nicotine dependence: Secondary | ICD-10-CM | POA: Insufficient documentation

## 2020-08-02 DIAGNOSIS — Z3A39 39 weeks gestation of pregnancy: Secondary | ICD-10-CM

## 2020-08-02 DIAGNOSIS — O99891 Other specified diseases and conditions complicating pregnancy: Secondary | ICD-10-CM

## 2020-08-02 DIAGNOSIS — O99323 Drug use complicating pregnancy, third trimester: Secondary | ICD-10-CM | POA: Insufficient documentation

## 2020-08-02 DIAGNOSIS — Z3A37 37 weeks gestation of pregnancy: Secondary | ICD-10-CM | POA: Diagnosis not present

## 2020-08-02 DIAGNOSIS — N760 Acute vaginitis: Secondary | ICD-10-CM

## 2020-08-02 DIAGNOSIS — F1290 Cannabis use, unspecified, uncomplicated: Secondary | ICD-10-CM | POA: Diagnosis not present

## 2020-08-02 DIAGNOSIS — Z792 Long term (current) use of antibiotics: Secondary | ICD-10-CM | POA: Insufficient documentation

## 2020-08-02 DIAGNOSIS — Z9049 Acquired absence of other specified parts of digestive tract: Secondary | ICD-10-CM | POA: Diagnosis not present

## 2020-08-02 LAB — WET PREP, GENITAL
Sperm: NONE SEEN
Trich, Wet Prep: NONE SEEN
Yeast Wet Prep HPF POC: NONE SEEN

## 2020-08-02 LAB — RAPID URINE DRUG SCREEN, HOSP PERFORMED
Amphetamines: NOT DETECTED
Barbiturates: NOT DETECTED
Benzodiazepines: NOT DETECTED
Cocaine: NOT DETECTED
Opiates: NOT DETECTED
Tetrahydrocannabinol: POSITIVE — AB

## 2020-08-02 LAB — URINALYSIS, ROUTINE W REFLEX MICROSCOPIC
Bilirubin Urine: NEGATIVE
Glucose, UA: NEGATIVE mg/dL
Hgb urine dipstick: NEGATIVE
Ketones, ur: NEGATIVE mg/dL
Leukocytes,Ua: NEGATIVE
Nitrite: NEGATIVE
Protein, ur: NEGATIVE mg/dL
Specific Gravity, Urine: 1.016 (ref 1.005–1.030)
pH: 6 (ref 5.0–8.0)

## 2020-08-02 NOTE — MAU Provider Note (Signed)
Chief Complaint:  Contractions, Altered mental status, Vaginal discomfort   Event Date/Time   First Provider Initiated Contact with Patient 08/02/20 0446     HPI: Jade Brown is a 28 y.o. [id number] at 26w1dby L/8 who presents to maternity admissions reporting concern of contractions in addition to vaginal discomfort and pressure. Of note, pt recently prescribed clindamycin for bacterial vaginosis. Per nursing, pt quite altered on admission and difficult to obtain answers to intake questions.  She reports good fetal movement, denies LOF, vaginal bleeding, vaginal itching/burning, urinary symptoms, h/a, dizziness, n/v, or fever/chills.     Past Medical History: Past Medical History:  Diagnosis Date  . Gallstones   . Medical history non-contributory     Past obstetric history: OB History  Gravida Para Term Preterm AB Living  4 2 2   1 2   SAB IAB Ectopic Multiple Live Births    1   0 2    # Outcome Date GA Lbr Len/2nd Weight Sex Delivery Anes PTL Lv  4 Current           3 Term 06/14/19 [id number] g F Vag-Spont None  LIV  2 Term 09/03/13 [id number] / 01:05 3225 g M Vag-Spont EPI  LIV  1 IAB 05/09/10 [id number]       Past Surgical History: Past Surgical History:  Procedure Laterality Date  . CHOLECYSTECTOMY N/A 12/04/2013   Procedure: LAPAROSCOPIC CHOLECYSTECTOMY;  Surgeon: ArmaRalene Ok;  [facility] [facility] [place];  Laterality: N/A;  . DILATION AND CURETTAGE OF UTERUS    . WISDOM TOOTH EXTRACTION      Family History: Family History  Problem Relation Age of Onset  . Hypertension Mother   . Diabetes Mother   . Diabetes Maternal Grandmother     Social History: Social History   Tobacco Use  . Smoking status: Former Smoker    Years: 2.00    Types: Cigars    Quit date: 01/12/2011    Years since quitting: 9.5  . Smokeless tobacco: Never Used  Vaping Use  . Vaping Use: Never used  Substance Use Topics  . Alcohol use: No  . Drug use: No    Allergies:   Allergies  Allergen Reactions  . Flagyl [Metronidazole] Nausea And Vomiting    Meds:  No medications prior to admission.    ROS:  Review of Systems  Constitutional: Negative for chills and fever.  HENT: Negative for congestion and sore throat.   Respiratory: Negative for cough and shortness of breath.   Cardiovascular: Negative for chest pain.  Gastrointestinal: Negative for abdominal pain, nausea and vomiting.  Genitourinary: Positive for vaginal pain. Negative for vaginal bleeding and vaginal discharge.  Neurological: Negative for headaches.     I have reviewed patient's Past Medical Hx, Surgical Hx, Family Hx, Social Hx, medications and allergies.   Physical Exam   Patient Vitals for the past 24 hrs:  BP Temp Temp src Pulse Resp SpO2 Height Weight  08/02/20 0408 97/62 98.1 F (36.7 C) Oral 78 -- -- -- --  08/02/20 0155 107/64 98.3 F (36.8 C) Oral 90 18 98 % -- --  08/02/20 0141 (!) 118/57 98.1 F (36.7 C) Oral 90 17 98 % 5' 2"  (1.575 m) 85.3 kg   Constitutional: Well-developed, well-nourished female in no acute distress. Cardiovascular: normal rate Respiratory: normal effort GI: Abd soft, non-tender, gravid appropriate for gestational age.  MS: Extremities nontender, no edema, normal ROM Neurologic: Alert  and oriented x 4.  Psych: minimal eye contact. Delayed responses to questions. Intermittent slurring of speech. GU: per nursing; cervical exam as noted below  Dilation: 2 Effacement (%): Thick Cervical Position: Posterior Station: -3 Exam by:: Morton Peters, RNC  FHT:  Baseline 150, moderate variability, accelerations present, no decelerations Contractions: uterine irritability on toco   Labs: Results for orders placed or performed during the hospital encounter of 08/02/20 (from the past 24 hour(s))  Wet prep, genital     Status: Abnormal   Collection Time: 08/02/20  2:27 AM  Result Value Ref Range   Yeast Wet Prep HPF POC NONE SEEN NONE SEEN    Trich, Wet Prep NONE SEEN NONE SEEN   Clue Cells Wet Prep HPF POC PRESENT (A) NONE SEEN   WBC, Wet Prep HPF POC MANY (A) NONE SEEN   Sperm NONE SEEN   Urine rapid drug screen (hosp performed)     Status: Abnormal   Collection Time: 08/02/20  2:28 AM  Result Value Ref Range   Opiates NONE DETECTED NONE DETECTED   Cocaine NONE DETECTED NONE DETECTED   Benzodiazepines NONE DETECTED NONE DETECTED   Amphetamines NONE DETECTED NONE DETECTED   Tetrahydrocannabinol POSITIVE (A) NONE DETECTED   Barbiturates NONE DETECTED NONE DETECTED  Urinalysis, Routine w reflex microscopic     Status: Abnormal   Collection Time: 08/02/20  2:28 AM  Result Value Ref Range   Color, Urine YELLOW YELLOW   APPearance HAZY (A) CLEAR   Specific Gravity, Urine 1.016 1.005 - 1.030   pH 6.0 5.0 - 8.0   Glucose, UA NEGATIVE NEGATIVE mg/dL   Hgb urine dipstick NEGATIVE NEGATIVE   Bilirubin Urine NEGATIVE NEGATIVE   Ketones, ur NEGATIVE NEGATIVE mg/dL   Protein, ur NEGATIVE NEGATIVE mg/dL   Nitrite NEGATIVE NEGATIVE   Leukocytes,Ua NEGATIVE NEGATIVE   A/Negative/-- (12/09 0931)  Imaging:  [place] MFM OB DETAIL +14 WK  Result Date: 07/21/2020 ----------------------------------------------------------------------  OBSTETRICS REPORT                       (Signed Final 07/21/2020 03:26 pm) ---------------------------------------------------------------------- Patient Info  ID #:       [id number]                          D.O.B.:  1991/07/08 (29 yrs)  Name:       Jade Brown                   Visit Date: 07/21/2020 01:50 pm ---------------------------------------------------------------------- Performed By  Attending:        Johnell Comings MD         Ref. Address:     [place], [place]  [place]  Performed By:     Berlinda Last           [facility]  Referred By:      Shelly Bombard MD ---------------------------------------------------------------------- Orders  #  Description                           Code        Ordered By  1  [place] MFM OB DETAIL +14 [place]               76811.01    Baltazar Najjar ----------------------------------------------------------------------  #  Order #                     Accession #                Episode #  [id number]  [id number]                   [id number]                 [id number] ---------------------------------------------------------------------- Indications  Encounter for antenatal screening for          Z36.3  malformations  [redacted] weeks gestation of pregnancy                Z3A.37  Late to prenatal care, third trimester         O09.33 ---------------------------------------------------------------------- Fetal Evaluation  Num Of Fetuses:         1  Fetal Heart Rate(bpm):  192  Cardiac Activity:       Observed  Presentation:           Cephalic  Placenta:               Posterior  P. Cord Insertion:      Not well visualized  Amniotic Fluid  AFI FV:      Within normal limits  AFI Sum(cm)     %Tile       Largest Pocket(cm)  9.3             19          2.7  RUQ(cm)       RLQ(cm)  LUQ(cm)        LLQ(cm)  2.6           2.7           1.7            2.3 ---------------------------------------------------------------------- Biophysical Evaluation  Amniotic F.V:   Within normal limits       F. Tone:        Observed  F. Movement:    Observed                   Score:          8/8  F. Breathing:   Observed ---------------------------------------------------------------------- Biometry  BPD:      86.9  mm     G. Age:  35w 1d         10  %    CI:         73.6   %    70 - 86                                                           FL/HC:      20.2   %    20.8 - 22.6  HC:      321.8  mm     G. Age:  36w 2d          9  %    HC/AC:      1.00        0.92 - 1.05  AC:      322.3  mm     G. Age:  36w 1d         29  %    FL/BPD:     74.9   %    71 - 87  FL:       65.1  mm     G. Age:  33w 4d        < 1  %    FL/AC:      20.2   %    20 - 24  HUM:      56.9  mm     G. Age:  33w 0d        < 5  %  Est. FW:    2666  gm    5 lb 14 oz      13  % ---------------------------------------------------------------------- OB History  Gravidity:    4         Term:   2        Prem:   0        SAB:   0  TOP:          1       Ectopic:  0        Living: 2 ---------------------------------------------------------------------- Gestational Age  LMP:           37w 3d        Date:  11/02/19                 EDD:   08/08/20  U/S Today:     35w 2d  EDD:   08/23/20  Best:          37w 3d     Det. By:  LMP  (11/02/19)          EDD:   08/08/20 ---------------------------------------------------------------------- Anatomy  Cranium:               Appears normal         Aortic Arch:            Not well visualized  Cavum:                 Appears normal         Ductal Arch:            Not well visualized  Ventricles:            Appears normal         Diaphragm:              Appears normal  Choroid Plexus:        Not well visualized    Stomach:                Appears normal, left                                                                        sided  Cerebellum:            Not well visualized    Abdomen:                Appears normal  Posterior Fossa:       Not well visualized    Abdominal Wall:         Not well visualized  Nuchal Fold:           Not applicable (>54    Cord Vessels:           Appears normal ([redacted]                         wks GA)                                        vessel cord)  Face:                  Orbits nl; profile not Kidneys:                Appear normal                          well visualized  Lips:                  Not well visualized    Bladder:                Appears normal  Thoracic:              Appears normal         Spine:                  Appears normal  Heart:  Not well visualized    Upper Extremities:      Not well visualized  RVOT:                  Appears normal         Lower Extremities:      Visualized  LVOT:                  Appears normal  Other:  Technically difficult due to advanced GA and fetal position. ---------------------------------------------------------------------- Cervix Uterus Adnexa  Cervix  Not visualized (advanced GA >24wks) ---------------------------------------------------------------------- Comments  This patient was seen for a detailed fetal anatomy scan as  she presented late for prenatal care.  She denies any significant past medical history and denies  any problems in her current pregnancy.  She was informed that the fetal growth and amniotic fluid  level were appropriate for her gestational age.  The views of the fetal anatomy were limited today due to her  advanced gestational age (51 weeks).  The patient was informed that anomalies may be missed due  to technical limitations. If the fetus is in a suboptimal position  or maternal habitus is increased, visualization of the fetus in  the maternal uterus may be impaired.  During the performance of her ultrasound exam, possible  fetal tachycardia with a heart rate in the 170s to 180s was  noted.  Vigorous fetal movements were noted during this time.  Due to the possible fetal tachycardia noted on her ultrasound  exam, the patient had a nonstress test performed following  her ultrasound exam showing a baseline that was in the 140s  to 150s range with accelerations up to the 170s to 180s  range.  The NST was reactive overall.  Fetal kick count instructions were reviewed with the patient.  No further exams were scheduled in our office.  ----------------------------------------------------------------------                   Johnell Comings, MD Electronically Signed Final Report   07/21/2020 03:26 pm ----------------------------------------------------------------------   MAU Course/MDM: Orders Placed This Encounter  Procedures  . Wet prep, genital  . Urine rapid drug screen (hosp performed)  . Urinalysis, Routine w reflex microscopic Urine, Clean Catch  . Discharge patient Discharge disposition: 01-Home or Self Care; Discharge patient date: 08/02/2020    No orders of the defined types were placed in this encounter.    NST reviewed and reactive Treatments in MAU included none.   Pt discharge with strict return precautions for signs/symptoms of labor, vaginal bleeding, decreased fetal movement and other concerns.  Assessment: 1. False labor:  Pt presenting with complaint of contractions x1 day. No cervical change observed s/p 1 hour of observation. Uterine irritability on toco but no regular contraction pattern. No red flag symptoms. -counseled on importance of good hydration and tylenol as needed for discomfort -plan for IOL on 08/16/19 as previously scheduled -provided return precautions as noted above  2. Supervision of other normal pregnancy, antepartum   3. Bacterial vaginosis: Pt endorsing vaginal discomfort on arrival to MAU. Of note pt recently prescribed clindamycin for bacterial vaginosis. UA in MAU unremarkable. Wet prep shows BV and GC/C pending on discharge. -instructed continued use of clindamycin as previously prescribed  4. Transient alteration of awareness: Per nursing, pt notably "altered" on arrival. Difficulty obtaining intake questions. UDS collected and positive for THC. -encouraged complete cessation of substances and informed pt of substance screening for all neonates at time of birth  on L&D   Plan: Discharge home with return precautions as noted above. Post-dates IOL scheduled 08/16/19. Labor  precautions and fetal kick counts  Allergies as of 08/02/2020      Reactions   Flagyl [metronidazole] Nausea And Vomiting      Medication List    STOP taking these medications   Prenate Mini 29-0.6-0.4-350 MG Caps     TAKE these medications   acetaminophen 325 MG tablet Commonly known as: Tylenol Take 2 tablets (650 mg total) by mouth every 4 (four) hours as needed (for pain scale < 4).   Blood Pressure Kit Devi 1 Device by Does not apply route as needed.   clindamycin 300 MG capsule Commonly known as: Cleocin Take 1 capsule (300 mg total) by mouth 3 (three) times daily.   Doxylamine-Pyridoxine 10-10 MG Tbec Commonly known as: Diclegis Take 2 tablets by mouth at bedtime. Two tablets at bedtime on day 1 and 2; if symptoms persist, take 1 tablet in morning and 2 tablets at bedtime on day 3; if symptoms persist, may increase to 1 tablet in morning, 1 tablet mid-afternoon, and 2 tablets at bedtime on day 4 (maximum: doxylamine 40 mg/pyridoxine 40 mg (4 tablets) per day).   Gojji Weight Scale Misc 1 Device by Does not apply route as needed.   multivitamin-prenatal 27-0.8 MG Tabs tablet Take 1 tablet by mouth daily at 12 noon.      Randa Ngo, MD OB Fellow, [facility] 08/02/2020 5:01 AM

## 2020-08-02 NOTE — MAU Note (Signed)
Pt reports contractions all day, pressure, SVE yesterday 3cm. Reports good fetal movement

## 2020-08-02 NOTE — Discharge Instructions (Signed)
Braxton Hicks Contractions Contractions of the uterus can occur throughout pregnancy, but they are not always a sign that you are in labor. You may have practice contractions called Braxton Hicks contractions. These false labor contractions are sometimes confused with true labor. What are Braxton Hicks contractions? Braxton Hicks contractions are tightening movements that occur in the muscles of the uterus before labor. Unlike true labor contractions, these contractions do not result in opening (dilation) and thinning of the cervix. Toward the end of pregnancy (32-34 weeks), Braxton Hicks contractions can happen more often and may become stronger. These contractions are sometimes difficult to tell apart from true labor because they can be very uncomfortable. You should not feel embarrassed if you go to the hospital with false labor. Sometimes, the only way to tell if you are in true labor is for your health care provider to look for changes in the cervix. The health care provider will do a physical exam and may monitor your contractions. If you are not in true labor, the exam should show that your cervix is not dilating and your water has not broken. If there are no other health problems associated with your pregnancy, it is completely safe for you to be sent home with false labor. You may continue to have Braxton Hicks contractions until you go into true labor. How to tell the difference between true labor and false labor True labor  Contractions last 30-70 seconds.  Contractions become very regular.  Discomfort is usually felt in the top of the uterus, and it spreads to the lower abdomen and low back.  Contractions do not go away with walking.  Contractions usually become more intense and increase in frequency.  The cervix dilates and gets thinner. False labor  Contractions are usually shorter and not as strong as true labor contractions.  Contractions are usually irregular.  Contractions  are often felt in the front of the lower abdomen and in the groin.  Contractions may go away when you walk around or change positions while lying down.  Contractions get weaker and are shorter-lasting as time goes on.  The cervix usually does not dilate or become thin. Follow these instructions at home:   Take over-the-counter and prescription medicines only as told by your health care provider.  Keep up with your usual exercises and follow other instructions from your health care provider.  Eat and drink lightly if you think you are going into labor.  If Braxton Hicks contractions are making you uncomfortable: ? Change your position from lying down or resting to walking, or change from walking to resting. ? Sit and rest in a tub of warm water. ? Drink enough fluid to keep your urine pale yellow. Dehydration may cause these contractions. ? Do slow and deep breathing several times an hour.  Keep all follow-up prenatal visits as told by your health care provider. This is important. Contact a health care provider if:  You have a fever.  You have continuous pain in your abdomen. Get help right away if:  Your contractions become stronger, more regular, and closer together.  You have fluid leaking or gushing from your vagina.  You pass blood-tinged mucus (bloody show).  You have bleeding from your vagina.  You have low back pain that you never had before.  You feel your baby's head pushing down and causing pelvic pressure.  Your baby is not moving inside you as much as it used to. Summary  Contractions that occur before labor are   called Braxton Hicks contractions, false labor, or practice contractions.  Braxton Hicks contractions are usually shorter, weaker, farther apart, and less regular than true labor contractions. True labor contractions usually become progressively stronger and regular, and they become more frequent.  Manage discomfort from Braxton Hicks contractions  by changing position, resting in a warm bath, drinking plenty of water, or practicing deep breathing. This information is not intended to replace advice given to you by your health care provider. Make sure you discuss any questions you have with your health care provider. Document Revised: 07/08/2017 Document Reviewed: 12/09/2016 Elsevier Patient Education  2020 [employer] Fetal Movement Counts Patient Name: ________________________________________________ Patient Due Date: ____________________ What is a fetal movement count?  A fetal movement count is the number of times that you feel your baby move during a certain amount of time. This may also be called a fetal kick count. A fetal movement count is recommended for every pregnant woman. You may be asked to start counting fetal movements as early as week 28 of your pregnancy. Pay attention to when your baby is most active. You may notice your baby's sleep and wake cycles. You may also notice things that make your baby move more. You should do a fetal movement count:  When your baby is normally most active.  At the same time each day. A good time to count movements is while you are resting, after having something to eat and drink. How do I count fetal movements? 1. Find a quiet, comfortable area. Sit, or lie down on your side. 2. Write down the date, the start time and stop time, and the number of movements that you felt between those two times. Take this information with you to your health care visits. 3. Write down your start time when you feel the first movement. 4. Count kicks, flutters, swishes, rolls, and jabs. You should feel at least 10 movements. 5. You may stop counting after you have felt 10 movements, or if you have been counting for 2 hours. Write down the stop time. 6. If you do not feel 10 movements in 2 hours, contact your health care provider for further instructions. Your health care provider may want to do additional tests  to assess your baby's well-being. Contact a health care provider if:  You feel fewer than 10 movements in 2 hours.  Your baby is not moving like he or she usually does. Date: ____________ Start time: ____________ Stop time: ____________ Movements: ____________ Date: ____________ Start time: ____________ Stop time: ____________ Movements: ____________ Date: ____________ Start time: ____________ Stop time: ____________ Movements: ____________ Date: ____________ Start time: ____________ Stop time: ____________ Movements: ____________ Date: ____________ Start time: ____________ Stop time: ____________ Movements: ____________ Date: ____________ Start time: ____________ Stop time: ____________ Movements: ____________ Date: ____________ Start time: ____________ Stop time: ____________ Movements: ____________ Date: ____________ Start time: ____________ Stop time: ____________ Movements: ____________ Date: ____________ Start time: ____________ Stop time: ____________ Movements: ____________ This information is not intended to replace advice given to you by your health care provider. Make sure you discuss any questions you have with your health care provider. Document Revised: 03/15/2019 Document Reviewed: 03/15/2019 Elsevier Patient Education  2020 [employer] Inc[employer]  

## 2020-08-04 ENCOUNTER — Encounter (HOSPITAL_COMMUNITY): Payer: Self-pay | Admitting: *Deleted

## 2020-08-04 ENCOUNTER — Telehealth (HOSPITAL_COMMUNITY): Payer: Self-pay | Admitting: *Deleted

## 2020-08-04 LAB — GC/CHLAMYDIA PROBE AMP (~~LOC~~) NOT AT ARMC
Chlamydia: NEGATIVE
Comment: NEGATIVE
Comment: NORMAL
Neisseria Gonorrhea: NEGATIVE

## 2020-08-04 NOTE — Telephone Encounter (Signed)
Preadmission screen  

## 2020-08-08 ENCOUNTER — Other Ambulatory Visit: Payer: Self-pay

## 2020-08-08 ENCOUNTER — Inpatient Hospital Stay (HOSPITAL_COMMUNITY)
Admission: RE | Admit: 2020-08-08 | Discharge: 2020-08-10 | DRG: 807 | Disposition: A | Discharge status: Home / Self Care | Payer: Medicaid Other | Attending: Obstetrics and Gynecology | Admitting: Obstetrics and Gynecology

## 2020-08-08 DIAGNOSIS — Z3A40 40 weeks gestation of pregnancy: Secondary | ICD-10-CM

## 2020-08-08 DIAGNOSIS — O4202 Full-term premature rupture of membranes, onset of labor within 24 hours of rupture: Secondary | ICD-10-CM | POA: Diagnosis not present

## 2020-08-08 DIAGNOSIS — Z87891 Personal history of nicotine dependence: Secondary | ICD-10-CM | POA: Diagnosis not present

## 2020-08-08 DIAGNOSIS — O43123 Velamentous insertion of umbilical cord, third trimester: Secondary | ICD-10-CM | POA: Diagnosis present

## 2020-08-08 DIAGNOSIS — Z20822 Contact with and (suspected) exposure to covid-19: Secondary | ICD-10-CM | POA: Diagnosis present

## 2020-08-08 DIAGNOSIS — O26893 Other specified pregnancy related conditions, third trimester: Secondary | ICD-10-CM | POA: Diagnosis present

## 2020-08-08 LAB — CBC
HCT: 37.9 % (ref 36.0–46.0)
Hemoglobin: 12.5 g/dL (ref 12.0–15.0)
MCH: 30.7 pg (ref 26.0–34.0)
MCHC: 33 g/dL (ref 30.0–36.0)
MCV: 93.1 fL (ref 80.0–100.0)
Platelets: 271 10*3/uL (ref 150–400)
RBC: 4.07 MIL/uL (ref 3.87–5.11)
RDW: 13.8 % (ref 11.5–15.5)
WBC: 13.4 10*3/uL — ABNORMAL HIGH (ref 4.0–10.5)
nRBC: 0 % (ref 0.0–0.2)

## 2020-08-08 LAB — RESP PANEL BY RT-PCR (FLU A&B, COVID) ARPGX2
Influenza A by PCR: NEGATIVE
Influenza B by PCR: NEGATIVE
SARS Coronavirus 2 by RT PCR: NEGATIVE

## 2020-08-08 LAB — TYPE AND SCREEN
ABO/RH(D): A NEG
Antibody Screen: POSITIVE

## 2020-08-08 MED ORDER — OXYTOCIN BOLUS FROM INFUSION
333.0000 mL | Freq: Once | INTRAVENOUS | Status: AC
  Administered 2020-08-08: 333 mL via INTRAVENOUS

## 2020-08-08 MED ORDER — OXYCODONE-ACETAMINOPHEN 5-325 MG PO TABS: 2.0000 | ORAL_TABLET | ORAL | Status: DC | PRN

## 2020-08-08 MED ORDER — TERBUTALINE SULFATE 1 MG/ML IJ SOLN: 0.2500 mg | Freq: Once | INTRAMUSCULAR | Status: DC | PRN

## 2020-08-08 MED ORDER — OXYCODONE-ACETAMINOPHEN 5-325 MG PO TABS: 1.0000 | ORAL_TABLET | ORAL | Status: DC | PRN

## 2020-08-08 MED ORDER — FENTANYL CITRATE (PF) 100 MCG/2ML IJ SOLN
100.0000 ug | INTRAMUSCULAR | Status: DC | PRN
  Administered 2020-08-08: 100 ug via INTRAVENOUS
  Filled 2020-08-08: qty 2

## 2020-08-08 MED ORDER — LACTATED RINGERS IV SOLN: 500.0000 mL | INTRAVENOUS | Status: DC | PRN

## 2020-08-08 MED ORDER — LACTATED RINGERS IV SOLN: INTRAVENOUS | Status: DC

## 2020-08-08 MED ORDER — OXYTOCIN-SODIUM CHLORIDE 30-0.9 UT/500ML-% IV SOLN
2.5000 [IU]/h | INTRAVENOUS | Status: DC
  Filled 2020-08-08: qty 500

## 2020-08-08 MED ORDER — HYDROXYZINE HCL 50 MG PO TABS: 50.0000 mg | ORAL_TABLET | Freq: Four times a day (QID) | ORAL | Status: DC | PRN

## 2020-08-08 MED ORDER — FLEET ENEMA 7-19 GM/118ML RE ENEM: 1.0000 | ENEMA | RECTAL | Status: DC | PRN

## 2020-08-08 MED ORDER — SOD CITRATE-CITRIC ACID 500-334 MG/5ML PO SOLN: 30.0000 mL | ORAL | Status: DC | PRN

## 2020-08-08 MED ORDER — MISOPROSTOL 25 MCG QUARTER TABLET: 25.0000 ug | ORAL_TABLET | ORAL | Status: DC | PRN

## 2020-08-08 MED ORDER — ACETAMINOPHEN 325 MG PO TABS: 650.0000 mg | ORAL_TABLET | ORAL | Status: DC | PRN

## 2020-08-08 MED ORDER — ONDANSETRON HCL 4 MG/2ML IJ SOLN: 4.0000 mg | Freq: Four times a day (QID) | INTRAMUSCULAR | Status: DC | PRN

## 2020-08-08 MED ORDER — LIDOCAINE HCL (PF) 1 % IJ SOLN: 30.0000 mL | INTRAMUSCULAR | Status: DC | PRN

## 2020-08-08 NOTE — H&P (Signed)
Jade Brown is a 29 y.o. [phone] female at 3w0dby LMP c/w 8wk u/s, presenting in early active labor.   Reports active fetal movement, contractions: regular, vaginal bleeding: none, membranes: intact.  [facility]   Most recent u/s 12/13 @ 37.3wks, EFW 13%.   This pregnancy complicated by: Late prenatal care  Prenatal History/Complications:  Uncomplicated SVB x 2, last birth 06/14/19- was 4.5/70/-3 in MAU and delivered quickly w/o augmentation IAB x 1  Past Medical History: Past Medical History:  Diagnosis Date   Gallstones    Medical history non-contributory     Past Surgical History: Past Surgical History:  Procedure Laterality Date   CHOLECYSTECTOMY N/A 12/04/2013   Procedure: LAPAROSCOPIC CHOLECYSTECTOMY;  Surgeon: ARalene Ok MD;  [place]  Service: General;  Laterality: N/A;   DILATION AND CURETTAGE OF UTERUS     WISDOM TOOTH EXTRACTION      Obstetrical History: OB History    Gravida  4   Para  2   Term  2   Preterm      AB  1   Living  2     SAB      IAB  1   Ectopic      Multiple  0   Live Births  2           Social History: Social History   Socioeconomic History   Marital status: Single    Spouse name: Not on file   Number of children: Not on file   Years of education: Not on file   Highest education level: Not on file  Occupational History   Not on file  Tobacco Use   Smoking status: Former Smoker    Years: 2.00    Types: Cigars    Quit date: 01/12/2011    Years since quitting: 9.5   Smokeless tobacco: Never Used  Vaping Use   Vaping Use: Never used  Substance and Sexual Activity   Alcohol use: No   Drug use: No   Sexual activity: Yes    Partners: Male    Birth control/protection: None  Other Topics Concern   Not on file  Social History Narrative   Not on file   Social Determinants of Health   Financial Resource Strain: Not on file  Food Insecurity: Not  on file  Transportation Needs: Not on file  Physical Activity: Not on file  Stress: Not on file  Social Connections: Not on file    Family History: Family History  Problem Relation Age of Onset   Hypertension Mother    Diabetes Mother    Diabetes Maternal Grandmother     Allergies: Allergies  Allergen Reactions   Flagyl [Metronidazole] Nausea And Vomiting    Medications Prior to Admission  Medication Sig Dispense Refill Last Dose   clindamycin (CLEOCIN) 300 MG capsule Take 1 capsule (300 mg total) by mouth 3 (three) times daily. 21 capsule 0 08/07/2020 at Unknown time   Prenatal Vit-Fe Fumarate-FA (MULTIVITAMIN-PRENATAL) 27-0.8 MG TABS tablet Take 1 tablet by mouth daily at 12 noon.   08/08/2020 at Unknown time   acetaminophen (TYLENOL) 325 MG tablet Take 2 tablets (650 mg total) by mouth every 4 (four) hours as needed (for pain scale < 4). (Patient not taking: No sig reported)   Unknown at Unknown time   Blood Pressure Monitoring (BLOOD PRESSURE KIT) DEVI 1 Device by Does not apply route as needed. 1 each 0 Unknown  at Unknown time   Doxylamine-Pyridoxine (DICLEGIS) 10-10 MG TBEC Take 2 tablets by mouth at bedtime. Two tablets at bedtime on day 1 and 2; if symptoms persist, take 1 tablet in morning and 2 tablets at bedtime on day 3; if symptoms persist, may increase to 1 tablet in morning, 1 tablet mid-afternoon, and 2 tablets at bedtime on day 4 (maximum: doxylamine 40 mg/pyridoxine 40 mg (4 tablets) per day). (Patient not taking: No sig reported) 60 tablet 1    Misc. Devices (GOJJI WEIGHT SCALE) MISC 1 Device by Does not apply route as needed. 1 each 0     Review of Systems  Pertinent pos/neg as indicated in HPI  Blood pressure 113/78, pulse 91, temperature 97.9 F (36.6 C), temperature source Oral, resp. rate 18, weight 85.1 kg, last menstrual period 11/02/2019, SpO2 100 %, currently breastfeeding. General appearance: alert, cooperative and no distress Lungs: clear  to auscultation bilaterally Heart: regular rate and rhythm Abdomen: gravid, soft, non-tender Extremities: no edema DTR's 2+  Fetal monitoring: FHR: 125 bpm, variability: moderate,  Accelerations: Present,  decelerations:  Absent Uterine activity: regular Dilation: 5 Effacement (%): 80 Station: -2 Exam by:: J.Bellamy,RN Presentation: cephalic   Prenatal labs: ABO, Rh: A/Negative/-- (12/09 0931) Antibody: Negative (12/09 0931) Rubella: 1.18 (12/09 0931) RPR: Non Reactive (12/09 0931)  HBsAg: Negative (12/09 0931)  HIV: Non Reactive (12/09 0931)  GBS: Negative/-- (12/09 0857)  A1C on 07/17/20: 5.6  No results found for this or any previous visit (from the past 24 hour(s)).   Assessment:  30w0dSIUP  [id number] Early active labor w/ h/o rapid labor  Cat 1 FHR  GBS Negative/-- (12/09 09728  Plan:  Admit to L&D  IV pain meds/epidural prn active labor  Expectant mangement  Anticipate NSVB   Plans to breastfeed  Contraception: undecided, thinking about interval BTL  [place]: [place] WHNP-BC 08/08/2020, 8:03 PM

## 2020-08-08 NOTE — MAU Note (Addendum)
Pt reports cx's that started this afternoon but picked up to 7-8 minutes since around 6pm.  No leaking of fluid.  +FM

## 2020-08-09 ENCOUNTER — Encounter (HOSPITAL_COMMUNITY): Payer: Self-pay | Admitting: Obstetrics and Gynecology

## 2020-08-09 LAB — RPR: RPR Ser Ql: NONREACTIVE

## 2020-08-09 MED ORDER — ZOLPIDEM TARTRATE 5 MG PO TABS: 5.0000 mg | ORAL_TABLET | Freq: Every evening | ORAL | Status: DC | PRN

## 2020-08-09 MED ORDER — SODIUM CHLORIDE 0.9% FLUSH: 3.0000 mL | Freq: Two times a day (BID) | INTRAVENOUS | Status: DC

## 2020-08-09 MED ORDER — TETANUS-DIPHTH-ACELL PERTUSSIS 5-2.5-18.5 LF-MCG/0.5 IM SUSY: 0.5000 mL | PREFILLED_SYRINGE | Freq: Once | INTRAMUSCULAR | Status: DC

## 2020-08-09 MED ORDER — SIMETHICONE 80 MG PO CHEW: 80.0000 mg | CHEWABLE_TABLET | ORAL | Status: DC | PRN

## 2020-08-09 MED ORDER — COCONUT OIL OIL
1.0000 "application " | TOPICAL_OIL | Status: DC | PRN
  Administered 2020-08-10: 1 via TOPICAL

## 2020-08-09 MED ORDER — WITCH HAZEL-GLYCERIN EX PADS: 1.0000 "application " | MEDICATED_PAD | CUTANEOUS | Status: DC | PRN

## 2020-08-09 MED ORDER — DIBUCAINE (PERIANAL) 1 % EX OINT: 1.0000 "application " | TOPICAL_OINTMENT | CUTANEOUS | Status: DC | PRN

## 2020-08-09 MED ORDER — PRENATAL MULTIVITAMIN CH
1.0000 | ORAL_TABLET | Freq: Every day | ORAL | Status: DC
  Administered 2020-08-09 – 2020-08-10 (×2): 1 via ORAL
  Filled 2020-08-09 (×2): qty 1

## 2020-08-09 MED ORDER — ONDANSETRON HCL 4 MG PO TABS: 4.0000 mg | ORAL_TABLET | ORAL | Status: DC | PRN

## 2020-08-09 MED ORDER — FLEET ENEMA 7-19 GM/118ML RE ENEM: 1.0000 | ENEMA | Freq: Every day | RECTAL | Status: DC | PRN

## 2020-08-09 MED ORDER — SENNOSIDES-DOCUSATE SODIUM 8.6-50 MG PO TABS
2.0000 | ORAL_TABLET | Freq: Every day | ORAL | Status: DC
  Administered 2020-08-10: 2 via ORAL
  Filled 2020-08-09: qty 2

## 2020-08-09 MED ORDER — ONDANSETRON HCL 4 MG/2ML IJ SOLN
4.0000 mg | INTRAMUSCULAR | Status: DC | PRN
  Administered 2020-08-09: 4 mg via INTRAVENOUS
  Filled 2020-08-09: qty 2

## 2020-08-09 MED ORDER — BENZOCAINE-MENTHOL 20-0.5 % EX AERO
1.0000 "application " | INHALATION_SPRAY | CUTANEOUS | Status: DC | PRN
  Administered 2020-08-09: 1 via TOPICAL
  Filled 2020-08-09: qty 56

## 2020-08-09 MED ORDER — IBUPROFEN 600 MG PO TABS
600.0000 mg | ORAL_TABLET | Freq: Four times a day (QID) | ORAL | Status: DC
  Administered 2020-08-09 – 2020-08-10 (×7): 600 mg via ORAL
  Filled 2020-08-09 (×7): qty 1

## 2020-08-09 MED ORDER — ACETAMINOPHEN 325 MG PO TABS
650.0000 mg | ORAL_TABLET | ORAL | Status: DC | PRN
  Administered 2020-08-09: 650 mg via ORAL
  Filled 2020-08-09: qty 2

## 2020-08-09 MED ORDER — SODIUM CHLORIDE 0.9 % IV SOLN: 250.0000 mL | INTRAVENOUS | Status: DC | PRN

## 2020-08-09 MED ORDER — BISACODYL 10 MG RE SUPP: 10.0000 mg | Freq: Every day | RECTAL | Status: DC | PRN

## 2020-08-09 MED ORDER — DIPHENHYDRAMINE HCL 25 MG PO CAPS: 25.0000 mg | ORAL_CAPSULE | Freq: Four times a day (QID) | ORAL | Status: DC | PRN

## 2020-08-09 MED ORDER — MEASLES, MUMPS & RUBELLA VAC IJ SOLR: 0.5000 mL | Freq: Once | INTRAMUSCULAR | Status: DC

## 2020-08-09 MED ORDER — SODIUM CHLORIDE 0.9% FLUSH: 3.0000 mL | INTRAVENOUS | Status: DC | PRN

## 2020-08-09 NOTE — Lactation Note (Signed)
This note was copied from a baby's chart. Lactation Consultation Note  Patient Name: Jade Brown Date: 08/09/2020   Age:29 hours  LC informed by RN, Marissa Mabe, that Mom declined LC.

## 2020-08-09 NOTE — Discharge Summary (Signed)
Postpartum Discharge Summary   Patient Name: Jade Brown DOB: [birth year] MRN: [id number]  Date of admission: 08/08/2020 Delivery date:08/08/2020  Delivering provider: Wells Guiles R  Date of discharge: 08/10/2020  Admitting diagnosis: Normal labor [O80, Z37.9] Intrauterine pregnancy: [redacted]w[redacted]d    Secondary diagnosis:  Active Problems:   Normal labor  Additional problems: none    Discharge diagnosis: Term Pregnancy Delivered                                              Post partum procedures:Rhogam  Evaluation (mom A negative, baby also A negative) Augmentation: N/A Complications: None  Hospital course: Onset of Labor With Vaginal Delivery      30y.o. yo [id number] 470w1das admitted in Active Labor on 08/08/2020. Patient had an uncomplicated labor course as follows:  Membrane Rupture Time/Date: 11:05 PM ,08/08/2020   Delivery Method:Vaginal, Spontaneous  Episiotomy: None  Lacerations:  None  Patient had an uncomplicated postpartum course.  She is ambulating, tolerating a regular diet, passing flatus, and urinating well. Patient is discharged home in stable condition on 08/10/20.  Newborn Data: Birth date:08/08/2020  Birth time:11:07 PM  Gender:Female  Living status:Living  Apgars:9 ,9  Weight:   Magnesium Sulfate received: No BMZ received: No Rhophylac:No  MMR:N/A T-DaP:Ordered Flu: No Transfusion:No  Physical exam  Vitals:   08/08/20 2136 08/08/20 2334 08/08/20 2349 08/09/20 0007  BP:  114/66 122/70 111/73  Pulse:  67 (!) 57 75  Resp:   16 15  Temp:      TempSrc:      SpO2:      Weight: 84.8 kg     Height: _0  (1.575 m)      General: alert, cooperative and no distress Lochia: appropriate Uterine Fundus: firm Incision: N/A DVT Evaluation: No evidence of DVT seen on physical exam. Labs: Lab Results  Component Value Date   WBC 13.4 (H) 08/08/2020   HGB 12.5 08/08/2020   HCT 37.9 08/08/2020   MCV 93.1 08/08/2020   PLT 271 08/08/2020   CMP  Latest Ref Rng & Units 01/04/2020  Glucose 70 - 99 mg/dL 93  BUN 6 - 20 mg/dL 6  Creatinine 0.44 - 1.00 mg/dL 0.74  Sodium 135 - 145 mmol/L 137  Potassium 3.5 - 5.1 mmol/L 3.9  Chloride 98 - 111 mmol/L 105  CO2 22 - 32 mmol/L 23  Calcium 8.9 - 10.3 mg/dL 9.0  Total Protein 6.5 - 8.1 g/dL 6.4(L)  Total Bilirubin 0.3 - 1.2 mg/dL 0.6  Alkaline Phos 38 - 126 U/L 69  AST 15 - 41 U/L 17  ALT 0 - 44 U/L 13   Edinburgh Score: Edinburgh Postnatal Depression Scale Screening Tool 29/02/2019  I have been able to laugh and see the funny side of things. 0  I have looked forward with enjoyment to things. 2  I have blamed myself unnecessarily when things went wrong. 1  I have been anxious or worried for no good reason. 2  I have felt scared or panicky for no good reason. 0  Things have been getting on top of me. 1  I have been so unhappy that I have had difficulty sleeping. 0  I have felt sad or miserable. 1  I have been so unhappy that I have been crying. 1  The thought of harming myself has  occurred to me. 0  Edinburgh Postnatal Depression Scale Total 8     After visit meds:  Allergies as of 08/10/2020      Reactions   Flagyl [metronidazole] Nausea And Vomiting      Medication List    STOP taking these medications   clindamycin 300 MG capsule Commonly known as: Cleocin   Doxylamine-Pyridoxine 10-10 MG Tbec Commonly known as: Diclegis   Gojji Weight Scale Misc     TAKE these medications   acetaminophen 325 MG tablet Commonly known as: Tylenol Take 2 tablets (650 mg total) by mouth every 4 (four) hours as needed (for pain scale < 4).   bisacodyl 10 MG suppository Commonly known as: DULCOLAX Place 1 suppository (10 mg total) rectally daily as needed for moderate constipation.   Blood Pressure Kit Devi 1 Device by Does not apply route as needed.   ibuprofen 600 MG tablet Commonly known as: ADVIL Take 1 tablet (600 mg total) by mouth every 6 (six) hours.    multivitamin-prenatal 27-0.8 MG Tabs tablet Take 1 tablet by mouth daily at 12 noon.        Discharge home in stable condition Infant Feeding: Breast Infant Disposition:home with mother Discharge instruction: per After Visit Summary and Postpartum booklet. Activity: Advance as tolerated. Pelvic rest for 6 weeks.  Diet: routine diet Future Appointments: Future Appointments  Date Time Provider [place]  08/13/2020  9:30 AM MC-SCREENING MC-SDSC None  08/15/2020  6:30 AM MC-LD [place] MC-INDC None   Follow up Visit: Roma Schanz, CNM  P [facility] Admin [facility] Please schedule this patient for PP visit in: 4 weeks  Low risk pregnancy complicated by: late care  Delivery mode: SVD  Anticipated Birth Control: other/unsure, possible interval BTL  PP Procedures needed: none  Schedule Integrated BH visit: no  Provider: Any provider    Mallie Snooks, MSN, CNM Certified Nurse Midwife, [employer] for [employer], [place] 08/10/20 10:09 AM

## 2020-08-09 NOTE — Progress Notes (Signed)
Post Partum Day 1 Subjective: Drinking, voiding, ambulating well.  +flatus.  Lochia and pain wnl.  Denies dizziness, lightheadedness, or sob. Nausea, vomits ibuprofen up. Hasn't eaten since a sandwich last night after delivery. Keeping liquids down.   Objective: Blood pressure 111/63, pulse (!) 54, temperature 97.9 F (36.6 C), temperature source Oral, resp. rate 16, height 5\' 2"  (1.575 m), weight 84.8 kg, last menstrual period 11/02/2019, SpO2 100 %, unknown if currently breastfeeding.  Physical Exam:  General: alert, cooperative and no distress Lochia: appropriate Uterine Fundus: firm Incision: n/a5 DVT Evaluation: No evidence of DVT seen on physical exam. Negative Homan's sign. No cords or calf tenderness. No significant calf/ankle edema.  Recent Labs    08/08/20 2003  HGB 12.5  HCT 37.9    Assessment/Plan: Plan for discharge tomorrow, Breastfeeding, Lactation consult and Contraception thinking about interval BTL. Pt was sleeping before I left room, not currently nauseated. Told family member to ask nurse for zofran when they come back in at 0630 for VS, then order breakfast and take ibuprofen or apap.    LOS: 1 day   2004 08/09/2020, 5:59 AM

## 2020-08-09 NOTE — L&D Delivery Note (Signed)
Delivery Note Jade Brown is a 30 y.o. [id number] at [redacted]w[redacted]d admitted for spontaneous labor.   GBS Status:  Negative/-- (12/09 0857) Maximum Maternal Temperature: 97.9  Labor course: Initial SVE: 4/70/-2. Augmentation with: N/A. She then progressed to complete.  ROM: 0h 3m with clear fluid  Birth: At 2307 a viable female was delivered via spontaneous vaginal delivery (Presentation: direct OA) w/ pt in hands and knees. Nuchal cord present: No.  Shoulders and body delivered in usual fashion. Infant placed directly on mom's abdomen for bonding/skin-to-skin, baby dried and stimulated. Cord clamped x 2 after 1 minute and cut by pt's family member.  Cord blood collected.  The placenta separated spontaneously and delivered via gentle cord traction.  Pitocin infused rapidly IV per protocol.  Fundus firm with massage.  Placenta inspected and appears to be intact with a 3 VC.  Placenta/Cord with the following complications: marginal insertion .  Cord pH: n/a Sponge and instrument count were correct x2.  Intrapartum complications:  None Anesthesia:  none Episiotomy: none Lacerations:  n/a Suture Repair: n/a EBL (mL): 100   Infant: APGAR (1 MIN): 9   APGAR (5 MINS): 9   APGAR (10 MINS):    Infant weight: pending  Mom to postpartum.  Baby to Couplet care / Skin to Skin. Placenta to L&D   Plans to Breastfeed Contraception: undecided, thinking about interval BTL (consent not signed) Circumcision: N/A  Note sent to [facility]: [facility] for pp visit.  Cheral Marker CNM, [facility] 08/09/2020 12:15 AM

## 2020-08-10 MED ORDER — IBUPROFEN 600 MG PO TABS: 600.0000 mg | ORAL_TABLET | Freq: Four times a day (QID) | ORAL | 0 refills | Status: DC

## 2020-08-10 MED ORDER — BISACODYL 10 MG RE SUPP
10.0000 mg | Freq: Every day | RECTAL | 0 refills | Status: DC | PRN
Start: 2020-08-10 — End: 2023-02-16

## 2020-08-10 NOTE — Discharge Instructions (Signed)

## 2020-08-10 NOTE — Clinical Social Work Maternal (Addendum)
CLINICAL SOCIAL WORK MATERNAL/CHILD NOTE  Patient Details  Name: Jade Brown MRN: [id number] Date of Birth: 12/03/1990  Date:  08/10/2020  Clinical Social Worker Initiating Note:  Benita Dowdell, MSW, LCSW Date/Time: Initiated:  08/10/20/0850     Child's Name:  Jade Brown   Biological Parents:  Mother,Father (FOB, Jade Brown, 07/19/1993)   Need for Interpreter:  None   Reason for Referral:  Late or No Prenatal Care    Address:  [address] [place] [address]    Phone number:  [phone] (home)     Additional phone number: none offered  Household Members/Support Persons (HM/SP):   Household Member/Support Person 1,Household Member/Support Person 2,Household Member/Support Person 3   HM/SP Name Relationship DOB or Age  HM/SP -1 Jade Brown, Sr. FOB 07/19/1993  HM/SP -2 Jade Brown Jr. son 09/03/2013  HM/SP -3 Sylena Brown daughter 06/14/2019  HM/SP -4        HM/SP -5        HM/SP -6        HM/SP -7        HM/SP -8          Natural Supports (not living in the home):  Extended Family   Professional Supports: None   Employment: Unemployed   Type of Work:     Education:  High school graduate   Homebound arranged:    Financial Resources:  Medicaid   Other Resources:  Food Stamps ,WIC   Cultural/Religious Considerations Which May Impact Care:  none stated  Strengths:  Ability to meet basic needs ,Home prepared for child ,Pediatrician chosen   Psychotropic Medications:         Pediatrician:    [place] area  Pediatrician List:   [facility]  [place]    [place]    [place]    [place]    [place]      Pediatrician Fax Number:    Risk Factors/Current Problems:  Substance Use    Cognitive State:  Alert ,Able to Concentrate ,Linear Thinking    Mood/Affect:  Comfortable ,Interested ,Calm ,Relaxed    CSW Assessment: CSW received consult for late prenatal  care. CSW met with MOB at bedside to offer support and complete assessment.  On arrival, CSW introduced self and stated reason for visit. Infant, Jade Brown was in the room and being held by MOB. MOB was pleasant and engaged during visit.   During assessment, MOB explained she delayed prenatal care because she had two other pregnancies without concern therefore felt rushing to get PNC was not necessary. CSW expressed understanding, celebrated baby's birth, and explained the benefits of PNC. MOB stated understanding.  CSW explained hosptial's infant drug screen policy related to MOB +UDS 08/02/20(THC+) and late and/or limited PNC. MOB stated understanding and asked appropriate questions. CSW informed MOB of infant's THC positive UDS results, pending CDS results, and warranted CPS report. MOB stated understanding and denied any questions. MOB reported only marijuana use with last use one month ago. MOB related use to nausea and poor appetite. MOB declined SA treatment resources and denied any CPS hx related to other children(Jade Brown Jr, 09/03/2013 and Sylena Brown 06/14/2019).    CSW provided education regarding the baby blues period vs. perinatal mood disorders, discussed treatment and gave resources for mental health follow up if concerns arise.  CSW recommends self-evaluation during the postpartum time period using the New Mom Checklist from Postpartum Progress   and encouraged MOB to contact a medical professional if symptoms are noted at any time.  MOB stated understanding and denied any questions. MOB denied any BH dx or concerns. MOB denied any postpartum hx, SI, HI, or domestic violence. MOB identified FOB and cousin as support and declined any referrals or additional resources.    CSW provided review of Sudden Infant Death Syndrome (SIDS) precautions. MOB stated understanding and denied any questions. MOB confirmed having all needed items for baby including car seat and bassinet for baby's safe sleep.       CSW Plan/Description:  Sudden Infant Death Syndrome (SIDS) Education,Perinatal Mood and Anxiety Disorder (PMADs) Education,Hospital Drug Screen Policy Information,Child Protective Service Report ,CSW Will Continue to Monitor Umbilical Cord Tissue Drug Screen Results and Make Report if Warranted  CSW contact [facility] CPS to complete report for [facility] + infant. Report was completed with CPS intake worker D. Gershon Cull. Report was screened in for f/u in home after discharge. There are no barriers to baby discharging with MOB at this time.    Reonna Finlayson D. Lissa Morales, MSW, LCSW Clinical Social Worker [phone] 08/10/2020, 9:56 AM

## 2020-08-13 ENCOUNTER — Other Ambulatory Visit (HOSPITAL_COMMUNITY): Payer: Medicaid Other

## 2020-08-15 ENCOUNTER — Inpatient Hospital Stay (HOSPITAL_COMMUNITY)
Admission: AD | Admit: 2020-08-15 | Payer: Medicaid Other | Source: Home / Self Care | Admitting: Obstetrics & Gynecology

## 2020-08-15 ENCOUNTER — Inpatient Hospital Stay (HOSPITAL_COMMUNITY): Payer: Medicaid Other

## 2020-09-11 ENCOUNTER — Ambulatory Visit: Payer: Medicaid Other | Admitting: Obstetrics & Gynecology

## 2020-09-11 NOTE — Progress Notes (Deleted)
    Post Partum Visit Note  Jade Brown is a 30 y.o. [phone] female who presents for a postpartum visit. She is {1-10:13787} {time; units:18646} postpartum following a {method of delivery:313099}.  I have fully reviewed the prenatal and intrapartum course. The delivery was at 40.1 gestational weeks.  Anesthesia: {anesthesia types:812}. Postpartum course has been ***. Baby is doing well***. Baby is feeding by {breast/bottle:69}. Bleeding {vag bleed:12292}. Bowel function is {normal:32111}. Bladder function is {normal:32111}. Patient {is/is not:9024} sexually active. Contraception method is {contraceptive method:5051}. Postpartum depression screening: {gen negative/positive:315881}.   The pregnancy intention screening data noted above was reviewed. Potential methods of contraception were discussed. The patient elected to proceed with {Upstream End Methods:24109}.      {Common ambulatory SmartLinks:19316}  Review of Systems {ros; complete:30496}    Objective:  There were no vitals taken for this visit.   General:  {gen appearance:16600}   Breasts:  {breast exam:1202::"inspection negative, no nipple discharge or bleeding, no masses or nodularity palpable"}  Lungs: {lung exam:16931}  Heart:  {heart exam:5510}  Abdomen: {abdomen exam:16834}   Vulva:  {labia exam:12198}  Vagina: {vagina exam:12200}  Cervix:  {cervix exam:14595}  Corpus: {uterus exam:12215}  Adnexa:  {adnexa exam:12223}  Rectal Exam: {rectal/vaginal exam:12274}        Assessment:    *** postpartum exam. Pap smear {done:10129} at today's visit.   Plan:   Essential components of care per ACOG recommendations:  1.  Mood and well being: Patient with {gen negative/positive:315881} depression screening today. Reviewed local resources for support.  - Patient {Action; does/does not:19097} use tobacco. ***If using tobacco we discussed reduction and for recently cessation risk of relapse - hx of drug use? {yes/no:20286}   *** If yes, discussed support systems  2. Infant care and feeding:  -Patient currently breastmilk feeding? {yes/no:20286} ***If breastmilk feeding discussed return to work and pumping. If needed, patient was provided letter for work to allow for every 2-3 hr pumping breaks, and to be granted a private location to express breastmilk and refrigerated area to store breastmilk. Reviewed importance of draining breast regularly to support lactation. -Social determinants of health (SDOH) reviewed in EPIC. No concerns***The following needs were identified***  3. Sexuality, contraception and birth spacing - Patient {DOES_DOES VEL:38101} want a pregnancy in the next year.  Desired family size is {NUMBER 1-10:22536} children.  - Reviewed forms of contraception in tiered fashion. Patient desired {PLAN CONTRACEPTION:313102} today.   - Discussed birth spacing of 18 months  4. Sleep and fatigue -Encouraged family/partner/community support of 4 hrs of uninterrupted sleep to help with mood and fatigue  5. Physical Recovery  - Discussed patients delivery*** and complications - Patient had a *** degree laceration, perineal healing reviewed. Patient expressed understanding - Patient has urinary incontinence? {yes/no:20286}*** Patient was referred to pelvic floor PT  - Patient {ACTION; IS/IS BPZ:02585277} safe to resume physical and sexual activity  6.  Health Maintenance - Last pap smear done *** and was {Normal/abnormal wildcard:19619} with negative HPV. ***Mammogram  7. ***Chronic Disease - PCP follow up  Keval Nam A Cheree Ditto, CMA [facility] for [employer], [facility]

## 2020-10-16 ENCOUNTER — Ambulatory Visit: Payer: Medicaid Other | Admitting: Family Medicine

## 2020-10-22 ENCOUNTER — Ambulatory Visit: Payer: Medicaid Other | Admitting: Family Medicine

## 2020-10-31 ENCOUNTER — Other Ambulatory Visit: Payer: Self-pay

## 2020-10-31 ENCOUNTER — Ambulatory Visit
Admission: RE | Admit: 2020-10-31 | Discharge: 2020-10-31 | Disposition: A | Discharge status: Home / Self Care | Payer: Medicaid Other | Source: Ambulatory Visit | Attending: Family Medicine | Admitting: Family Medicine

## 2020-10-31 ENCOUNTER — Ambulatory Visit: Payer: Medicaid Other | Admitting: Family Medicine

## 2020-10-31 VITALS — BP 110/78 | Ht 63.0 in | Wt 185.0 lb

## 2020-10-31 DIAGNOSIS — M79644 Pain in right finger(s): Secondary | ICD-10-CM | POA: Diagnosis not present

## 2020-10-31 DIAGNOSIS — M654 Radial styloid tenosynovitis [de Quervain]: Secondary | ICD-10-CM | POA: Diagnosis not present

## 2020-10-31 DIAGNOSIS — M79645 Pain in left finger(s): Secondary | ICD-10-CM | POA: Diagnosis not present

## 2020-10-31 DIAGNOSIS — M25531 Pain in right wrist: Secondary | ICD-10-CM | POA: Diagnosis not present

## 2020-10-31 DIAGNOSIS — G8929 Other chronic pain: Secondary | ICD-10-CM

## 2020-10-31 HISTORY — DX: Other chronic pain: G89.29

## 2020-10-31 NOTE — Progress Notes (Signed)
  Jade Brown - 30 y.o. female MRN [id number]  Date of birth: 09-21-1990    SUBJECTIVE:      Chief Complaint:/ HPI:  Right wrist pain.  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  Had started some occupational/physical therapy but this was interrupted by pregnancy and subsequent delivery of her third child.  She continues to have pain although it is about 50% better than initially.  In the last few weeks she has noticed that she has dropped a couple of things and is concerned that something may be worsening.  She is noted no specific swelling of this joint area.  She is right-hand dominant.    OBJECTIVE: BP 110/78   Ht 5\' 3"  (1.6 m)   Wt 185 lb (83.9 kg)   BMI 32.77 kg/m   Physical Exam:  Vital signs are reviewed. GENERAL: Well-developed female no acute distress WRISTS: Symmetrical.  Right wrist reveals no swelling.  She is tender to palpation over the extensor pollicis longus but also has some tenderness over the area of the [facility] joint and the extensor pollicis brevis and abductor pollicis longus.  Grind test is positive.  The [facility] joint does not exhibit any erythema or unusual warmth.  It is symmetrical with the [facility] joint on the left hand.  She has full range of motion of flexion extension at the wrist.  Positive Finkelstein's.  ASSESSMENT & PLAN:  See problem based charting & AVS for pt instructions. No problem-specific Assessment & Plan notes found for this encounter.

## 2020-10-31 NOTE — Assessment & Plan Note (Signed)
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  She had some improvement with occupational therapy so we will set that back up.  I would like to get x-rays of both her CMC joints so we can do some comparison.  I would like to see her back in 2 to 3 weeks and she will hopefully have had a couple of sessions of OT.  She had some hyperpigmentation from previous injection so I would be hesitant to do subsequent injection.  Could potentially consider nitroglycerin patch therapy.

## 2020-10-31 NOTE — Patient Instructions (Addendum)
I wil set up OT again for you I am  Getting some x rays today Please follow up in 2-3 weeks Great to see you!

## 2020-11-03 NOTE — Addendum Note (Signed)
Addended by: Annita Brod on: 11/03/2020 03:40 PM   Modules accepted: Orders

## 2020-11-04 ENCOUNTER — Encounter: Payer: Self-pay | Admitting: Family Medicine

## 2020-11-04 ENCOUNTER — Other Ambulatory Visit: Payer: Self-pay | Admitting: Family Medicine

## 2020-11-04 DIAGNOSIS — M654 Radial styloid tenosynovitis [de Quervain]: Secondary | ICD-10-CM

## 2020-11-14 ENCOUNTER — Ambulatory Visit: Payer: Medicaid Other | Admitting: Family Medicine

## 2020-11-18 ENCOUNTER — Encounter: Payer: Self-pay | Admitting: Occupational Therapy

## 2020-11-18 ENCOUNTER — Ambulatory Visit: Payer: Medicaid Other | Attending: Family Medicine | Admitting: Occupational Therapy

## 2020-11-18 NOTE — Therapy (Signed)
[facility] [address] [place], [place], [address] Phone: [phone]   Fax:  [phone]  Patient Details  Name: Jade Brown MRN: [id number] Date of Birth: 21-Aug-1990 Referring Provider:  No ref. provider found  Encounter Date: 11/18/2020  OCCUPATIONAL THERAPY DISCHARGE SUMMARY  Visits from Start of Care: 4 (Pt was last seen in July 2021, she no-showed for last appt.)  Current functional level related to goals / functional outcomes:Pt met 2/3 short term goals, remaining goals were not assessed as pt no-howed for her last visit.   Remaining deficits:pain, decreased UE functional use   Education / Equipment:Pt was instructed in HEP, splint wear and importance of activity modification to minimize symptoms. Pt verbalized understanding of all educaiton. Plan: Patient agrees to discharge.  Patient goals were partially met. Patient is being discharged due to not returning since the last visit.  ?????     Jade Brown 11/18/2020, 10:55 AM  [facility] [address] [place], [place], [address] Phone: [phone]   Fax:  [phone]

## 2021-01-02 ENCOUNTER — Other Ambulatory Visit: Payer: Self-pay

## 2021-01-02 ENCOUNTER — Ambulatory Visit (HOSPITAL_COMMUNITY)
Admission: EM | Admit: 2021-01-02 | Discharge: 2021-01-02 | Disposition: A | Discharge status: Home / Self Care | Payer: Medicaid Other

## 2021-01-02 DIAGNOSIS — F4321 Adjustment disorder with depressed mood: Secondary | ICD-10-CM

## 2021-01-02 NOTE — Discharge Instructions (Addendum)

## 2021-01-02 NOTE — ED Notes (Signed)
Patient alert and oriented X 4, denies SI, HI and AVH. Received After Visit Summary with community resources. Patient  received all belongings  form [facility] locker. Patient discharge home with family.

## 2021-01-02 NOTE — ED Provider Notes (Addendum)
Behavioral Health Urgent Care Medical Screening Exam  Patient Name: Jade Brown MRN: [id number] Date of Evaluation: 01/02/21 Chief Complaint:   Diagnosis:  Final diagnoses:  Adjustment disorder with depressed mood    History of Present illness: Jade Brown is a 30 y.o. female.  Patient presents voluntarily to [facility], she was transported by [employer].  Jade Brown reports she was involved in a verbal argument with her children's father today resulting in the police being called to their home.  She had planned to take the children on a beach vacation prior to the argument.  Ultimately the children's father refused to go on the beach trip, she then stated "I would kill myself, in the least hurtful way, I cannot swim so I would drown myself."  She denies suicidal ideations to this Clinical research associate.  She reports she made the suicidal statement after the police questioned her as to any plan.  She contracts verbally for safety with this Clinical research associate.  She denies any history of suicide attempts.  She denies any history of self-harm behaviors.  She reports she felt overwhelmed after arguing with her children's father.  She reports recent stressors include 3 small children ages 40 years, 1 year and 36 months old.  She reports she feels that she does everything for the children and the children's father helps very little.  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  She denies any history of outpatient psychiatry follow-up.  She denies any history of mental illness.  She denies any current medications.  She denies any history of psychiatric hospitalizations.  She reports plan to follow-up with outpatient counseling and outpatient medication management.  She resides in [place] with her children's father and 3 children.  She denies access to weapons.  She is self-employed.  She reports her sister and her mother  are strong supports in her life.  She endorses average sleep.  She reports decreased appetite for the last 2 days, attributes this to arguing with her husband.  She denies substance use.  She endorses alcohol use, approximately 2 drinks 3 times per week.  She endorses history of alcohol use disorder but cut back significantly after having her gallbladder removed.  She denies homicidal ideations.  She denies auditory and visual hallucinations.  There is no evidence of delusional thought content and she denies symptoms of paranoia.  Patient offered support and encouragement.  She gives verbal consent to speak with her sister, Jade Brown phone number [phone].  Spoke with patient's sister, Jade Brown who denies concern for patient safety.  She reports patient would not hurt herself or anyone else.  She reports patient can remain at her home until she would like to return home.  Jade Brown will pick up patient from [facility] behavioral health.  Psychiatric Specialty Exam  Presentation  General Appearance:Appropriate for Environment; Casual  Eye Contact:Good  Speech:Clear and Coherent; Normal Rate  Speech Volume:Normal  Handedness:Right   Mood and Affect  Mood:Depressed  Affect:Appropriate; Congruent   Thought Process  Thought Processes:Coherent; Goal Directed  Descriptions of Associations:Intact  Orientation:Full (Time, Place and Person)  Thought Content:Logical    Hallucinations:None  Ideas of Reference:None  Suicidal Thoughts:No  Homicidal Thoughts:No   Sensorium  Memory:Immediate Good; Recent Good; Remote Good  Judgment:Good  Insight:Good   Executive Functions  Concentration:Good  Attention Span:Good  Recall:Good  Fund of Knowledge:Good  Language:Good   Psychomotor Activity  Psychomotor Activity:Normal  Assets  Assets:Communication Skills; Desire for Improvement; Financial Resources/Insurance; Housing; Intimacy; Leisure Time; Physical  Health; Resilience; Social Support; Talents/Skills; Transportation   Sleep  Sleep:Fair  Number of hours: No data recorded  No data recorded  Physical Exam: Physical Exam Vitals and nursing note reviewed.  Constitutional:      Appearance: Normal appearance. She is well-developed and normal weight.  HENT:     Head: Normocephalic.     Nose: Nose normal.  Cardiovascular:     Rate and Rhythm: Normal rate.  Pulmonary:     Effort: Pulmonary effort is normal.  Musculoskeletal:        General: Normal range of motion.     Cervical back: Normal range of motion.  Neurological:     Mental Status: She is alert and oriented to person, place, and time.  Psychiatric:        Attention and Perception: Attention and perception normal.        Mood and Affect: Affect normal. Mood is depressed.        Speech: Speech normal.        Behavior: Behavior normal. Behavior is cooperative.        Thought Content: Thought content normal.        Cognition and Memory: Cognition and memory normal.        Judgment: Judgment normal.    Review of Systems  Constitutional: Negative.   HENT: Negative.   Eyes: Negative.   Respiratory: Negative.   Cardiovascular: Negative.   Gastrointestinal: Negative.   Genitourinary: Negative.   Musculoskeletal: Negative.   Skin: Negative.   Neurological: Negative.   Endo/Heme/Allergies: Negative.   Psychiatric/Behavioral: Positive for depression.   Blood pressure 129/86, pulse 80, temperature 98.4 F (36.9 C), temperature source Oral, resp. rate 16, SpO2 99 %, unknown if currently breastfeeding. There is no height or weight on file to calculate BMI.  Musculoskeletal: Strength & Muscle Tone: within normal limits Gait & Station: normal Patient leans: N/A   [facility] MSE Discharge Disposition for Follow up and Recommendations: Based on my evaluation the patient does not appear to have an emergency medical condition and can be discharged with resources and follow up care  in outpatient services for Medication Management and Individual Therapy  Patient reviewed with Dr. Bronwen Betters. Follow-up with outpatient psychiatry, recommend [facility] behavioral health.   Lenard Lance, FNP 01/02/2021, 5:31 PM

## 2021-03-27 IMAGING — US US OB < 14 WEEKS - US OB TV
1 series · 15 of 28 positions shown · non-contrast
Comparison: 03/01/2019

CLINICAL DATA: Pelvic cramping. Vomiting. Nine weeks 0 days

EXAM:
OBSTETRIC <14 WK US AND TRANSVAGINAL OB US
TECHNIQUE: Both transabdominal and transvaginal ultrasound examinations were
performed for complete evaluation of the gestation as well as the
maternal uterus, adnexal regions, and pelvic cul-de-sac.
Transvaginal technique was performed to assess early pregnancy.

[Series 1: us ob < 14 weeks - us ob tv · 15 of 56 slices shown]
[im 1/56]
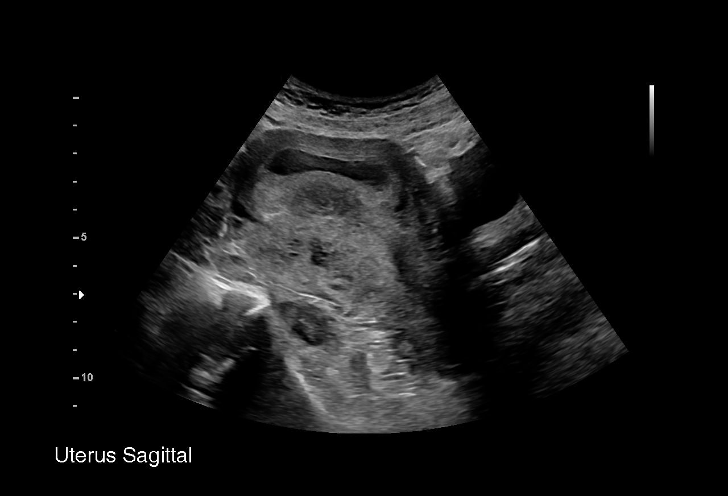
[im 5/56]
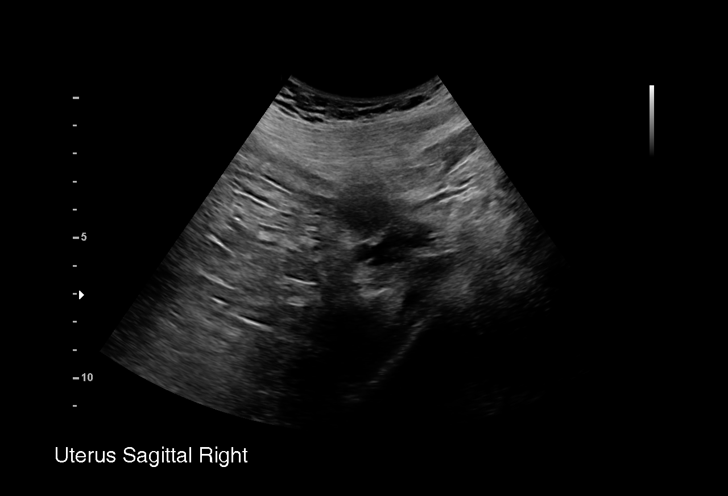
[im 9/56]
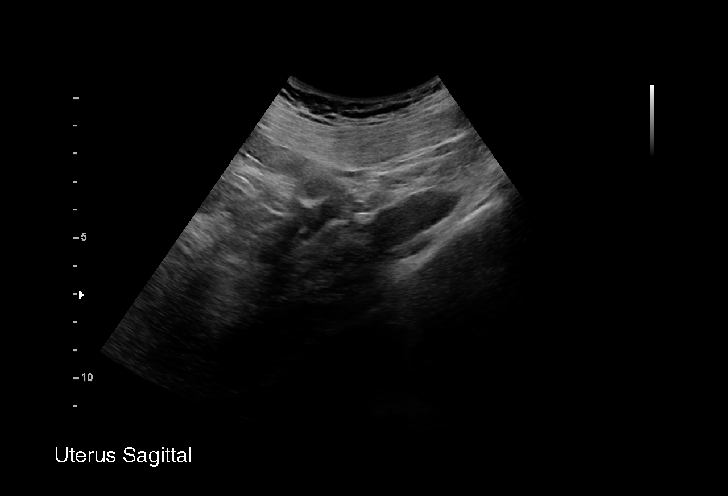
[im 13/56]
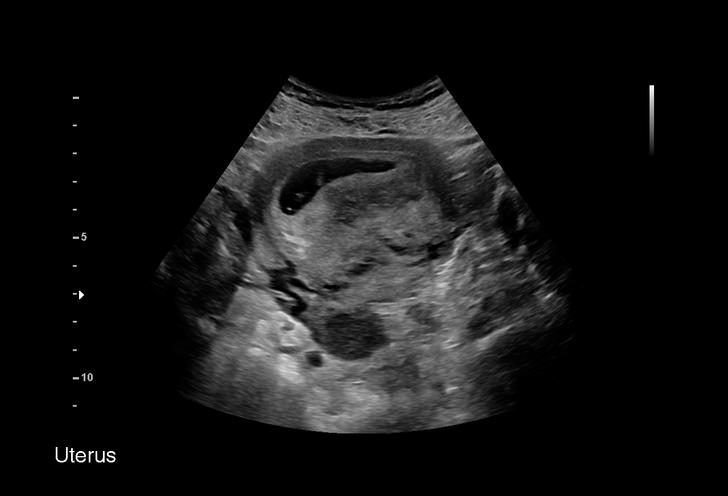
[im 17/56]
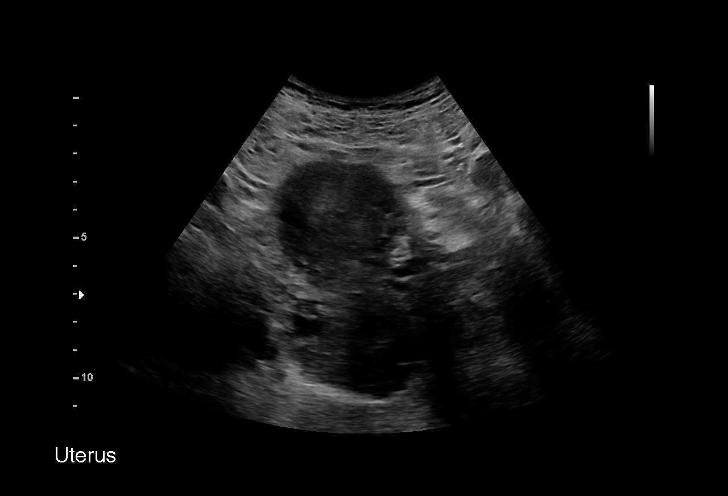
[im 21/56]
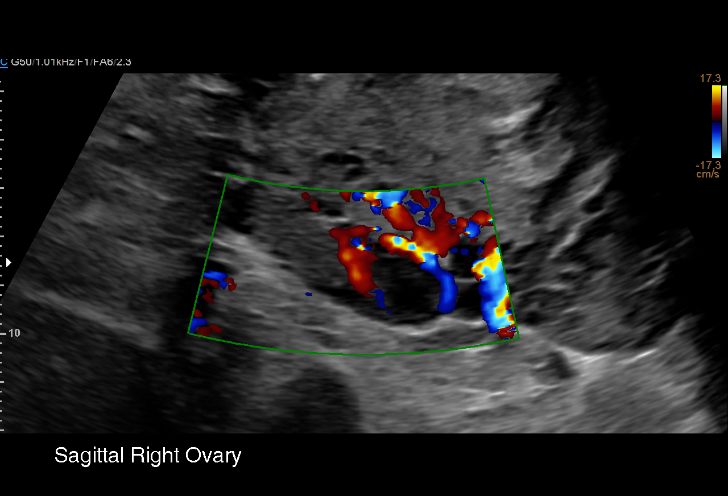
[im 25/56]
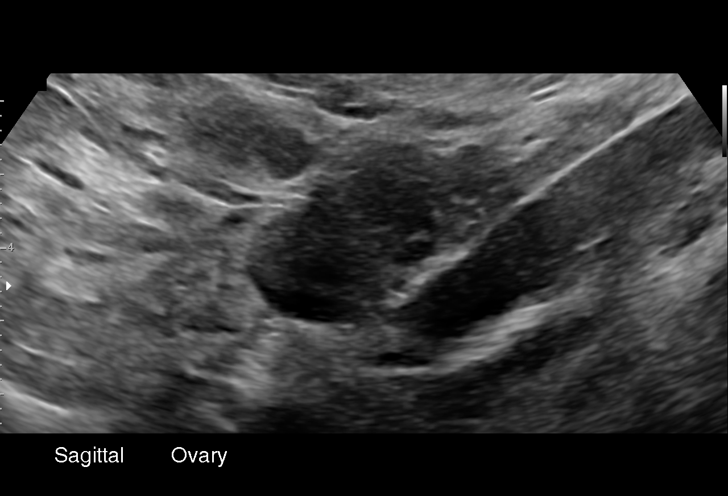
[im 29/56]
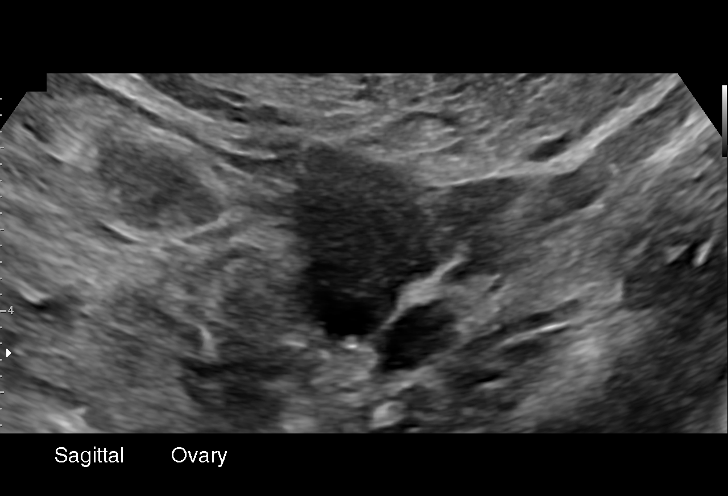
[im 31/56]
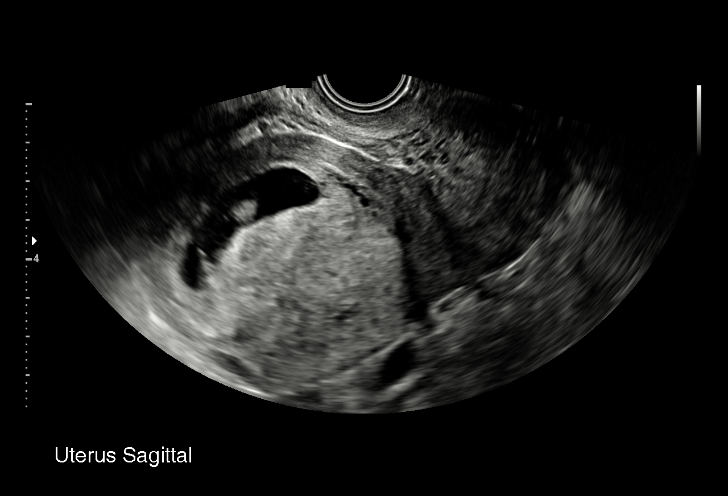
[im 35/56]
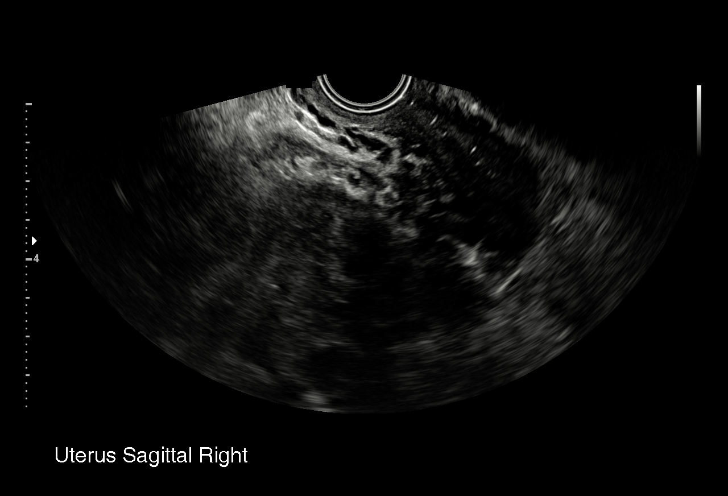
[im 39/56]
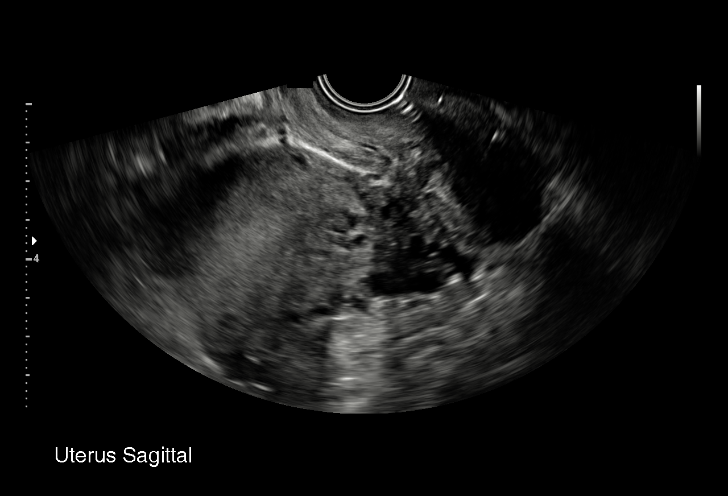
[im 43/56]
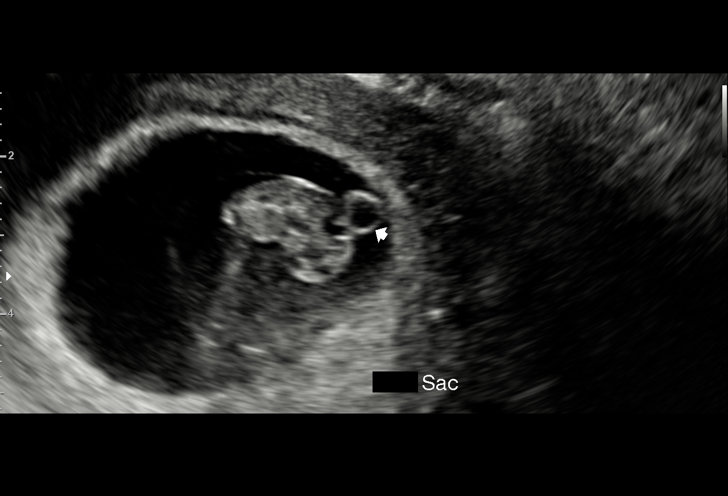
[im 47/56]
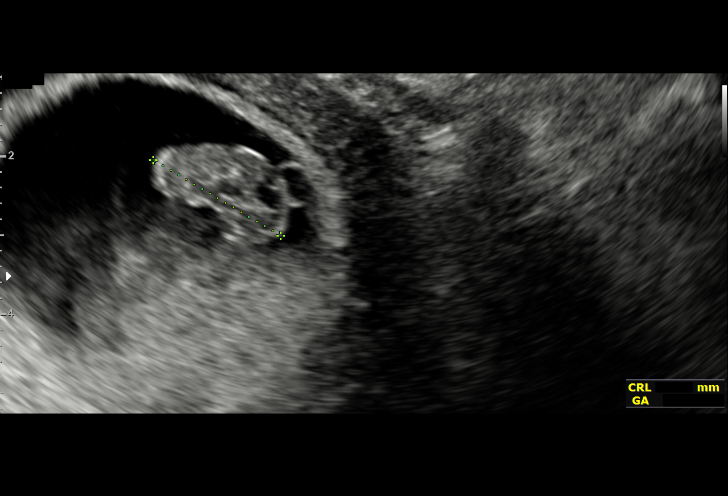
[im 51/56]
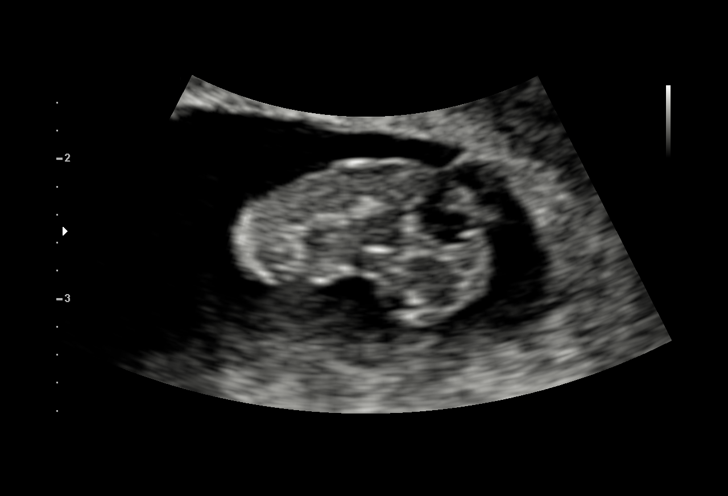
[im 56/56]
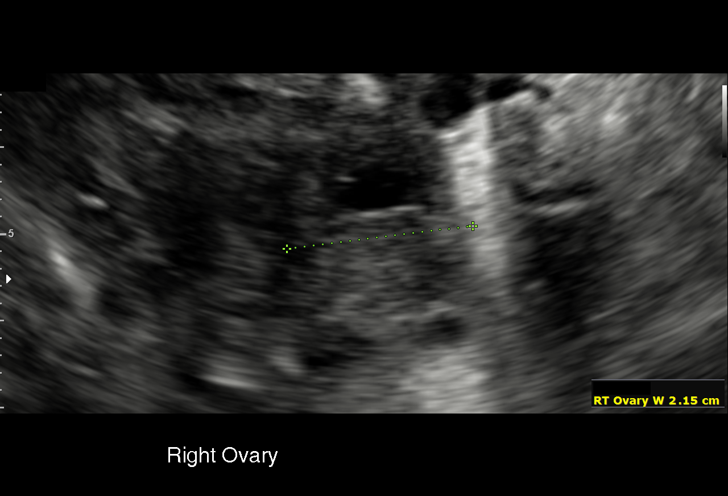

[15 of 28 positions shown; findings below may reference images not displayed]

FINDINGS: Intrauterine gestational sac: Visualized

Yolk sac:  Visualized

Embryo:  Visualized

Cardiac Activity: Visualized

Heart Rate: 174 bpm

CRL:  18.6 mm   8 w   2 d                  US EDC: 08/13/2020

Subchorionic hemorrhage:  None visualized.

Maternal uterus/adnexae: Normal appearing maternal ovaries. No free
peritoneal fluid.
IMPRESSION: Single live intrauterine gestation with an estimated gestational age
of 8 weeks and 2 days. No complicating features.

## 2021-05-05 ENCOUNTER — Other Ambulatory Visit: Payer: Self-pay

## 2021-05-05 ENCOUNTER — Ambulatory Visit (INDEPENDENT_AMBULATORY_CARE_PROVIDER_SITE_OTHER): Payer: Medicaid Other

## 2021-05-05 DIAGNOSIS — Z3491 Encounter for supervision of normal pregnancy, unspecified, first trimester: Secondary | ICD-10-CM | POA: Insufficient documentation

## 2021-05-05 DIAGNOSIS — N912 Amenorrhea, unspecified: Secondary | ICD-10-CM | POA: Diagnosis not present

## 2021-05-05 DIAGNOSIS — Z3481 Encounter for supervision of other normal pregnancy, first trimester: Secondary | ICD-10-CM

## 2021-05-05 LAB — POCT URINE PREGNANCY: Preg Test, Ur: POSITIVE — AB

## 2021-05-05 NOTE — Progress Notes (Signed)
New OB Intake  I connected with  Jade Brown on 05/05/21 at 10:40 AM EDT by in person. Video Visit and verified that I am speaking with the correct person using two identifiers. Nurse is located at [facility] and pt is located at [place].  I discussed the limitations, risks, security and privacy concerns of performing an evaluation and management service by telephone and the availability of in person appointments. I also discussed with the patient that there may be a patient responsible charge related to this service. The patient expressed understanding and agreed to proceed.  I explained I am completing New OB Intake today. We discussed her EDD of 12/13/21 that is based on LMP of 03/08/21. Pt is G5/P3013. I reviewed her allergies, medications, Medical/Surgical/OB history, and appropriate screenings. I informed her of [facility] services. Based on history, this is a/an  pregnancy uncomplicated .   Patient Active Problem List   Diagnosis Date Noted   Encounter for supervision of normal pregnancy in first trimester 05/05/2021   Wrist pain, chronic, right 10/31/2020   Normal labor 08/08/2020   Supervision of other normal pregnancy, antepartum 07/17/2020   De Quervain's tenosynovitis, right 02/18/2020   Bacterial vaginosis 06/11/2019   Rh negative status during pregnancy 12/01/2018    Concerns addressed today  Delivery Plans:  Plans to deliver at [facility].   MyChart/Babyscripts MyChart access verified. I explained pt will have some visits in office and some virtually. Babyscripts instructions given and order placed. Patient verifies receipt of registration text/e-mail. Account successfully created and app downloaded.  Blood Pressure Cuff  Patient still has BP cuff at home from last pregnancy. Explained after first prenatal appt pt will check weekly and document in Babyscripts.  Weight scale: Patient does have weight scale. Weight scale ordered for patient to pick up form [facility].    Anatomy US Explained first scheduled [place] will be around 19 weeks. Dating and viability scan will be scheduled.  Labs Discussed Avelina Laine genetic screening with patient. Would like both Panorama and Horizon drawn at new OB visit. Routine prenatal labs needed.  Covid Vaccine Patient has not covid vaccine.   Mother/ Baby Dyad Candidate?    If yes, offer as possibility  Informed patient of [facility] website  and placed link in her AVS.   Social Determinants of Health Food Insecurity: Patient denies food insecurity. WIC Referral: Patient is interested in referral to [facility].  Transportation: Patient denies transportation needs. Childcare: Discussed no children allowed at ultrasound appointments. Offered childcare services; patient declines childcare services at this time.  Send link to Pregnancy Navigators   Placed OB Box on problem list and updated  First visit review I reviewed new OB appt with pt. I explained she will have a pelvic exam, ob bloodwork with genetic screening, and PAP smear. Explained pt will be seen by not scheduled at this time at first visit; encounter routed to appropriate provider. Explained that patient will be seen by pregnancy navigator following visit with provider. [facility] information placed in AVS.   Hamilton Capri, RN 05/05/2021  11:13 AM

## 2021-05-05 NOTE — Progress Notes (Signed)
Ms. Mancebo presents today for UPT. She has no unusual complaints. LMP: 03/08/21. Patient is [redacted]w[redacted]d today.    OBJECTIVE: Appears well, in no apparent distress.  OB History     Gravida  4   Para  3   Term  3   Preterm      AB  1   Living  3      SAB      IAB  1   Ectopic      Multiple  0   Live Births  3          Home UPT Result: Patient did not take one at home In-Office UPT result: Positive I have reviewed the patient's medical, obstetrical, social, and family histories, and medications.   ASSESSMENT: Positive pregnancy test  PLAN Prenatal care to be completed at: [facility]   EDD: 12/13/20

## 2021-05-06 NOTE — Progress Notes (Signed)
Patient was assessed and managed by nursing staff during this encounter. I have reviewed the chart and agree with the documentation and plan. I have also made any necessary editorial changes.  Warden Fillers, MD 05/06/2021 11:22 AM

## 2021-10-20 ENCOUNTER — Ambulatory Visit: Payer: Medicaid Other

## 2021-10-21 ENCOUNTER — Other Ambulatory Visit: Payer: Self-pay

## 2021-10-21 ENCOUNTER — Ambulatory Visit (INDEPENDENT_AMBULATORY_CARE_PROVIDER_SITE_OTHER): Payer: Medicaid Other | Admitting: Emergency Medicine

## 2021-10-21 ENCOUNTER — Other Ambulatory Visit (HOSPITAL_COMMUNITY)
Admission: RE | Admit: 2021-10-21 | Discharge: 2021-10-21 | Disposition: A | Discharge status: Home / Self Care | Payer: Medicaid Other | Source: Ambulatory Visit | Attending: Obstetrics | Admitting: Obstetrics

## 2021-10-21 VITALS — BP 107/72 | HR 72 | Ht 62.0 in | Wt 161.2 lb

## 2021-10-21 DIAGNOSIS — N898 Other specified noninflammatory disorders of vagina: Secondary | ICD-10-CM

## 2021-10-21 DIAGNOSIS — N912 Amenorrhea, unspecified: Secondary | ICD-10-CM | POA: Diagnosis not present

## 2021-10-21 LAB — POCT URINE PREGNANCY: Preg Test, Ur: POSITIVE — AB

## 2021-10-21 NOTE — Progress Notes (Signed)
SUBJECTIVE:  ?31 y.o. female complains of white vaginal discharge for 1 week. ? ?Denies abnormal vaginal bleeding or significant pelvic pain or ?fever. No UTI symptoms. Denies history of known exposure to STD. ? ?LMP: 09/19/21 ? ?OBJECTIVE:  ?She appears well, afebrile. ?Urine dipstick: not done. ? ?ASSESSMENT:  ?Vaginal Discharge- White,  ?Vaginal Odor- Denies ? ?PLAN:  ?GC, chlamydia, trichomonas, BVAG, CVAG probe sent to lab. ?Treatment: To be determined once lab results are received ?ROV prn if symptoms persist or worsen.  ?Ms. Sprow presents today for UPT. She has no unusual complaints. ? ? ?Also desires UPT today, states that she is a few days late for her cycle. ?LMP: 09/19/2021 ?   ?OBJECTIVE: Appears well, in no apparent distress.  ?OB History   ? ? Gravida  ?5  ? Para  ?3  ? Term  ?3  ? Preterm  ?   ? AB  ?1  ? Living  ?3  ?  ? ? SAB  ?   ? IAB  ?1  ? Ectopic  ?   ? Multiple  ?0  ? Live Births  ?3  ?   ?  ?  ? ?Home UPT Result: n/a ?In-Office UPT result: positive ?I have reviewed the patient's medical, obstetrical, social, and family histories, and medications.  ? ?ASSESSMENT: Positive pregnancy test ? ?PLAN ?Prenatal care to be completed at: [facility] ? ? ?Patient states she will call [facility] to schedule OB appointments, as she wants to think about her options.  ?  ?

## 2021-10-21 NOTE — Progress Notes (Signed)
Patient was assessed and managed by nursing staff during this encounter. I have reviewed the chart and agree with the documentation and plan. I have also made any necessary editorial changes. ? ?Warden Fillers, MD ?10/21/2021 2:56 PM   ?

## 2021-10-22 LAB — CERVICOVAGINAL ANCILLARY ONLY
Bacterial Vaginitis (gardnerella): POSITIVE — AB
Candida Glabrata: NEGATIVE
Candida Vaginitis: NEGATIVE
Chlamydia: NEGATIVE
Comment: NEGATIVE
Comment: NEGATIVE
Comment: NEGATIVE
Comment: NEGATIVE
Comment: NEGATIVE
Comment: NORMAL
Neisseria Gonorrhea: NEGATIVE
Trichomonas: POSITIVE — AB

## 2021-10-26 ENCOUNTER — Other Ambulatory Visit: Payer: Self-pay

## 2021-10-26 ENCOUNTER — Telehealth: Payer: Self-pay

## 2021-10-26 MED ORDER — METRONIDAZOLE 500 MG PO TABS: 500.0000 mg | ORAL_TABLET | Freq: Two times a day (BID) | ORAL | 0 refills | Status: DC

## 2021-10-26 NOTE — Progress Notes (Signed)
error 

## 2021-10-26 NOTE — Telephone Encounter (Signed)
S/w pt and advised of results, treatment plan. Pt stated that she was ok to take Flagyl for treatment, advised pt to take with food. ?

## 2021-11-18 ENCOUNTER — Ambulatory Visit: Payer: Medicaid Other | Admitting: Obstetrics and Gynecology

## 2022-01-01 ENCOUNTER — Ambulatory Visit: Payer: Medicaid Other | Admitting: Obstetrics

## 2022-01-07 ENCOUNTER — Encounter (HOSPITAL_COMMUNITY): Payer: Self-pay | Admitting: Emergency Medicine

## 2022-01-07 ENCOUNTER — Ambulatory Visit (HOSPITAL_COMMUNITY)
Admission: EM | Admit: 2022-01-07 | Discharge: 2022-01-07 | Disposition: A | Discharge status: Home / Self Care | Payer: Medicaid Other | Attending: Emergency Medicine | Admitting: Emergency Medicine

## 2022-01-07 ENCOUNTER — Ambulatory Visit (HOSPITAL_COMMUNITY): Payer: Medicaid Other

## 2022-01-07 DIAGNOSIS — S61011A Laceration without foreign body of right thumb without damage to nail, initial encounter: Secondary | ICD-10-CM | POA: Diagnosis not present

## 2022-01-07 MED ORDER — LIDOCAINE HCL (PF) 1 % IJ SOLN
INTRAMUSCULAR | Status: AC
  Filled 2022-01-07: qty 30

## 2022-01-07 NOTE — Discharge Instructions (Signed)
5 sutures have been placed, please return in 7 to 10 days for removal, you may go to any urgent care locations  Tylenol and ibuprofen every 6 hours as needed for general comfort and to help reduce swelling  Leave dressing on until the end of the day, do not allow sutures to become soaking wet for the next 24 hours, avoid dishwashing or baths, may get wet with handwashing  You may cleanse daily with normal hygiene using diluted soapy water, pat dry and cover with a Band-Aid if at risk for contamination  Please watch for signs of infection such as increased pain, increased swelling, redness, puslike drainage, fever or chills, if this occurs at any point please return to urgent care for reevaluation

## 2022-01-07 NOTE — ED Triage Notes (Signed)
Patient c/o laceration that happened yesterday at 1700.   Patient states " I was handling boxes and a glass broke and cut my finger".   Patient endorses laceration to RT thumb.   Patient has washed area with no relief of symptoms.

## 2022-01-07 NOTE — ED Provider Notes (Signed)
[facility]    CSN: [id number] Arrival date & time: 01/07/22  1206      History   Chief Complaint Chief Complaint  Patient presents with   Laceration    HPI Jade Brown is a 31 y.o. female.   Patient presents with laceration to the right thumb occurring 1 day ago approximately at 1700.  Offending agent is glass.  Has cleaned and covered with bandage.  Endorses numbness of the fingertip.  Range of motion intact.  Denies prior injury or Trauma.  Past Medical History:  Diagnosis Date   Gallstones    Medical history non-contributory     Patient Active Problem List   Diagnosis Date Noted   Encounter for supervision of normal pregnancy in first trimester 05/05/2021   Wrist pain, chronic, right 10/31/2020   Normal labor 08/08/2020   Supervision of other normal pregnancy, antepartum 07/17/2020   De Quervain's tenosynovitis, right 02/18/2020   Bacterial vaginosis 06/11/2019   Rh negative status during pregnancy 12/01/2018    Past Surgical History:  Procedure Laterality Date   CHOLECYSTECTOMY N/A 12/04/2013   Procedure: LAPAROSCOPIC CHOLECYSTECTOMY;  Surgeon: Ralene Ok, MD;  Location: [facility];  Service: General;  Laterality: N/A;   DILATION AND CURETTAGE OF UTERUS     WISDOM TOOTH EXTRACTION      OB History     Gravida  5   Para  3   Term  3   Preterm      AB  1   Living  3      SAB      IAB  1   Ectopic      Multiple  0   Live Births  3            Home Medications    Prior to Admission medications   Medication Sig Start Date End Date Taking? Authorizing Provider  acetaminophen (TYLENOL) 325 MG tablet Take 2 tablets (650 mg total) by mouth every 4 (four) hours as needed (for pain scale < 4). Patient not taking: Reported on 02/05/2020 06/16/19   Gifford Shave, MD  bisacodyl (DULCOLAX) 10 MG suppository Place 1 suppository (10 mg total) rectally daily as needed for moderate constipation. Patient not taking: Reported on 10/21/2021  08/10/20   Darlina Rumpf, CNM  Blood Pressure Monitoring (BLOOD PRESSURE KIT) DEVI 1 Device by Does not apply route as needed. 02/05/20   Griffin Basil, MD  ibuprofen (ADVIL) 600 MG tablet Take 1 tablet (600 mg total) by mouth every 6 (six) hours. Patient not taking: Reported on 10/21/2021 08/10/20   Darlina Rumpf, CNM  metroNIDAZOLE (FLAGYL) 500 MG tablet Take 1 tablet (500 mg total) by mouth 2 (two) times daily. 10/26/21   Griffin Basil, MD  Prenatal Vit-Fe Fumarate-FA (MULTIVITAMIN-PRENATAL) 27-0.8 MG TABS tablet Take 1 tablet by mouth daily at 12 noon. Patient not taking: Reported on 10/21/2021    [provider]    Family History Family History  Problem Relation Age of Onset   Hypertension Mother    Diabetes Mother    Diabetes Maternal Grandmother     Social History Social History   Tobacco Use   Smoking status: Former    Types: Cigars    Quit date: 01/12/2011    Years since quitting: 10.9   Smokeless tobacco: Never  Vaping Use   Vaping Use: Never used  Substance Use Topics   Alcohol use: No   Drug use: No  Allergies   Flagyl [metronidazole]   Review of Systems Review of Systems  Constitutional: Negative.   Respiratory: Negative.    Cardiovascular: Negative.   Skin:  Positive for wound. Negative for color change, pallor and rash.  Neurological: Negative.     Physical Exam Triage Vital Signs ED Triage Vitals  Enc Vitals Group     BP 01/07/22 1333 109/67     Pulse Rate 01/07/22 1333 (!) 57     Resp 01/07/22 1333 16     Temp 01/07/22 1333 98 F (36.7 C)     Temp Source 01/07/22 1333 Oral     SpO2 01/07/22 1333 98 %     Weight --      Height --      Head Circumference --      Peak Flow --      Pain Score 01/07/22 1336 10     Pain Loc --      Pain Edu? --      Excl. in [place]? --    No data found.  Updated Vital Signs BP 109/67 (BP Location: Right Arm)   Pulse (!) 57   Temp 98 F (36.7 C) (Oral)   Resp 16   LMP 01/06/2022  (Exact Date)   SpO2 98%   Breastfeeding No   Visual Acuity Right Eye Distance:   Left Eye Distance:   Bilateral Distance:    Right Eye Near:   Left Eye Near:    Bilateral Near:     Physical Exam Constitutional:      Appearance: Normal appearance.  HENT:     Head: Normocephalic.  Eyes:     Extraocular Movements: Extraocular movements intact.  Pulmonary:     Effort: Pulmonary effort is normal.  Skin:    Comments: 1 cm laceration at the distal phalanx of the thumb on the palmar aspect, no involvement of the nail or DIP joint, clotting present on part of the laceration, bleeding has subsided  Neurological:     Mental Status: She is alert and oriented to person, place, and time. Mental status is at baseline.  Psychiatric:        Mood and Affect: Mood normal.        Behavior: Behavior normal.     UC Treatments / Results  Labs (all labs ordered are listed, but only abnormal results are displayed) Labs Reviewed - No data to display  EKG   Radiology No results found.  Procedures Laceration Repair  Date/Time: 01/07/2022 2:38 PM Performed by: Hans Eden, NP Authorized by: Hans Eden, NP   Consent:    Consent obtained:  Verbal   Consent given by:  Patient   Risks discussed:  Infection and pain   Alternatives discussed:  No treatment Universal protocol:    Procedure explained and questions answered to patient or proxy's satisfaction: yes     Patient identity confirmed:  Verbally with patient Anesthesia:    Anesthesia method:  Local infiltration   Local anesthetic:  Lidocaine 1% w/o epi Laceration details:    Location:  Finger   Finger location:  R thumb   Length (cm):  1 Pre-procedure details:    Preparation:  Patient was prepped and draped in usual sterile fashion Exploration:    Limited defect created (wound extended): yes     Wound exploration: entire depth of wound visualized   Treatment:    Area cleansed with:  Chlorhexidine   Amount of  cleaning:  Standard   Irrigation  method:  Tap Skin repair:    Repair method:  Sutures   Suture size:  5-0   Suture material:  Prolene   Suture technique:  Simple interrupted   Number of sutures:  5 Approximation:    Approximation:  Close Repair type:    Repair type:  Simple Post-procedure details:    Dressing:  Non-adherent dressing   Procedure completion:  Tolerated (including critical care time)  Medications Ordered in UC Medications - No data to display  Initial Impression / Assessment and Plan / UC Course  I have reviewed the triage vital signs and the nursing notes.  Pertinent labs & imaging results that were available during my care of the patient were reviewed by me and considered in my medical decision making (see chart for details).  Laceration of right thumb without foreign body without damage to nail, initial encounter  Sutures generalized, advised patient to return in 7 to 10 days for removal, nonadherent dressing or pressure wrap applied,  to be removed at end of day, advised against site becoming soaked with water For the next 24 hours, may become wet with  handwashing ,may Clean daily with diluted soap and water, pat dry and cover  If at risk for contamination, may use Tylenol and ibuprofen for comfort, given strict precautions for signs of infection to return to urgent care for reevaluation this time   Final Clinical Impressions(s) / UC Diagnoses   Final diagnoses:  Laceration of right thumb without foreign body without damage to nail, initial encounter     Discharge Instructions      5 sutures have been placed, please return in 7 to 10 days for removal, you may go to any urgent care locations  Tylenol and ibuprofen every 6 hours as needed for general comfort and to help reduce swelling  Leave dressing on until the end of the day, do not allow sutures to become soaking wet for the next 24 hours, avoid dishwashing or baths, may get wet with handwashing  You  may cleanse daily with normal hygiene using diluted soapy water, pat dry and cover with a Band-Aid if at risk for contamination  Please watch for signs of infection such as increased pain, increased swelling, redness, puslike drainage, fever or chills, if this occurs at any point please return to urgent care for reevaluation   ED Prescriptions   None    PDMP not reviewed this encounter.   Hans Eden, NP 01/07/22 1441

## 2022-01-28 ENCOUNTER — Ambulatory Visit: Payer: Medicaid Other | Admitting: Obstetrics

## 2022-02-18 ENCOUNTER — Other Ambulatory Visit (HOSPITAL_COMMUNITY)
Admission: RE | Admit: 2022-02-18 | Discharge: 2022-02-18 | Disposition: A | Discharge status: Home / Self Care | Payer: Medicaid Other | Source: Ambulatory Visit | Attending: Obstetrics | Admitting: Obstetrics

## 2022-02-18 ENCOUNTER — Ambulatory Visit (INDEPENDENT_AMBULATORY_CARE_PROVIDER_SITE_OTHER): Payer: Medicaid Other

## 2022-02-18 VITALS — BP 132/81 | HR 51 | Ht 62.0 in | Wt 161.6 lb

## 2022-02-18 DIAGNOSIS — Z01419 Encounter for gynecological examination (general) (routine) without abnormal findings: Secondary | ICD-10-CM | POA: Insufficient documentation

## 2022-02-18 DIAGNOSIS — Z124 Encounter for screening for malignant neoplasm of cervix: Secondary | ICD-10-CM | POA: Insufficient documentation

## 2022-02-18 DIAGNOSIS — Z113 Encounter for screening for infections with a predominantly sexual mode of transmission: Secondary | ICD-10-CM | POA: Diagnosis present

## 2022-02-18 DIAGNOSIS — Z1331 Encounter for screening for depression: Secondary | ICD-10-CM | POA: Diagnosis not present

## 2022-02-18 DIAGNOSIS — Z1239 Encounter for other screening for malignant neoplasm of breast: Secondary | ICD-10-CM

## 2022-02-18 DIAGNOSIS — Z30011 Encounter for initial prescription of contraceptive pills: Secondary | ICD-10-CM

## 2022-02-18 DIAGNOSIS — Z7251 High risk heterosexual behavior: Secondary | ICD-10-CM

## 2022-02-18 LAB — POCT URINE PREGNANCY: Preg Test, Ur: NEGATIVE

## 2022-02-18 MED ORDER — LEVONORGESTREL 1.5 MG PO TABS: 1.5000 mg | ORAL_TABLET | Freq: Once | ORAL | Status: DC

## 2022-02-18 MED ORDER — LEVONORGESTREL-ETHINYL ESTRAD 0.15-30 MG-MCG PO TABS: 1.0000 | ORAL_TABLET | Freq: Every day | ORAL | 3 refills | Status: DC

## 2022-02-18 NOTE — Patient Instructions (Addendum)
AREA FAMILY PRACTICE PHYSICIANS  [place] ([address]) [facility] [address] [place], [place] [address] [phone] Mon-Fri 8:30-12:30, 1:30-5:00 Accepting Medicaid [facility] [address], [place], [place] [address] [phone] Mon-Fri 8:00-5:30 [facility] [address] [place], [place] [address] [phone] Mon, Tue, Thur, Fri 8:30-5:00, Wed 10:00-7:00 (closed 1-2pm) Accepting Medicaid [facility] [address], [place], [place]  [address] Phone - [phone]   Fax - [phone]  [place] ([address]) [facility] [address] [place], [place] [address] [phone] Mon-Fri 8:00-5:00 [facility] ([facility])  [address] [place], [place] [address] [phone] Mon-Fri 8:30-5:30, Sat (Oct.-Mar.) 9:00-1:00 Accepting Medicaid  [place] ([address]) [facility] [address], [place], [place] [address] [phone] Mon-Fri 8:00-5:00  [place] ([address]) [facility] [address], [place], [place] [address] [phone] Mon-Fri 8:00-5:00 [facility] [address], [place], [place] [address] [phone] Mon-Fri 8:00-5:00 [facility] [address] [place], [place] [address] [phone] Mon-Fri 8:00-5:00 [facility] [address] [place] [address] [phone] Mon-Fri 7:30-5:30  [place] ([address] & [address]) [facility] [address] [place], [place] [address] [phone] Mon-Thur 8:00-6:00 Accepting Medicaid [facility] [address] [place], [place] [address] [phone] Mon-Thur 7:30-7:30, Fri 7:30-4:30 Accepting  Medicaid [facility] [address], [place], [place]  [address] [phone]   Fax - [phone]  [place]west  ([address] & [address]) [facility] [address] , [place] [address] [phone] Mon-Fri 7:00-5:00 [facility] [address], [place], [place] [address] [phone] Mon-Fri 8:00-5:00 Accepting Medicaid [facility] [address], , [place] [address] [phone] Mon-Fri 8:00-5:00 Accepting Medicaid  [place] ([address]) [facility] [place] [address] [place], [place] [address] [phone] Mon-Fri 8:00-5:00 [facility] [address], [place], [place] [address] [phone] Mon-Fri 8:00-5:00 Accepting Medicaid [facility] ([facility] [address], [place], [place] [address] [phone] Mon-Fri 8:00-5:30, Sat&Sun by appointment (phones open at 8:30) Accepting Medicaid  [place] ([address]) [facility] [address] [place], [place] [address] [phone] Mon-Thur 8:00-7:00, Fri 8:00-5:00, Sat 8:00-12:00, Sun 9:00-12:00 Accepting Medicaid [facility] [address], [place], [place] [address] [phone] Mon-Thur 8:00-5:00 Accepting Medicaid [facility] [address] [place], [place] [address] [phone] Mon-Fri 8:00-5:30, Sat (Oct.-Mar.) 9:00-1:00 Accepting Medicaid  [place] ([address]) [facility] [address], [place], [place] [address] [phone] Mon-Fri 8:00-5:00 Accepting Medicaid   [place] ([address]) [facility] [place] [address], [place], [place] [address] [phone] Mon-Fri 8:00-5:00 [facility] [place] [address], [place], [place] [address] [phone] Mon-Fri 8:00-5:00 [facility] [place] [address] [place] [address], [place], [place] [address] [phone] Mon-Fri 8:00-5:00 After hours clinic ([address] [place], [place] [address]) [phone] Mon-Fri 5:00-8:00, Sat 12:00-6:00, Sun 10:00-4:00 Accepting Medicaid [facility] [place]   [address], [place], [place]  [address] [phone]   Fax - [phone]  [place] ([address]) [facility] [place] [facility] [address], [place], [place] [address] [phone] Mon-Fri 8:00-5:00 [facility] [place] ([facility] [place]) [address], [place], [place] [address] [phone] Mon-Thur 8:00-7:00, Fri 8:00-5:00, Sat 8:00-12:00    

## 2022-02-18 NOTE — Progress Notes (Signed)
GYNECOLOGY OFFICE VISIT NOTE-WELL WOMAN EXAM  History:   Jade Brown [id number] here today for annual exam. She denies any abnormal vaginal discharge, bleeding, pelvic pain or other concerns. Reports LMP was February 01, 2022 and was normal.  She reports last sexual intercourse was last night and was without protection.  Patient states she does not desire pregnancy at this time.   Birth Control:  None; Desires Pills. Has used Nexplanon and Depo. She reports she liked Nexplanon, but felt it contributed to her depression.   Reproductive Concerns Sexually Activity: Yes Partners Type: Female Number of partners in last year: One STD Testing: Full  Vaginal/GU Concerns: None Breast Concerns/Exams: No concerns. Checks breast weekly. Endorses SBE. No family history of breast, uterine, cervical, or ovarian cancer  Medical and Nutrition PCP: None Significant PMx: Depression, Never seen therapist. No medication.  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   Past Medical History:  Diagnosis Date   Gallstones    Medical history non-contributory     Past Surgical History:  Procedure Laterality Date   CHOLECYSTECTOMY N/A 12/04/2013   Procedure: LAPAROSCOPIC CHOLECYSTECTOMY;  Surgeon: Axel Filler, MD;  Location: [facility] OR;  Service: General;  Laterality: N/A;   DILATION AND CURETTAGE OF UTERUS     WISDOM TOOTH EXTRACTION      The following portions of the patient's history were reviewed and updated as appropriate: allergies, current medications, past family history, past medical history, past social history, past surgical history and problem list.   Health Maintenance:  Pap collected today.  No mammogram on file d/t age.   Review of Systems:  Pertinent items noted in HPI and remainder of comprehensive ROS otherwise negative.     Objective:    Physical Exam BP 132/81   Pulse (!) 51   Ht 5\' 2"  (1.575 m)   Wt 161 lb 9.6 oz (73.3 kg)   LMP 02/01/2022 (Approximate)   BMI 29.56 kg/m  Physical Exam Vitals reviewed. Exam conducted with a chaperone present.  Constitutional:      General: She is not in acute distress.    Appearance: Normal appearance.  HENT:     Head: Normocephalic and atraumatic.  Eyes:     Conjunctiva/sclera: Conjunctivae normal.  Cardiovascular:     Rate and Rhythm: Normal rate and regular rhythm.     Heart sounds: Normal heart sounds.  Pulmonary:     Effort: Pulmonary effort is normal. No respiratory distress.     Breath sounds: Normal breath sounds.  Chest:  Breasts:    Right: No mass, nipple discharge, skin change or tenderness.     Left: No mass, nipple discharge, skin change or tenderness.     Comments: CBE completed Bilateral fibrocystic breast changes noted.  Abdominal:     General: Bowel sounds are normal.     Palpations: Abdomen is soft.     Tenderness: There is no abdominal tenderness.  Genitourinary:    Comments: Normal external genitalia. No tenderness or lesions. Pink mucosa.  Scant amt discharge. Cervix pink and without lesions, cysts, or polyps. No active bleeding or discharge noted. Pap collected with brush and spatula.  CV collected from vaginal walls.   Musculoskeletal:        General: Normal range of motion.     Cervical back: Normal range of motion.  Skin:    General: Skin is warm  and dry.  Neurological:     Mental Status: She is alert and oriented to person, place, and time.  Psychiatric:        Mood and Affect: Mood normal.        Behavior: Behavior normal.      Labs and Imaging No results found for this or any previous visit (from the past 168 hour(s)). No results found.   Assessment & Plan:       1. Well woman exam with routine gynecological exam -Exam performed and findings discussed. -Encouraged to activate and utilize Mychart for  reviewing of results, communication with office, and scheduling of appts. -Encouraged to obtain PCP and list provided in AVS.  - Cytology - PAP( [place]) - Cervicovaginal ancillary only - Hepatitis B surface antigen - Hepatitis C antibody - RPR - HIV Antibody (routine testing w rflx) - Ambulatory referral to Integrated Behavioral Health - POCT urine pregnancy  2. Pap smear for cervical cancer screening -Pap collected and pending.  -Educated on ASCCP guidelines regarding pap smear evaluation and frequency. -Informed of turnover time and provider/clinic policy on releasing results. - Cytology - PAP( )  3. Screen for STD (sexually transmitted disease) -CV collected. -Labs ordered. -Plan to treat as appropriate.  - Cervicovaginal ancillary only - Hepatitis B surface antigen - Hepatitis C antibody - RPR - HIV Antibody (routine testing w rflx)  4. Positive screening for depression on 9-item Patient Health Questionnaire (PHQ-9) -GAD 13/PHQ 13 -Ambulatory referral to Integrated Behavioral Health  5. Screening breast examination -CBE completed and normal -Educated and encouraged to continue SBE with increased breast awareness including examination of breast for skin changes, moles, tenderness, etc.   6. Encounter for initial prescription of contraceptive pills -Rx for [place]ia sent to pharmacy on file.  -Plan to RTO in 3 months for blood pressure check and continuation of pills if desired.  -Instructed to take UPT if no period after completion of first pack. -Information given for LARC method-IUD.    7. Unprotected sexual intercourse -Discussed usage of condoms over next 7 days to avoid further risk for pregnancy. -Plan B sent to pharmacy on file.  -Instructed to take today and then start oral contraception tomorrow.     Routine preventative health maintenance measures emphasized. Please refer to After Visit Summary for other counseling recommendations.   No  follow-ups on file.      Cherre Robins, CNM 02/18/2022

## 2022-02-18 NOTE — Progress Notes (Signed)
Pt presents for annual, all STD testing, and start BCP.  Last unprotected IC was yesterday.  Negative pap at [facility] 06/2018 PHQ9= 13 GAD7= 13  - request counseling services

## 2022-02-19 ENCOUNTER — Other Ambulatory Visit: Payer: Self-pay

## 2022-02-19 DIAGNOSIS — Z7251 High risk heterosexual behavior: Secondary | ICD-10-CM

## 2022-02-19 LAB — CERVICOVAGINAL ANCILLARY ONLY
Chlamydia: NEGATIVE
Comment: NEGATIVE
Comment: NEGATIVE
Comment: NORMAL
Neisseria Gonorrhea: NEGATIVE
Trichomonas: NEGATIVE

## 2022-02-19 LAB — RPR: RPR Ser Ql: NONREACTIVE

## 2022-02-19 LAB — HEPATITIS C ANTIBODY: Hep C Virus Ab: NONREACTIVE

## 2022-02-19 LAB — HIV ANTIBODY (ROUTINE TESTING W REFLEX): HIV Screen 4th Generation wRfx: NONREACTIVE

## 2022-02-19 LAB — HEPATITIS B SURFACE ANTIGEN: Hepatitis B Surface Ag: NEGATIVE

## 2022-02-19 MED ORDER — LEVONORGESTREL 1.5 MG PO TABS: 1.5000 mg | ORAL_TABLET | Freq: Once | ORAL | 0 refills | Status: AC

## 2022-02-22 ENCOUNTER — Encounter: Payer: Medicaid Other | Admitting: Licensed Clinical Social Worker

## 2022-02-22 LAB — CYTOLOGY - PAP: Diagnosis: NEGATIVE

## 2022-02-23 ENCOUNTER — Telehealth: Payer: Self-pay | Admitting: Licensed Clinical Social Worker

## 2022-02-23 ENCOUNTER — Encounter: Payer: Medicaid Other | Admitting: Licensed Clinical Social Worker

## 2022-02-23 NOTE — Telephone Encounter (Signed)
Called pt twice regarding scheduled phone visit. Unable to leave message due to mailbox is full

## 2022-02-24 ENCOUNTER — Ambulatory Visit: Payer: Medicaid Other | Admitting: Licensed Clinical Social Worker

## 2022-06-10 ENCOUNTER — Ambulatory Visit: Payer: Medicaid Other | Admitting: Obstetrics

## 2022-08-09 NOTE — L&D Delivery Note (Signed)
OB/GYN Faculty Practice Delivery Note  Jade Brown is a 32 y.o. [phone] s/p SVD in the waterbirth tub at [redacted]w[redacted]d. She was admitted for SOL.   ROM: rupture date, rupture time, delivery date, or delivery time have not been documented with Clear fluid GBS Status:  Positive/-- (10/01 1017) Maximum Maternal Temperature: 97.9  Labor Progress: Initial SVE: 6cm. She was transitioned to the waterbirth tub at 7.5cm and when she needed water for coping. She then progressed to complete.   Delivery Date/Time: 05/17/23 at 15:21 Delivery: I was called to the room due to increased pressure with contractions. [name] was feeling pressure and urge to push but her partner was not yet in the building. She was waiting for her partner Jade Brown.   Once Jade Brown arrived, the patient began to push. Jade Brown was in the hand and knees position. Head delivered easily. No nuchal cord present. Shoulder and body delivered in usual fashion. Infant was delivered posteriorly and then brought out of the water. Mother then needed to transition to a different position. RN assisted with keeping the infant above the water while mother stood up and infant was passed under her leg.  Infant with spontaneous cry, placed on mother's abdomen, dried and stimulated. Cord was white and pulsation had stopped. Cord clamped x 2  and cut by Jade Brown.    Jade Brown then stood and exited the waterbirth tub. Cord blood was drained into awaiting basin. She was lying on her right side and  placenta delivered spontaneously with gentle cord traction. Fundus firm with massage and Pitocin. Labia, perineum, vagina, and cervix inspected with no tears.   Baby Weight: pending  Placenta: 3 vessel, intact. Sent to L&D Complications: None Lacerations: None EBL: 25 mL Analgesia: hydrotherapy  Infant:  APGAR (1 MIN): 8  APGAR (5 MINS): 9   Federico Flake, MD [facility] [employer], [facility] 05/17/2023, 4:26 PM

## 2022-10-01 ENCOUNTER — Ambulatory Visit: Payer: Medicaid Other

## 2022-10-26 ENCOUNTER — Other Ambulatory Visit (HOSPITAL_COMMUNITY)
Admission: RE | Admit: 2022-10-26 | Discharge: 2022-10-26 | Disposition: A | Discharge status: Home / Self Care | Payer: Medicaid Other | Source: Ambulatory Visit | Attending: Obstetrics and Gynecology | Admitting: Obstetrics and Gynecology

## 2022-10-26 ENCOUNTER — Ambulatory Visit (INDEPENDENT_AMBULATORY_CARE_PROVIDER_SITE_OTHER): Payer: Medicaid Other

## 2022-10-26 ENCOUNTER — Other Ambulatory Visit (INDEPENDENT_AMBULATORY_CARE_PROVIDER_SITE_OTHER): Payer: Medicaid Other

## 2022-10-26 VITALS — BP 118/77 | HR 47 | Ht 62.0 in | Wt 161.2 lb

## 2022-10-26 DIAGNOSIS — Z3A09 9 weeks gestation of pregnancy: Secondary | ICD-10-CM

## 2022-10-26 DIAGNOSIS — Z3480 Encounter for supervision of other normal pregnancy, unspecified trimester: Secondary | ICD-10-CM | POA: Diagnosis present

## 2022-10-26 DIAGNOSIS — Z1339 Encounter for screening examination for other mental health and behavioral disorders: Secondary | ICD-10-CM | POA: Diagnosis not present

## 2022-10-26 DIAGNOSIS — Z3481 Encounter for supervision of other normal pregnancy, first trimester: Secondary | ICD-10-CM

## 2022-10-26 DIAGNOSIS — O3680X0 Pregnancy with inconclusive fetal viability, not applicable or unspecified: Secondary | ICD-10-CM

## 2022-10-26 MED ORDER — BLOOD PRESSURE KIT DEVI: 1.0000 | 0 refills | Status: DC

## 2022-10-26 MED ORDER — GOJJI WEIGHT SCALE MISC: 1.0000 | 0 refills | Status: DC

## 2022-10-26 NOTE — Progress Notes (Signed)
New OB Intake  I connected with Jade Brown  on 10/26/22 at  1:10 PM EDT by in person and verified that I am speaking with the correct person using two identifiers. Nurse is located at [facility] and pt is located at [facility].  I discussed the limitations, risks, security and privacy concerns of performing an evaluation and management service by telephone and the availability of in person appointments. I also discussed with the patient that there may be a patient responsible charge related to this service. The patient expressed understanding and agreed to proceed.  I explained I am completing New OB Intake today. We discussed EDD of 05/27/23 that is based on LMP of 08/20/22. Pt is G7/P3023. I reviewed her allergies, medications, Medical/Surgical/OB history, and appropriate screenings. I informed her of [facility] services. [facility] information placed in AVS. Based on history, this is a low risk pregnancy.  Patient Active Problem List   Diagnosis Date Noted   Encounter for supervision of normal pregnancy in first trimester 05/05/2021   Wrist pain, chronic, right 10/31/2020   Normal labor 08/08/2020   Supervision of other normal pregnancy, antepartum 07/17/2020   De Quervain's tenosynovitis, right 02/18/2020   Bacterial vaginosis 06/11/2019   Rh negative status during pregnancy 12/01/2018    Concerns addressed today  Delivery Plans Plans to deliver at [facility]. Patient given information for [facility] Healthy Baby website for more information about Women's and [place]. Patient is interested in water birth. Offered upcoming OB visit with CNM to discuss further.  MyChart/Babyscripts MyChart access verified. I explained pt will have some visits in office and some virtually. Babyscripts instructions given and order placed. Patient verifies receipt of registration text/e-mail. Account successfully created and app downloaded.  Blood Pressure Cuff/Weight Scale Blood pressure cuff ordered for patient to  pick-up from [employer]. Explained after first prenatal appt pt will check weekly and document in 69. Patient does not have weight scale; order sent to [facility], patient may track weight weekly in Babyscripts.  Anatomy US Explained first scheduled [place] will be around 19 weeks. Dating and viability. Anatomy US TBD.  Labs Discussed Jade Brown genetic screening with patient. Would like both Panorama and Horizon drawn at new OB visit. Routine prenatal labs needed.  COVID Vaccine Patient has not had COVID vaccine.   Is patient a CenteringPregnancy candidate?  Declined Declined due to Group setting Not a candidate due to  If accepted,    Social Determinants of Health Food Insecurity: Patient denies food insecurity. WIC Referral: Patient is interested in referral to [facility].  Transportation: Patient denies transportation needs. Childcare: Discussed no children allowed at ultrasound appointments. Offered childcare services; patient expresses need for childcare services. Childcare scheduled for appropriate appointments and information given to patient.  Interested in [place]? If yes, send referral and doula dot phrase.   First visit review I reviewed new OB appt with patient. I explained they will have a provider visit that includes pap smear, genetic screening, and discuss plan of care for pregnancy. Explained pt will be seen by Jade Brown at first visit; encounter routed to appropriate provider. Explained that patient will be seen by pregnancy navigator following visit with provider.   Jade Lei, RN 10/26/2022  1:29 PM

## 2022-10-27 LAB — CBC/D/PLT+RPR+RH+ABO+RUBIGG...
Antibody Screen: NEGATIVE
Basophils Absolute: 0 10*3/uL (ref 0.0–0.2)
Basos: 0 %
EOS (ABSOLUTE): 0.2 10*3/uL (ref 0.0–0.4)
Eos: 1 %
HCV Ab: NONREACTIVE
HIV Screen 4th Generation wRfx: NONREACTIVE
Hematocrit: 36.3 % (ref 34.0–46.6)
Hemoglobin: 11.8 g/dL (ref 11.1–15.9)
Hepatitis B Surface Ag: NEGATIVE
Immature Grans (Abs): 0 10*3/uL (ref 0.0–0.1)
Immature Granulocytes: 0 %
Lymphocytes Absolute: 2.5 10*3/uL (ref 0.7–3.1)
Lymphs: 17 %
MCH: 30.2 pg (ref 26.6–33.0)
MCHC: 32.5 g/dL (ref 31.5–35.7)
MCV: 93 fL (ref 79–97)
Monocytes Absolute: 0.7 10*3/uL (ref 0.1–0.9)
Monocytes: 5 %
Neutrophils Absolute: 11.6 10*3/uL — ABNORMAL HIGH (ref 1.4–7.0)
Neutrophils: 77 %
Platelets: 325 10*3/uL (ref 150–450)
RBC: 3.91 x10E6/uL (ref 3.77–5.28)
RDW: 12.4 % (ref 11.7–15.4)
RPR Ser Ql: NONREACTIVE
Rh Factor: NEGATIVE
Rubella Antibodies, IGG: 1.22 index (ref >0.99)
WBC: 15.1 10*3/uL — ABNORMAL HIGH (ref 3.4–10.8)

## 2022-10-27 LAB — CERVICOVAGINAL ANCILLARY ONLY
Chlamydia: NEGATIVE
Comment: NEGATIVE
Comment: NEGATIVE
Comment: NORMAL
Neisseria Gonorrhea: NEGATIVE
Trichomonas: NEGATIVE

## 2022-10-27 LAB — HCV INTERPRETATION

## 2022-10-28 LAB — CULTURE, OB URINE

## 2022-10-28 LAB — URINE CULTURE, OB REFLEX: Organism ID, Bacteria: NO GROWTH

## 2022-11-25 ENCOUNTER — Encounter: Payer: Medicaid Other | Admitting: Obstetrics & Gynecology

## 2022-11-25 ENCOUNTER — Institutional Professional Consult (permissible substitution): Payer: Medicaid Other | Admitting: Licensed Clinical Social Worker

## 2022-12-01 ENCOUNTER — Encounter: Payer: Self-pay | Admitting: Family Medicine

## 2022-12-01 ENCOUNTER — Ambulatory Visit (INDEPENDENT_AMBULATORY_CARE_PROVIDER_SITE_OTHER): Payer: Medicaid Other | Admitting: Family Medicine

## 2022-12-01 ENCOUNTER — Encounter: Payer: Self-pay | Admitting: Obstetrics

## 2022-12-01 ENCOUNTER — Institutional Professional Consult (permissible substitution): Payer: Medicaid Other | Admitting: Licensed Clinical Social Worker

## 2022-12-01 VITALS — BP 105/64 | HR 65 | Wt 166.6 lb

## 2022-12-01 DIAGNOSIS — O26899 Other specified pregnancy related conditions, unspecified trimester: Secondary | ICD-10-CM | POA: Diagnosis not present

## 2022-12-01 DIAGNOSIS — Z6791 Unspecified blood type, Rh negative: Secondary | ICD-10-CM | POA: Diagnosis not present

## 2022-12-01 DIAGNOSIS — Z3480 Encounter for supervision of other normal pregnancy, unspecified trimester: Secondary | ICD-10-CM | POA: Diagnosis not present

## 2022-12-01 MED ORDER — DICLEGIS 10-10 MG PO TBEC: 2.0000 | DELAYED_RELEASE_TABLET | Freq: Every day | ORAL | 1 refills | Status: DC | PRN

## 2022-12-01 NOTE — Progress Notes (Unsigned)
Patient presents for New OB.

## 2022-12-02 NOTE — Progress Notes (Signed)
Subjective:   Jade Brown is a 32 y.o. [id number] at [redacted]w[redacted]d by LMP being seen today for her first obstetrical visit.  Her obstetrical history is significant for  NA . Patient does intend to breast feed. Pregnancy history fully reviewed.  Patient reports nausea, no bleeding, no contractions, and no leaking.  HISTORY: OB History  Gravida Para Term Preterm AB Living  0 3 3  SAB IAB Ectopic Multiple Live Births  0 3 0 0 3    # Outcome Date GA Lbr Len/2nd Weight Sex Delivery Anes PTL Lv  7 Current           6 IAB 06/2022          5 Term 08/08/20 [redacted]w[redacted]d 08:07 5 lb 11.4 oz (2.59 kg) F Vag-Spont None  LIV     Name: [facility] [place]     Apgar1: 9  Apgar5: 9  4 Term 06/14/19 [redacted]w[redacted]d  6 lb 2.1 oz (2.78 kg) F Vag-Spont None  LIV     Name: [facility] [place]     Apgar1: 8  Apgar5: 9  3 Term 09/03/13 [redacted]w[redacted]d 04:22 / 01:05 7 lb 1.8 oz (3.225 kg) M Vag-Spont EPI  LIV     Name: [facility]     Apgar1: 8  Apgar5: 9  2 IAB 05/09/10 [redacted]w[redacted]d         1 IAB      TAB      Last pap smear was  02/18/2022 and was normal Past Medical History:  Diagnosis Date   Gallstones    Medical history non-contributory    Past Surgical History:  Procedure Laterality Date   CHOLECYSTECTOMY N/A 12/04/2013   Procedure: LAPAROSCOPIC CHOLECYSTECTOMY;  Surgeon: Axel Filler, MD;  Location: [facility] OR;  Service: General;  Laterality: N/A;   DILATION AND CURETTAGE OF UTERUS     WISDOM TOOTH EXTRACTION     Family History  Problem Relation Age of Onset   Hypertension Mother    Diabetes Mother    Diabetes Maternal Grandmother    Social History   Tobacco Use   Smoking status: Former    Types: Cigars    Quit date: 01/12/2011    Years since quitting: 11.8    Passive exposure: Past   Smokeless tobacco: Never  Vaping Use   Vaping Use: Never used  Substance Use Topics   Alcohol use: No   Drug use: Not Currently    Types: Marijuana    Comment: not since confirmed pregnancy   Allergies  Allergen Reactions    Flagyl [Metronidazole] Nausea And Vomiting   Current Outpatient Medications on File Prior to Visit  Medication Sig Dispense Refill   Blood Pressure Monitoring (BLOOD PRESSURE KIT) DEVI 1 kit by Does not apply route once a week. 1 each 0   Misc. Devices (GOJJI WEIGHT SCALE) MISC 1 Device by Does not apply route every 30 (thirty) days. 1 each 0   Prenatal Vit-Fe Fumarate-FA (MULTIVITAMIN-PRENATAL) 27-0.8 MG TABS tablet Take 1 tablet by mouth daily at 12 noon.     acetaminophen (TYLENOL) 325 MG tablet Take 2 tablets (650 mg total) by mouth every 4 (four) hours as needed (for pain scale < 4). (Patient not taking: Reported on 02/05/2020)     bisacodyl (DULCOLAX) 10 MG suppository Place 1 suppository (10 mg total) rectally daily as needed for moderate constipation. (Patient not taking: Reported on 12/01/2022) 12 suppository 0   Blood Pressure Monitoring (BLOOD PRESSURE KIT) DEVI  1 Device by Does not apply route as needed. (Patient not taking: Reported on 02/18/2022) 1 each 0   ibuprofen (ADVIL) 600 MG tablet Take 1 tablet (600 mg total) by mouth every 6 (six) hours. (Patient not taking: Reported on 12/01/2022) 30 tablet 0   metroNIDAZOLE (FLAGYL) 500 MG tablet Take 1 tablet (500 mg total) by mouth 2 (two) times daily. (Patient not taking: Reported on 12/01/2022) 14 tablet 0   No current facility-administered medications on file prior to visit.     Exam   Vitals:   12/01/22 1026  BP: 105/64  Pulse: 65  Weight: 166 lb 9.6 oz (75.6 kg)   Fetal Heart Rate (bpm): 150  Uterus:     Pelvic Exam: Perineum: no hemorrhoids, normal perineum   Vulva: normal external genitalia, no lesions   Vagina:  normal mucosa, normal discharge   Cervix: no lesions and normal, pap smear done.    Adnexa: normal adnexa and no mass, fullness, tenderness   Bony Pelvis: average  System: General: well-developed, well-nourished female in no acute distress   Breast:  normal appearance, no masses or tenderness   Skin: normal  coloration and turgor, no rashes   Neurologic: oriented, normal, negative, normal mood   Extremities: normal strength, tone, and muscle mass, ROM of all joints is normal   HEENT PERRLA, extraocular movement intact and sclera clear, anicteric   Mouth/Teeth mucous membranes moist, pharynx normal without lesions and dental hygiene good   Neck supple and no masses   Cardiovascular: regular rate and rhythm   Respiratory:  no respiratory distress, normal breath sounds   Abdomen: soft, non-tender; bowel sounds normal; no masses,  no organomegaly     Assessment:   Pregnancy: [id number] Patient Active Problem List   Diagnosis Date Noted   Supervision of other normal pregnancy, antepartum 10/26/2022   Wrist pain, chronic, right 10/31/2020   Normal labor 08/08/2020   De Quervain's tenosynovitis, right 02/18/2020   Bacterial vaginosis 06/11/2019   Rh negative status during pregnancy 12/01/2018     Plan:  1. Supervision of other normal pregnancy, antepartum Routine prenatal care.  Anatomy scan ordered.  Recommended patient continue prenatal vitamins Patient has nausea.  Diclegis ordered. - [place] MFM OB COMP + 14 WK; Future  2.  Rh- status during pregnancy in the second trimester Patient will need RhoGAM at 28 weeks and postpartum  Initial labs drawn. Continue prenatal vitamins. Genetic Screening discussed, NIPS: ordered. Ultrasound discussed; fetal anatomic survey: ordered. Problem list reviewed and updated. The nature of [place] [facility] with multiple MDs and other Advanced Practice Providers was explained to patient; also emphasized that residents, students are part of our team. Routine obstetric precautions reviewed. No follow-ups on file.

## 2022-12-08 LAB — PANORAMA PRENATAL TEST FULL PANEL:PANORAMA TEST PLUS 5 ADDITIONAL MICRODELETIONS: FETAL FRACTION: 13.1

## 2022-12-09 ENCOUNTER — Encounter: Payer: Self-pay | Admitting: Obstetrics and Gynecology

## 2022-12-09 ENCOUNTER — Telehealth (INDEPENDENT_AMBULATORY_CARE_PROVIDER_SITE_OTHER): Payer: Medicaid Other | Admitting: Obstetrics and Gynecology

## 2022-12-09 ENCOUNTER — Encounter: Payer: Self-pay | Admitting: Obstetrics

## 2022-12-09 VITALS — BP 114/60 | HR 97

## 2022-12-09 DIAGNOSIS — Z3480 Encounter for supervision of other normal pregnancy, unspecified trimester: Secondary | ICD-10-CM

## 2022-12-09 NOTE — Progress Notes (Signed)
S/w pt for virtual ROB visit. Pt requesting pregnancy accommodations letter.

## 2022-12-09 NOTE — Progress Notes (Signed)
Patient sent a direct link via text message to connect to visit @ 1615 and 1623. Provider connected x 2 episodes and patient not there. Chart closed @ 1630.   Raelyn Mora, CNM

## 2023-01-04 ENCOUNTER — Telehealth (INDEPENDENT_AMBULATORY_CARE_PROVIDER_SITE_OTHER): Payer: Self-pay | Admitting: Family Medicine

## 2023-01-04 DIAGNOSIS — Z91199 Patient's noncompliance with other medical treatment and regimen due to unspecified reason: Secondary | ICD-10-CM

## 2023-01-05 ENCOUNTER — Encounter: Payer: Self-pay | Admitting: *Deleted

## 2023-01-07 ENCOUNTER — Ambulatory Visit: Payer: Medicaid Other | Attending: Family Medicine

## 2023-01-07 ENCOUNTER — Ambulatory Visit: Payer: Medicaid Other

## 2023-01-08 NOTE — Progress Notes (Signed)
Called patient multiple times to try and start visit.  Was unable to connect.

## 2023-01-13 ENCOUNTER — Encounter: Payer: Medicaid Other | Admitting: Obstetrics

## 2023-01-17 ENCOUNTER — Telehealth: Payer: Self-pay

## 2023-01-17 NOTE — Telephone Encounter (Signed)
called to r/s missed appt

## 2023-02-16 ENCOUNTER — Encounter: Payer: Medicaid Other | Admitting: Student

## 2023-02-16 ENCOUNTER — Ambulatory Visit (INDEPENDENT_AMBULATORY_CARE_PROVIDER_SITE_OTHER): Payer: Medicaid Other | Admitting: Obstetrics and Gynecology

## 2023-02-16 VITALS — BP 98/59 | Wt 182.6 lb

## 2023-02-16 DIAGNOSIS — Z3480 Encounter for supervision of other normal pregnancy, unspecified trimester: Secondary | ICD-10-CM

## 2023-02-16 DIAGNOSIS — Z3A25 25 weeks gestation of pregnancy: Secondary | ICD-10-CM

## 2023-02-16 DIAGNOSIS — Z6791 Unspecified blood type, Rh negative: Secondary | ICD-10-CM

## 2023-02-16 DIAGNOSIS — O26892 Other specified pregnancy related conditions, second trimester: Secondary | ICD-10-CM

## 2023-02-16 NOTE — Patient Instructions (Signed)
Please do not eat or drink anything besides water before your next appointment 

## 2023-02-16 NOTE — Progress Notes (Addendum)
Pt reports bilateral upper and lower extremity swelling. Also notes headaches with vision changes. States she is working a lot. Not checking BP at home.

## 2023-02-17 NOTE — Progress Notes (Signed)
   PRENATAL VISIT NOTE  Subjective:  Jade Brown is a 32 y.o. [phone] at [redacted]w[redacted]d being seen today for ongoing prenatal care.  She is currently monitored for the following issues for this low-risk pregnancy and has Rh negative status during pregnancy and Supervision of other normal pregnancy, antepartum on their problem list.  Patient reports  more frequent headaches, blurred vision, and swelling in her hands and feet . 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 No CP/SOB. Does not take her BP when she gets symptoms. Contractions: Not present. Vag. Bleeding: None.  Movement: Present. Denies leaking of fluid.   The following portions of the patient's history were reviewed and updated as appropriate: allergies, current medications, past family history, past medical history, past social history, past surgical history and problem list.   Objective:   Vitals:   02/16/23 1148  BP: (!) 98/59  Weight: 182 lb 9.6 oz (82.8 kg)   Fetal Status: Fetal Heart Rate (bpm): 145 Fundal Height: 25 cm Movement: Present     General:  Alert, oriented and cooperative. Patient is in no acute distress.  Skin: Skin is warm and dry. No rash noted.   Cardiovascular: Normal heart rate noted  Respiratory: Normal respiratory effort, no problems with respiration noted  Abdomen: Soft, gravid, appropriate for gestational age.  Pain/Pressure: Present      Assessment and Plan:  Pregnancy: [id number] at [redacted]w[redacted]d 1. Supervision of other normal pregnancy, antepartum 2. [redacted] weeks gestation of pregnancy Needs to reschedule anatomy US Discussed hydration, compression stockings, and elevating feet for swelling. Reviewed symptoms of preeclampsia and BP cut offs. Discussed si/sxs that should prompt more urgent evaluation.  Discussed 2hGTT, thrid tri labs and tdap next appt w/ need for fasting prior to appt  3. Rh negative status during pregnancy in second trimester For rhogam next appt  Please refer to After Visit  Summary for other counseling recommendations.   Return in about 4 weeks (around 03/16/2023) for return OB at 29 weeks with 2h GTT, CBC, HIV/RPR and Tdap.  Future Appointments  Date Time Provider Department Center  02/25/2023  7:30 AM [facility] NURSE [facility]  02/25/2023  7:45 AM [facility] US6 [facility]  03/11/2023  9:15 AM [facility] LAB [facility] None  03/11/2023  9:35 AM Constant, Gigi Gin, MD [facility] None  03/24/2023 11:15 AM Hermina Staggers, MD [facility] None  04/07/2023 11:15 AM Constant, Gigi Gin, MD [facility] None    Lennart Pall, MD

## 2023-02-18 DIAGNOSIS — O99210 Obesity complicating pregnancy, unspecified trimester: Secondary | ICD-10-CM | POA: Insufficient documentation

## 2023-02-25 ENCOUNTER — Ambulatory Visit (HOSPITAL_BASED_OUTPATIENT_CLINIC_OR_DEPARTMENT_OTHER): Payer: Medicaid Other

## 2023-02-25 ENCOUNTER — Ambulatory Visit: Payer: Medicaid Other | Attending: Family Medicine | Admitting: *Deleted

## 2023-02-25 VITALS — BP 113/67 | HR 92

## 2023-02-25 DIAGNOSIS — Z363 Encounter for antenatal screening for malformations: Secondary | ICD-10-CM | POA: Insufficient documentation

## 2023-02-25 DIAGNOSIS — O360190 Maternal care for anti-D [Rh] antibodies, unspecified trimester, not applicable or unspecified: Secondary | ICD-10-CM | POA: Diagnosis not present

## 2023-02-25 DIAGNOSIS — Z3480 Encounter for supervision of other normal pregnancy, unspecified trimester: Secondary | ICD-10-CM

## 2023-02-25 DIAGNOSIS — Z3A27 27 weeks gestation of pregnancy: Secondary | ICD-10-CM | POA: Diagnosis not present

## 2023-02-25 DIAGNOSIS — E669 Obesity, unspecified: Secondary | ICD-10-CM | POA: Diagnosis not present

## 2023-02-25 DIAGNOSIS — O99212 Obesity complicating pregnancy, second trimester: Secondary | ICD-10-CM | POA: Insufficient documentation

## 2023-02-25 DIAGNOSIS — O360120 Maternal care for anti-D [Rh] antibodies, second trimester, not applicable or unspecified: Secondary | ICD-10-CM | POA: Diagnosis not present

## 2023-03-01 ENCOUNTER — Other Ambulatory Visit: Payer: Self-pay | Admitting: Family Medicine

## 2023-03-01 DIAGNOSIS — Z6791 Unspecified blood type, Rh negative: Secondary | ICD-10-CM

## 2023-03-01 DIAGNOSIS — Z3480 Encounter for supervision of other normal pregnancy, unspecified trimester: Secondary | ICD-10-CM

## 2023-03-01 DIAGNOSIS — O99212 Obesity complicating pregnancy, second trimester: Secondary | ICD-10-CM

## 2023-03-01 DIAGNOSIS — O26899 Other specified pregnancy related conditions, unspecified trimester: Secondary | ICD-10-CM

## 2023-03-09 ENCOUNTER — Ambulatory Visit (INDEPENDENT_AMBULATORY_CARE_PROVIDER_SITE_OTHER): Payer: Medicaid Other | Admitting: Obstetrics and Gynecology

## 2023-03-09 ENCOUNTER — Ambulatory Visit (INDEPENDENT_AMBULATORY_CARE_PROVIDER_SITE_OTHER): Payer: Medicaid Other | Admitting: Licensed Clinical Social Worker

## 2023-03-09 VITALS — BP 105/68 | HR 86 | Wt 176.0 lb

## 2023-03-09 DIAGNOSIS — Z3480 Encounter for supervision of other normal pregnancy, unspecified trimester: Secondary | ICD-10-CM

## 2023-03-09 DIAGNOSIS — Z3A28 28 weeks gestation of pregnancy: Secondary | ICD-10-CM

## 2023-03-09 DIAGNOSIS — Z6791 Unspecified blood type, Rh negative: Secondary | ICD-10-CM | POA: Diagnosis not present

## 2023-03-09 DIAGNOSIS — Z658 Other specified problems related to psychosocial circumstances: Secondary | ICD-10-CM

## 2023-03-09 DIAGNOSIS — F439 Reaction to severe stress, unspecified: Secondary | ICD-10-CM

## 2023-03-09 DIAGNOSIS — O361931 Maternal care for other isoimmunization, third trimester, fetus 1: Secondary | ICD-10-CM

## 2023-03-09 DIAGNOSIS — O26893 Other specified pregnancy related conditions, third trimester: Secondary | ICD-10-CM

## 2023-03-09 MED ORDER — RHO D IMMUNE GLOBULIN 1500 UNIT/2ML IJ SOSY
300.0000 ug | PREFILLED_SYRINGE | Freq: Once | INTRAMUSCULAR | Status: AC
Start: 2023-03-09 — End: 2023-03-09
  Administered 2023-03-09: 300 ug via INTRAMUSCULAR

## 2023-03-09 NOTE — Progress Notes (Addendum)
ROB.  Rhogam given in LUOG, tolerated well.  Pt declined TDap vaccine.  She will return 03/11/23 for GTT labs. C/o stress, pain, spotting.  Administrations This Visit     rho (d) immune globulin (RHIG/RHOPHYLAC) injection 300 mcg     Admin Date 03/09/2023 Action Given Dose 300 mcg Route Intramuscular Documented By Maretta Bees, RMA

## 2023-03-09 NOTE — Progress Notes (Signed)
   PRENATAL VISIT NOTE  Subjective:  Jade Brown is a 32 y.o. [phone] at [redacted]w[redacted]d being seen today for ongoing prenatal care.  She is currently monitored for the following issues for this low-risk pregnancy and has Rh negative status during pregnancy; Supervision of other normal pregnancy, antepartum; and Obesity affecting pregnancy on their problem list.  Patient reports  spotting, pressure, stress related to mold in house & tension w/ landlord, stress related to work .  Contractions: Irritability. Vag. Bleeding: Scant.  Movement: Present. Denies leaking of fluid.   The following portions of the patient's history were reviewed and updated as appropriate: allergies, current medications, past family history, past medical history, past social history, past surgical history and problem list.   Objective:   Vitals:   03/09/23 1020  BP: 105/68  Pulse: 86  Weight: 176 lb (79.8 kg)   Fetal Status: Fetal Heart Rate (bpm): 150   Movement: Present     General:  Alert, oriented and cooperative. Patient is in no acute distress.  Skin: Skin is warm and dry. No rash noted.   Cardiovascular: Normal heart rate noted  Respiratory: Normal respiratory effort, no problems with respiration noted  Abdomen: Soft, gravid, appropriate for gestational age.  Pain/Pressure: Present     Cervix visually closed, no blood, no ectropion. Performed w/ chaperone present  Assessment and Plan:  Pregnancy: [id number] at [redacted]w[redacted]d 1. Supervision of other normal pregnancy, antepartum 2. [redacted] weeks gestation of pregnancy Speculum exam unremarkable, cervix visually closed [place] 7/19 [facility] Declines tdap Will return for /3\ labs on 8/2  3. Rh negative status during pregnancy in third trimester Rhogam given today  4. Stress Referred to [facility], warm hand off given  Preterm labor symptoms and general obstetric precautions including but not limited to vaginal bleeding, contractions, leaking of fluid and fetal movement were reviewed in  detail with the patient.  Please refer to After Visit Summary for other counseling recommendations.   Return in about 2 weeks (around 03/23/2023) for return OB at 30 weeks with CNM.  Future Appointments  Date Time Provider Department Center  03/11/2023  9:15 AM [facility] LAB [facility] None  03/11/2023  9:35 AM Constant, Gigi Gin, MD [facility] None  03/24/2023 11:15 AM Hermina Staggers, MD [facility] None  04/07/2023 11:15 AM Constant, Gigi Gin, MD [facility] None   Lennart Pall, MD

## 2023-03-11 ENCOUNTER — Encounter: Payer: Medicaid Other | Admitting: Obstetrics and Gynecology

## 2023-03-11 ENCOUNTER — Other Ambulatory Visit: Payer: Medicaid Other

## 2023-03-11 DIAGNOSIS — Z3A28 28 weeks gestation of pregnancy: Secondary | ICD-10-CM

## 2023-03-15 ENCOUNTER — Ambulatory Visit: Payer: Medicaid Other

## 2023-03-15 ENCOUNTER — Other Ambulatory Visit: Payer: Medicaid Other

## 2023-03-15 DIAGNOSIS — Z3480 Encounter for supervision of other normal pregnancy, unspecified trimester: Secondary | ICD-10-CM

## 2023-03-17 NOTE — BH Specialist Note (Signed)
Integrated Behavioral Health Initial In-Person Visit  MRN: [id number] Name: Jade Brown  Number of Integrated Behavioral Health Clinician visits: 1 Session Start time: 11:00am   Session End time: 11:39am Total time in minutes: 39 mins in person at [facility]   Types of Service: General Behavioral Integrated Care (BHI)  Interpretor:No. Interpretor Name and Language: none   Warm Hand Off Completed.        Subjective: Jade Brown is a 32 y.o. female accompanied by n/a Patient was referred by Dr. Berton Lan for stressors and emotional support. Patient reports the following symptoms/concerns: stress, lack of social and emotional support  Duration of problem: approx one year; Severity of problem: mild  Objective: Mood: Depressed and Affect: Appropriate Risk of harm to self or others: No plan to harm self or others  Life Context: Family and Social: Lives with children and partner  School/Work: [employer]  Self-Care: None Life Changes: New pregnancy  Patient and/or Family's Strengths/Protective Factors: Concrete supports in place (healthy food, safe environments, etc.)  Goals Addressed: Patient will: Reduce symptoms of: depression and stress Increase knowledge and/or ability of: coping skills and stress reduction  Demonstrate ability to: Increase healthy adjustment to current life circumstances  Progress towards Goals: Ongoing  Interventions: Interventions utilized: Motivational Interviewing and Supportive Counseling  Standardized Assessments completed: PHQ 9  Patient and/or Family Response: Ms. Eickhoff reports lack of social and emotional support from partner. Ms. Starling reports increase level of stress at work and home has mold. Ms. Mishoe is looking for  new apartment.    Assessment: Patient currently experiencing psychosocial stress.   Patient may benefit from integrated behavioral health.  Plan: Follow up with behavioral health clinician on : 2-3 weeks  Behavioral  recommendations: Contact legal aid to assistance with options regarding mold in home, communicate with hr representative at work stress Referral(s): Integrated [employer] (In Clinic) "From scale of 1-10, how likely are you to follow plan?":    Gwyndolyn Saxon, LCSW

## 2023-03-24 ENCOUNTER — Encounter: Payer: Medicaid Other | Admitting: Obstetrics and Gynecology

## 2023-04-07 ENCOUNTER — Encounter: Payer: Medicaid Other | Admitting: Obstetrics and Gynecology

## 2023-04-25 ENCOUNTER — Telehealth: Payer: Medicaid Other | Admitting: Obstetrics & Gynecology

## 2023-05-10 ENCOUNTER — Ambulatory Visit: Payer: Medicaid Other | Admitting: Family Medicine

## 2023-05-10 ENCOUNTER — Other Ambulatory Visit (HOSPITAL_COMMUNITY)
Admission: RE | Admit: 2023-05-10 | Discharge: 2023-05-10 | Disposition: A | Discharge status: Home / Self Care | Payer: Medicaid Other | Source: Ambulatory Visit | Attending: Obstetrics and Gynecology | Admitting: Obstetrics and Gynecology

## 2023-05-10 VITALS — BP 98/70 | HR 77 | Wt 186.9 lb

## 2023-05-10 DIAGNOSIS — N760 Acute vaginitis: Secondary | ICD-10-CM

## 2023-05-10 DIAGNOSIS — Z3A37 37 weeks gestation of pregnancy: Secondary | ICD-10-CM

## 2023-05-10 DIAGNOSIS — Z3493 Encounter for supervision of normal pregnancy, unspecified, third trimester: Secondary | ICD-10-CM | POA: Diagnosis present

## 2023-05-10 DIAGNOSIS — O99212 Obesity complicating pregnancy, second trimester: Secondary | ICD-10-CM

## 2023-05-10 DIAGNOSIS — O26893 Other specified pregnancy related conditions, third trimester: Secondary | ICD-10-CM

## 2023-05-10 DIAGNOSIS — Z3480 Encounter for supervision of other normal pregnancy, unspecified trimester: Secondary | ICD-10-CM

## 2023-05-10 DIAGNOSIS — B3731 Acute candidiasis of vulva and vagina: Secondary | ICD-10-CM

## 2023-05-10 DIAGNOSIS — Z6791 Unspecified blood type, Rh negative: Secondary | ICD-10-CM

## 2023-05-10 DIAGNOSIS — B9689 Other specified bacterial agents as the cause of diseases classified elsewhere: Secondary | ICD-10-CM

## 2023-05-10 NOTE — Progress Notes (Addendum)
   PRENATAL VISIT NOTE  Subjective:  Jade Brown is a 32 y.o. [phone] at [redacted]w[redacted]d being seen today for ongoing prenatal care.  She is currently monitored for the following issues for this low-risk pregnancy and has Rh negative status during pregnancy; Supervision of other normal pregnancy, antepartum; and Obesity affecting pregnancy on their problem list.  Patient doing well with no acute concerns today. She reports  braxton hicks contractions .  Contractions: Irritability. Vag. Bleeding: None.  Movement: Present. Denies leaking of fluid.   The following portions of the patient's history were reviewed and updated as appropriate: allergies, current medications, past family history, past medical history, past social history, past surgical history and problem list. Problem list updated.  Objective:   Vitals:   05/10/23 0907  BP: 98/70  Pulse: 77  Weight: 186 lb 14.4 oz (84.8 kg)    Fetal Status: Fetal Heart Rate (bpm): 138 Fundal Height: 37 cm Movement: Present     General:  Alert, oriented and cooperative. Patient is in no acute distress.  Skin: Skin is warm and dry. No rash noted.   Cardiovascular: Normal heart rate noted  Respiratory: Normal respiratory effort, no problems with respiration noted  Abdomen: Soft, gravid, appropriate for gestational age.  Pain/Pressure: Present     Pelvic: Cervical exam performed Dilation: 1 Effacement (%): 50 Station: -3  Extremities: Normal range of motion.  Edema: Trace  Mental Status:  Normal mood and affect. Normal behavior. Normal judgment and thought content.   Assessment and Plan:  Pregnancy: [id number] at [redacted]w[redacted]d  1. [redacted] weeks gestation of pregnancy - Culture, beta strep (group b only) - Cervicovaginal ancillary only( [place])  2. Rh negative status during pregnancy in third trimester -s/p Rhogam 03/09/23  3. Supervision of other normal pregnancy, antepartum  4. Obesity affecting pregnancy in second trimester, unspecified obesity  type    Term labor symptoms and general obstetric precautions including but not limited to vaginal bleeding, contractions, leaking of fluid and fetal movement were reviewed in detail with the patient.  Please refer to After Visit Summary for other counseling recommendations.   Return in about 1 week (around 05/17/2023) for ROB with Midwife .   Mittie Bodo, MD Family Medicine - Obstetrics Fellow

## 2023-05-11 LAB — CERVICOVAGINAL ANCILLARY ONLY
Bacterial Vaginitis (gardnerella): POSITIVE — AB
Candida Glabrata: NEGATIVE
Candida Vaginitis: POSITIVE — AB
Chlamydia: NEGATIVE
Comment: NEGATIVE
Comment: NEGATIVE
Comment: NEGATIVE
Comment: NEGATIVE
Comment: NEGATIVE
Comment: NORMAL
Neisseria Gonorrhea: NEGATIVE
Trichomonas: NEGATIVE

## 2023-05-11 MED ORDER — FLUCONAZOLE 150 MG PO TABS: 150.0000 mg | ORAL_TABLET | ORAL | 1 refills | Status: DC | PRN

## 2023-05-11 MED ORDER — METRONIDAZOLE 500 MG PO TABS
500.0000 mg | ORAL_TABLET | Freq: Two times a day (BID) | ORAL | 0 refills | Status: DC
Start: 2023-05-11 — End: 2023-05-19

## 2023-05-11 NOTE — Addendum Note (Signed)
Addended by: Mittie Bodo on: 05/11/2023 03:29 PM   Modules accepted: Orders

## 2023-05-13 LAB — CULTURE, BETA STREP (GROUP B ONLY): Strep Gp B Culture: POSITIVE — AB

## 2023-05-17 ENCOUNTER — Encounter (HOSPITAL_COMMUNITY): Payer: Self-pay | Admitting: Obstetrics and Gynecology

## 2023-05-17 ENCOUNTER — Ambulatory Visit (INDEPENDENT_AMBULATORY_CARE_PROVIDER_SITE_OTHER): Payer: Medicaid Other | Admitting: Family Medicine

## 2023-05-17 ENCOUNTER — Inpatient Hospital Stay (HOSPITAL_COMMUNITY)
Admission: AD | Admit: 2023-05-17 | Discharge: 2023-05-19 | DRG: 807 | Disposition: A | Discharge status: Home / Self Care | Payer: Medicaid Other | Attending: Family Medicine | Admitting: Family Medicine

## 2023-05-17 VITALS — BP 121/76 | HR 91 | Wt 187.0 lb

## 2023-05-17 DIAGNOSIS — O479 False labor, unspecified: Secondary | ICD-10-CM | POA: Diagnosis present

## 2023-05-17 DIAGNOSIS — O99820 Streptococcus B carrier state complicating pregnancy: Secondary | ICD-10-CM | POA: Diagnosis not present

## 2023-05-17 DIAGNOSIS — O99824 Streptococcus B carrier state complicating childbirth: Secondary | ICD-10-CM | POA: Diagnosis present

## 2023-05-17 DIAGNOSIS — Z8249 Family history of ischemic heart disease and other diseases of the circulatory system: Secondary | ICD-10-CM | POA: Diagnosis not present

## 2023-05-17 DIAGNOSIS — O99214 Obesity complicating childbirth: Secondary | ICD-10-CM | POA: Diagnosis not present

## 2023-05-17 DIAGNOSIS — Z3480 Encounter for supervision of other normal pregnancy, unspecified trimester: Secondary | ICD-10-CM | POA: Diagnosis not present

## 2023-05-17 DIAGNOSIS — Z6791 Unspecified blood type, Rh negative: Secondary | ICD-10-CM

## 2023-05-17 DIAGNOSIS — Z833 Family history of diabetes mellitus: Secondary | ICD-10-CM | POA: Diagnosis not present

## 2023-05-17 DIAGNOSIS — Z3A38 38 weeks gestation of pregnancy: Secondary | ICD-10-CM

## 2023-05-17 DIAGNOSIS — O26899 Other specified pregnancy related conditions, unspecified trimester: Secondary | ICD-10-CM

## 2023-05-17 DIAGNOSIS — O26893 Other specified pregnancy related conditions, third trimester: Principal | ICD-10-CM | POA: Diagnosis present

## 2023-05-17 DIAGNOSIS — Z23 Encounter for immunization: Secondary | ICD-10-CM | POA: Diagnosis not present

## 2023-05-17 DIAGNOSIS — O9902 Anemia complicating childbirth: Secondary | ICD-10-CM | POA: Diagnosis present

## 2023-05-17 DIAGNOSIS — Z87891 Personal history of nicotine dependence: Secondary | ICD-10-CM

## 2023-05-17 DIAGNOSIS — Z881 Allergy status to other antibiotic agents status: Secondary | ICD-10-CM | POA: Diagnosis not present

## 2023-05-17 DIAGNOSIS — O99212 Obesity complicating pregnancy, second trimester: Secondary | ICD-10-CM

## 2023-05-17 DIAGNOSIS — O99210 Obesity complicating pregnancy, unspecified trimester: Secondary | ICD-10-CM | POA: Diagnosis present

## 2023-05-17 LAB — TYPE AND SCREEN
ABO/RH(D): A NEG
Antibody Screen: POSITIVE

## 2023-05-17 LAB — CBC
HCT: 32.7 % — ABNORMAL LOW (ref 36.0–46.0)
Hemoglobin: 10.9 g/dL — ABNORMAL LOW (ref 12.0–15.0)
MCH: 30.7 pg (ref 26.0–34.0)
MCHC: 33.3 g/dL (ref 30.0–36.0)
MCV: 92.1 fL (ref 80.0–100.0)
Platelets: 284 10*3/uL (ref 150–400)
RBC: 3.55 MIL/uL — ABNORMAL LOW (ref 3.87–5.11)
RDW: 13.5 % (ref 11.5–15.5)
WBC: 14.7 10*3/uL — ABNORMAL HIGH (ref 4.0–10.5)
nRBC: 0 % (ref 0.0–0.2)

## 2023-05-17 LAB — RPR: RPR Ser Ql: NONREACTIVE

## 2023-05-17 MED ORDER — SODIUM CHLORIDE 0.9% FLUSH: 3.0000 mL | Freq: Two times a day (BID) | INTRAVENOUS | Status: DC

## 2023-05-17 MED ORDER — ONDANSETRON HCL 4 MG PO TABS: 4.0000 mg | ORAL_TABLET | ORAL | Status: DC | PRN

## 2023-05-17 MED ORDER — OXYTOCIN-SODIUM CHLORIDE 30-0.9 UT/500ML-% IV SOLN
2.5000 [IU]/h | INTRAVENOUS | Status: DC
  Filled 2023-05-17: qty 500

## 2023-05-17 MED ORDER — SODIUM CHLORIDE 0.9 % IV SOLN: 1.0000 g | INTRAVENOUS | Status: DC

## 2023-05-17 MED ORDER — PRENATAL MULTIVITAMIN CH
1.0000 | ORAL_TABLET | Freq: Every day | ORAL | Status: DC
  Administered 2023-05-18 – 2023-05-19 (×2): 1 via ORAL
  Filled 2023-05-17 (×2): qty 1

## 2023-05-17 MED ORDER — DOCUSATE SODIUM 100 MG PO CAPS
100.0000 mg | ORAL_CAPSULE | Freq: Two times a day (BID) | ORAL | Status: DC
  Administered 2023-05-18: 100 mg via ORAL
  Filled 2023-05-17 (×3): qty 1

## 2023-05-17 MED ORDER — ACETAMINOPHEN 325 MG PO TABS: 650.0000 mg | ORAL_TABLET | ORAL | Status: DC | PRN

## 2023-05-17 MED ORDER — WITCH HAZEL-GLYCERIN EX PADS: 1.0000 | MEDICATED_PAD | CUTANEOUS | Status: DC | PRN

## 2023-05-17 MED ORDER — SODIUM CHLORIDE 0.9 % IV SOLN
2.0000 g | Freq: Once | INTRAVENOUS | Status: AC
  Administered 2023-05-17: 2 g via INTRAVENOUS
  Filled 2023-05-17: qty 2000

## 2023-05-17 MED ORDER — OXYTOCIN BOLUS FROM INFUSION
333.0000 mL | Freq: Once | INTRAVENOUS | Status: AC
  Administered 2023-05-17: 333 mL via INTRAVENOUS

## 2023-05-17 MED ORDER — ACETAMINOPHEN 325 MG PO TABS
650.0000 mg | ORAL_TABLET | ORAL | Status: DC | PRN
  Administered 2023-05-18: 650 mg via ORAL
  Filled 2023-05-17: qty 2

## 2023-05-17 MED ORDER — OXYCODONE HCL 5 MG PO TABS
5.0000 mg | ORAL_TABLET | ORAL | Status: DC | PRN
  Administered 2023-05-17 – 2023-05-18 (×3): 5 mg via ORAL
  Filled 2023-05-17 (×3): qty 1

## 2023-05-17 MED ORDER — SODIUM CHLORIDE 0.9% FLUSH: 3.0000 mL | INTRAVENOUS | Status: DC | PRN

## 2023-05-17 MED ORDER — OXYCODONE HCL 5 MG PO TABS
10.0000 mg | ORAL_TABLET | ORAL | Status: DC | PRN
  Administered 2023-05-18 (×2): 10 mg via ORAL
  Filled 2023-05-17 (×2): qty 2

## 2023-05-17 MED ORDER — SIMETHICONE 80 MG PO CHEW: 80.0000 mg | CHEWABLE_TABLET | ORAL | Status: DC | PRN

## 2023-05-17 MED ORDER — ONDANSETRON HCL 4 MG/2ML IJ SOLN: 4.0000 mg | Freq: Four times a day (QID) | INTRAMUSCULAR | Status: DC | PRN

## 2023-05-17 MED ORDER — LIDOCAINE HCL (PF) 1 % IJ SOLN: 30.0000 mL | INTRAMUSCULAR | Status: DC | PRN

## 2023-05-17 MED ORDER — OXYCODONE-ACETAMINOPHEN 5-325 MG PO TABS: 2.0000 | ORAL_TABLET | ORAL | Status: DC | PRN

## 2023-05-17 MED ORDER — OXYCODONE-ACETAMINOPHEN 5-325 MG PO TABS
1.0000 | ORAL_TABLET | ORAL | Status: DC | PRN
  Administered 2023-05-17: 1 via ORAL
  Filled 2023-05-17: qty 1

## 2023-05-17 MED ORDER — SOD CITRATE-CITRIC ACID 500-334 MG/5ML PO SOLN: 30.0000 mL | ORAL | Status: DC | PRN

## 2023-05-17 MED ORDER — COCONUT OIL OIL: 1.0000 | TOPICAL_OIL | Status: DC | PRN

## 2023-05-17 MED ORDER — FENTANYL CITRATE (PF) 100 MCG/2ML IJ SOLN
50.0000 ug | Freq: Once | INTRAMUSCULAR | Status: AC
  Administered 2023-05-17: 50 ug via INTRAVENOUS

## 2023-05-17 MED ORDER — LACTATED RINGERS IV SOLN: 500.0000 mL | INTRAVENOUS | Status: DC | PRN

## 2023-05-17 MED ORDER — IBUPROFEN 600 MG PO TABS
600.0000 mg | ORAL_TABLET | Freq: Four times a day (QID) | ORAL | Status: DC
  Administered 2023-05-17 – 2023-05-19 (×8): 600 mg via ORAL
  Filled 2023-05-17 (×8): qty 1

## 2023-05-17 MED ORDER — ONDANSETRON HCL 4 MG/2ML IJ SOLN
4.0000 mg | INTRAMUSCULAR | Status: DC | PRN
  Administered 2023-05-18: 4 mg via INTRAVENOUS
  Filled 2023-05-17: qty 2

## 2023-05-17 MED ORDER — DIPHENHYDRAMINE HCL 25 MG PO CAPS: 25.0000 mg | ORAL_CAPSULE | Freq: Four times a day (QID) | ORAL | Status: DC | PRN

## 2023-05-17 MED ORDER — FENTANYL CITRATE (PF) 100 MCG/2ML IJ SOLN
INTRAMUSCULAR | Status: AC
  Filled 2023-05-17: qty 2

## 2023-05-17 MED ORDER — SODIUM CHLORIDE 0.9 % IV SOLN: 250.0000 mL | INTRAVENOUS | Status: DC | PRN

## 2023-05-17 MED ORDER — BENZOCAINE-MENTHOL 20-0.5 % EX AERO: 1.0000 | INHALATION_SPRAY | CUTANEOUS | Status: DC | PRN

## 2023-05-17 MED ORDER — DIBUCAINE (PERIANAL) 1 % EX OINT: 1.0000 | TOPICAL_OINTMENT | CUTANEOUS | Status: DC | PRN

## 2023-05-17 NOTE — Progress Notes (Signed)
Called to room. Patient is feeling pressure and more intense contractions.  Laboring in tub. Coping well. Patient is trying to wait for partner S/p amipicillin infusions.   FHR appropriate on intermittent monitoring.  Mother is sure infant will come any minute  Awaiting waterbirth.   Federico Flake, MD

## 2023-05-17 NOTE — Progress Notes (Signed)
Pt presents for ROB visit. Pt c/o contraction every 5 mins, pressure and leaking fluid this morning.

## 2023-05-17 NOTE — Progress Notes (Signed)
Patient on LD. More uncomfortable with contractions. Coping well with breathing Desires to get into the water.  Discussed r/b of water birth Reviewed that she maybe asked to leave the tub is needed and agrees to do so  Consent signed. RN to fill tub  OK for intermittent monitoring  Federico Flake, MD

## 2023-05-17 NOTE — H&P (Addendum)
OBSTETRIC ADMISSION HISTORY AND PHYSICAL  Jade Brown is a 32 y.o. female [phone] with IUP at [redacted]w[redacted]d by LMP presenting for contractions/labor. She reports having ctx for the past day. She reports +FMs- slightly decreased this AM but now normal.   No LOF, no VB, no blurry vision, headaches or peripheral edema, and RUQ pain.  She plans on breast feeding. She request undecided for birth control. She received her prenatal care at  [facility]    Dating: By LMP --->  Estimated Date of Delivery: 05/27/23  Sono:   @[redacted]w[redacted]d , CWD, normal anatomy, cephalic presentation,  971g, 69% EFW   Prenatal History/Complications:  Patient Active Problem List   Diagnosis Date Noted   Obesity affecting pregnancy 02/18/2023   Supervision of other normal pregnancy, antepartum 10/26/2022   Rh negative status during pregnancy 12/01/2018    Past Medical History: Past Medical History:  Diagnosis Date   Bacterial vaginosis 06/11/2019   De Quervain's tenosynovitis, right 02/18/2020   Gallstones    Medical history non-contributory    Normal labor 08/08/2020   Wrist pain, chronic, right 10/31/2020    Past Surgical History: Past Surgical History:  Procedure Laterality Date   CHOLECYSTECTOMY N/A 12/04/2013   Procedure: LAPAROSCOPIC CHOLECYSTECTOMY;  Surgeon: Axel Filler, MD;  Location: [facility] OR;  Service: General;  Laterality: N/A;   DILATION AND CURETTAGE OF UTERUS     WISDOM TOOTH EXTRACTION      Obstetrical History: OB History     Gravida  7   Para  3   Term  3   Preterm      AB  3   Living  3      SAB      IAB  3   Ectopic      Multiple  0   Live Births  3           Social History Social History   Socioeconomic History   Marital status: Single    Spouse name: Not on file   Number of children: Not on file   Years of education: Not on file   Highest education level: Not on file  Occupational History   Not on file  Tobacco Use   Smoking status: Former    Types: Cigars     Quit date: 01/12/2011    Years since quitting: 12.3    Passive exposure: Past   Smokeless tobacco: Never  Vaping Use   Vaping status: Never Used  Substance and Sexual Activity   Alcohol use: No   Drug use: Not Currently    Types: Marijuana    Comment: not since confirmed pregnancy   Sexual activity: Yes    Partners: Male    Birth control/protection: None  Other Topics Concern   Not on file  Social History Narrative   Not on file   Social Determinants of Health   Financial Resource Strain: Not on file  Food Insecurity: Not on file  Transportation Needs: Not on file  Physical Activity: Not on file  Stress: Not on file  Social Connections: Not on file    Family History: Family History  Problem Relation Age of Onset   Hypertension Mother    Diabetes Mother    Diabetes Maternal Grandmother     Allergies: Allergies  Allergen Reactions   Flagyl [Metronidazole] Nausea And Vomiting    Medications Prior to Admission  Medication Sig Dispense Refill Last Dose   Prenatal Vit-Fe Fumarate-FA (MULTIVITAMIN-PRENATAL) 27-0.8 MG TABS tablet Take 1 tablet  by mouth daily at 12 noon.   Past Week   acetaminophen (TYLENOL) 325 MG tablet Take 2 tablets (650 mg total) by mouth every 4 (four) hours as needed (for pain scale < 4). (Patient not taking: Reported on 05/10/2023)      Blood Pressure Monitoring (BLOOD PRESSURE KIT) DEVI 1 Device by Does not apply route as needed. (Patient not taking: Reported on 02/18/2022) 1 each 0    Blood Pressure Monitoring (BLOOD PRESSURE KIT) DEVI 1 kit by Does not apply route once a week. 1 each 0    Doxylamine-Pyridoxine (DICLEGIS) 10-10 MG TBEC Take 2-4 tablets by mouth daily as needed. Start: 2 tabs at night, if sx persist after 2 days increase to 1 tab in AM and 2 tab PM, May further increase to 1 tab AM, 1 tab mid-day, and 2 tab PM. Give on empty stomach. 60 tablet 1    fluconazole (DIFLUCAN) 150 MG tablet Take 1 tablet (150 mg total) by mouth every 3 (three)  days as needed (vaginal itching). Can take additional dose three days later if symptoms persist 2 tablet 1    metroNIDAZOLE (FLAGYL) 500 MG tablet Take 1 tablet (500 mg total) by mouth 2 (two) times daily. 14 tablet 0    Misc. Devices (GOJJI WEIGHT SCALE) MISC 1 Device by Does not apply route every 30 (thirty) days. (Patient not taking: Reported on 02/16/2023) 1 each 0      Review of Systems   All systems reviewed and negative except as stated in HPI  Blood pressure 107/66, pulse 98, temperature 98.1 F (36.7 C), temperature source Oral, resp. rate 14, last menstrual period 08/20/2022, SpO2 99%. General appearance: alert, cooperative, and appears stated age Lungs: clear to auscultation bilaterally Heart: regular rate and rhythm Abdomen: soft, non-tender; bowel sounds normal Pelvic: Adequate Extremities: Homans sign is negative, no sign of DVT DTR's WNL Presentation: cephalic Fetal monitoringBaseline: 120 bpm, Variability: Good {> 6 bpm), Accelerations: Reactive, and Decelerations: Absent Uterine activity- every 3-4 minutes, palpable Dilation: 6 Effacement (%): 60 Station: -2 Exam by:: Georgina Snell, RN   Prenatal labs: ABO, Rh: A/Negative/-- (03/19 1446) Antibody: Negative (03/19 1446) Rubella: 1.22 (03/19 1446) RPR: Non Reactive (08/06 0923)  HBsAg: Negative (03/19 1446)  HIV: Non Reactive (08/06 0923)  GBS: Positive/-- (10/01 1017)  2 hr Glucola WNL Genetic screening  NIP LR Anatomy US WNL  Prenatal Transfer Tool  Maternal Diabetes: No Genetic Screening: Normal Maternal Ultrasounds/Referrals: Normal Fetal Ultrasounds or other Referrals:  None Maternal Substance Abuse:  No Significant Maternal Medications:  None Significant Maternal Lab Results:  Group B Strep positive Number of Prenatal Visits:greater than 3 verified prenatal visits Other Comments:   No RSV vaccine in pregnancy  No results found for this or any previous visit (from the past 24  hour(s)).  Patient Active Problem List   Diagnosis Date Noted   Obesity affecting pregnancy 02/18/2023   Supervision of other normal pregnancy, antepartum 10/26/2022   Rh negative status during pregnancy 12/01/2018    Assessment/Plan:  DEANA KROCK is a 32 y.o. J4N8295 at [redacted]w[redacted]d here for SOL  #Labor:progressing normally. If labor stalls would consider AROM after adequate GBS treatment.  #Pain: Prior unmedicated birth x 2. Planning WB. She took the class. No contraindication to immersion. Reviewed reasons that might develop in labor. Will sign WB consent  on LD #FWB:  Cat 1. Intermittent monitoring in labor.  #ID:  GBS POS- Ampicillin ordered #MOF:  breast #MOC: unsure #Circ:  NA  girl  #Rh Negative: rhogam post delivery if indicated  Federico Flake, MD  05/17/2023, 12:39 PM

## 2023-05-17 NOTE — Discharge Summary (Shared)
Postpartum Discharge Summary  Date of Service updated***     Patient Name: Jade Brown DOB: 1990-12-10 MRN: [id number]  Date of admission: 05/17/2023 Delivery date:05/17/2023 Delivering provider: Federico Flake Date of discharge: 05/17/2023  Admitting diagnosis: Uterine contractions [O47.9] Intrauterine pregnancy: [redacted]w[redacted]d     Secondary diagnosis:  Principal Problem:   SVD (spontaneous vaginal delivery) Active Problems:   Rh negative status during pregnancy   Supervision of other normal pregnancy, antepartum   Obesity affecting pregnancy   Uterine contractions  Additional problems: ***    Discharge diagnosis: {DX.:23714}                                              Post partum procedures:{Postpartum procedures:23558} Augmentation: {Augmentation:20782} Complications: {OB Labor/Delivery Complications:20784}  Hospital course: Onset of Labor With Vaginal Delivery      32 y.o. yo [id number] at [redacted]w[redacted]d was admitted in Active Labor on 05/17/2023. Labor course was complicated by Rh Negative status.    Membrane Rupture Time/Date: 3:00 PM,05/17/2023  Delivery Method:Vaginal, Spontaneous- in waterbirth tub Operative Delivery:N/A Episiotomy: None Lacerations:  None Patient had a postpartum course complicated by ***.  She is ambulating, tolerating a regular diet, passing flatus, and urinating well. Patient is discharged home in stable condition on 05/17/23.  Newborn Data: Birth date:05/17/2023 Birth time:3:21 PM Gender:Female Living status:Living Apgars:8 ,9  Weight:3050 g  Magnesium Sulfate received: {Mag received:30440022} BMZ received: {BMZ received:30440023} Rhophylac:{Rhophylac received:30440032} JYN:{WGN:56213086} T-DaP:{Tdap:23962} Flu: {VHQ:46962} RSV Vaccine received: {RSV:31013} Transfusion:{Transfusion received:30440034}  Immunizations administered: Immunization History  Administered Date(s) Administered   MMR 09/05/2013   Rho (D) Immune Globulin 06/11/2013    Tdap 09/03/2013, 03/21/2019    Physical exam  Vitals:   05/17/23 1547 05/17/23 1600 05/17/23 1617 05/17/23 1631  BP: 118/62 125/62 (!) 119/56 117/70  Pulse: 80 83 77 65  Resp:      Temp:      TempSrc:      SpO2:      Weight:      Height:       General: {Exam; general:21111117} Lochia: {Desc; appropriate/inappropriate:30686::"appropriate"} Uterine Fundus: {Desc; firm/soft:30687} Incision: {Exam; incision:21111123} DVT Evaluation: {Exam; dvt:2111122} Labs: Lab Results  Component Value Date   WBC 14.7 (H) 05/17/2023   HGB 10.9 (L) 05/17/2023   HCT 32.7 (L) 05/17/2023   MCV 92.1 05/17/2023   PLT 284 05/17/2023      Latest Ref Rng & Units 01/04/2020   11:18 AM  CMP  Glucose 70 - 99 mg/dL 93   BUN 6 - 20 mg/dL 6   Creatinine 9.52 - 8.41 mg/dL 3.24   Sodium 401 - 027 mmol/L 137   Potassium 3.5 - 5.1 mmol/L 3.9   Chloride 98 - 111 mmol/L 105   CO2 22 - 32 mmol/L 23   Calcium 8.9 - 10.3 mg/dL 9.0   Total Protein 6.5 - 8.1 g/dL 6.4   Total Bilirubin 0.3 - 1.2 mg/dL 0.6   Alkaline Phos 38 - 126 U/L 69   AST 15 - 41 U/L 17   ALT 0 - 44 U/L 13    Edinburgh Score:    08/09/2020   10:04 AM  Edinburgh Postnatal Depression Scale Screening Tool  I have been able to laugh and see the funny side of things. 0  I have looked forward with enjoyment to things. 0  I have blamed myself unnecessarily  when things went wrong. 1  I have been anxious or worried for no good reason. 0  I have felt scared or panicky for no good reason. 0  Things have been getting on top of me. 1  I have been so unhappy that I have had difficulty sleeping. 0  I have felt sad or miserable. 0  I have been so unhappy that I have been crying. 0  The thought of harming myself has occurred to me. 0  Edinburgh Postnatal Depression Scale Total 2      After visit meds:  Allergies as of 05/17/2023       Reactions   Flagyl [metronidazole] Nausea And Vomiting     Med Rec must be completed prior to using  this [facility]***        Discharge home in stable condition Infant Feeding: {Baby feeding:23562} Infant Disposition:{CHL IP OB HOME WITH ZOXWRU:04540} Discharge instruction: per After Visit Summary and Postpartum booklet. Activity: Advance as tolerated. Pelvic rest for 6 weeks.  Diet: {OB diet:21111121} Anticipated Birth Control: {Birth Control:23956} Postpartum Appointment:{Outpatient follow up:23559} Additional Postpartum F/U: {PP Procedure:23957} Future Appointments: Future Appointments  Date Time Provider Department Center  05/23/2023  9:35 AM Leftwich-Kirby, Wilmer Floor, CNM [facility] None   Follow up Visit:      05/17/2023 Federico Flake, MD

## 2023-05-17 NOTE — Progress Notes (Signed)
   PRENATAL VISIT NOTE  Subjective:  Jade Brown is a 32 y.o. [phone] at [redacted]w[redacted]d being seen today for ongoing prenatal care.  She is currently monitored for the following issues for this low-risk pregnancy and has Rh negative status during pregnancy; Supervision of other normal pregnancy, antepartum; and Obesity affecting pregnancy on their problem list.  Patient doing well, but reports started having ctx 10/7 but tried to sleep through them.  She did not rest well and when she awoke she was wet.  States no trickle of fluid since, just more pressure and having to use the bathroom more often.  She did say the ctx have gotten more intense and having them q82mins now.  Contractions: Irregular. Vag. Bleeding: None.  Movement: (!) Decreased.   The following portions of the patient's history were reviewed and updated as appropriate: allergies, current medications, past family history, past medical history, past social history, past surgical history and problem list. Problem list updated.  Objective:   Vitals:   05/17/23 1028  BP: 121/76  Pulse: 91  Weight: 187 lb (84.8 kg)    Fetal Status: Fetal Heart Rate (bpm): 135   Movement: (!) Decreased  Presentation: Vertex  General:  Alert, oriented and cooperative. Patient is in no acute distress.  Skin: Skin is warm and dry. No rash noted.   Cardiovascular: Normal heart rate noted  Respiratory: Normal respiratory effort, no problems with respiration noted  Abdomen: Soft, gravid, appropriate for gestational age.  Pain/Pressure: Present     Pelvic: Cervical exam performed Dilation: 4 Effacement (%): 50 Station: -2  Extremities: Normal range of motion.  Edema: None  Mental Status:  Normal mood and affect. Normal behavior. Normal judgment and thought content.   Assessment and Plan:  Pregnancy: [id number] at [redacted]w[redacted]d  Called L&D charge and due to busy floor best for patient to present to MAU for further evaluation given ctx with cervical change and DFM. MAU  provider and L&D Charge made aware.   1. [redacted] weeks gestation of pregnancy - r/o rupture, negative pooling, neg fern, neg Amniosure and Nitrazine testing - significant cervical change    2. Supervision of other normal pregnancy, antepartum  3. Rh negative status during pregnancy in third trimester - PP la  4. Obesity affecting pregnancy in second trimester, unspecified obesity type  5. Uterine contractions   Term labor symptoms and general obstetric precautions including but not limited to vaginal bleeding, contractions, leaking of fluid and fetal movement were reviewed in detail with the patient.  Please refer to After Visit Summary for other counseling recommendations.   Return in about 1 week (around 05/24/2023) for ROB if not delivered .  Mittie Bodo, MD Family Medicine - Obstetrics Fellow

## 2023-05-17 NOTE — Lactation Note (Addendum)
This note was copied from a baby's chart. Lactation Consultation Note  Patient Name: Jade Brown [id number] Date: 05/17/2023 Age:31 hours Reason for consult: Initial assessment;Early term 37-38.6wks Per MOB, infant is breastfeeding well most feedings are 15 to 20 minutes in length, LC entered the room, MOB had infant latched in the cradle hold, LC gave pillow for better support and to bring infant in alignment with breast. Infant was still breastfeeding after 20 minutes when LC left the room. MOB will continue to BF infant according to hunger cues, on demand, every 2 to 3 hours, skin to skin. LC discussed the importance of maternal rest, diet and hydration. MOB was made aware of O/P services, breastfeeding support groups, community resources, and our phone # for post-discharge questions.   Infant had one void and one stool since birth.  LC placed MOB in PRN status for LC services MOB is experienced with breastfeeding.   Maternal Data Has patient been taught Hand Expression?: Yes Does the patient have breastfeeding experience prior to this delivery?: Yes How long did the patient breastfeed?: Per MOB, 1st and 2nd child BF for 6 months, 3rd child BF for 10 months who is currently 12 years old.  Feeding Mother's Current Feeding Choice: Breast Milk  LATCH Score Latch: Grasps breast easily, tongue down, lips flanged, rhythmical sucking.  Audible Swallowing: A few with stimulation  Type of Nipple: Everted at rest and after stimulation  Comfort (Breast/Nipple): Soft / non-tender  Hold (Positioning): No assistance needed to correctly position infant at breast. (LC gave pillow for BF support to help bring infant in aligment with breast.)  LATCH Score: 9   Lactation Tools Discussed/Used    Interventions Interventions: Breast feeding basics reviewed;Skin to skin;Support pillows;Education;LC Services brochure  Discharge Pump: DEBP;Personal  Consult Status Consult Status: PRN Date:  05/17/23 Follow-up type: In-patient    Frederico Hamman 05/17/2023, 10:55 PM

## 2023-05-17 NOTE — MAU Note (Signed)
.  Jade Brown is a 32 y.o. at [redacted]w[redacted]d here in MAU reporting: Sent over from office for NST and cervical recheck. Patient presented to the office for a rule out rupture initially as well as CTX's. Patient was 450-2. Patient reported DFM to the office but reports she is now feeling normal fetal movements. Denies VB.  Last ate last night around 2100.  Onset of complaint: Today Pain score: 9/10 lower abdomen FHT: 135 initial external Lab orders placed from triage: MAU Labor Eval

## 2023-05-18 MED ORDER — RHO D IMMUNE GLOBULIN 1500 UNIT/2ML IJ SOSY
300.0000 ug | PREFILLED_SYRINGE | Freq: Once | INTRAMUSCULAR | Status: AC
  Administered 2023-05-18: 300 ug via INTRAVENOUS
  Filled 2023-05-18: qty 2

## 2023-05-18 MED ORDER — FERROUS SULFATE 325 (65 FE) MG PO TABS
325.0000 mg | ORAL_TABLET | Freq: Every day | ORAL | Status: DC
  Administered 2023-05-18 – 2023-05-19 (×2): 325 mg via ORAL
  Filled 2023-05-18 (×2): qty 1

## 2023-05-18 NOTE — Progress Notes (Addendum)
Patient ID: ALVAH GILDER, female   DOB: Feb 27, 1991, 32 y.o.   MRN: [id number] POSTPARTUM PROGRESS NOTE  Post Partum Day 1  Subjective:  DEANETTE TULLIUS is a 32 y.o. [id number] s/p SVD via waterbirth at [redacted]w[redacted]d.  She reports she is doing well. No acute events overnight. She denies any problems with ambulating, voiding or po intake. Denies nausea or vomiting.  Pain is well controlled.  Lochia is appropriate.  Objective: Blood pressure 104/60, pulse 69, temperature 97.8 F (36.6 C), temperature source Oral, resp. rate 17, height 5\' 2"  (1.575 m), weight 84.8 kg, last menstrual period 08/20/2022, SpO2 100%.  Physical Exam:  General: alert, cooperative and no distress Chest: no respiratory distress Heart:regular rate, distal pulses intact Uterine Fundus: firm, appropriately tender DVT Evaluation: No calf swelling or tenderness Extremities: No LE edema Skin: warm, dry  Recent Labs    05/17/23 1252  HGB 10.9*  HCT 32.7*    Assessment/Plan: CHRYSTEL BAREFIELD is a 32 y.o. [phone] s/p SVD via waterbirth at [redacted]w[redacted]d   PPD#1 - Doing well  Routine postpartum care  Rh negative status - Rhogam today  Anemia: Start PO Fe daily  Contraception: Condoms Feeding: Breastfeeding  Dispo: Plan for discharge tomorrow.   LOS: 1 day   Brien Mates, Student-PA 05/18/2023, 8:39 AM   Attestation of Supervision of Student:  I confirm that I have verified the information documented in the physician assistant student's note and that I have also personally reperformed the history, physical exam and all medical decision making activities.  I have verified that all services and findings are accurately documented in this student's note; and I agree with management and plan as outlined in the documentation. I have also made any necessary editorial changes.   Sundra Aland, MD [facility] for [facility], [facility] 05/18/2023 10:16 AM

## 2023-05-19 LAB — RH IG WORKUP (INCLUDES ABO/RH)
Fetal Screen: NEGATIVE
Gestational Age(Wks): 38.4
Unit division: 0

## 2023-05-19 MED ORDER — FERROUS SULFATE 325 (65 FE) MG PO TABS: 325.0000 mg | ORAL_TABLET | Freq: Every day | ORAL | 0 refills | Status: DC

## 2023-05-19 MED ORDER — IBUPROFEN 600 MG PO TABS: 600.0000 mg | ORAL_TABLET | Freq: Four times a day (QID) | ORAL | 0 refills | Status: DC

## 2023-05-19 MED ORDER — OXYCODONE HCL 5 MG PO TABS: 5.0000 mg | ORAL_TABLET | ORAL | 0 refills | Status: DC | PRN

## 2023-05-19 MED ORDER — SENNA 8.6 MG PO TABS: 2.0000 | ORAL_TABLET | Freq: Every day | ORAL | 0 refills | Status: DC | PRN

## 2023-05-19 MED ORDER — SENNA 8.6 MG PO TABS: 2.0000 | ORAL_TABLET | Freq: Every day | ORAL | Status: DC | PRN

## 2023-05-23 ENCOUNTER — Encounter: Payer: Medicaid Other | Admitting: Advanced Practice Midwife

## 2023-06-11 ENCOUNTER — Telehealth (HOSPITAL_COMMUNITY): Payer: Self-pay

## 2023-06-11 NOTE — Telephone Encounter (Signed)
06/11/2023 1128  Name: VIENNE CORCORAN MRN: [id number] DOB: 01/18/1991  Reason for Call:  Transition of Care Hospital Discharge Call  Contact Status: Patient Contact Status: Message  Language assistant needed: Interpreter Mode: Interpreter Not Needed        Follow-Up Questions:    Inocente Salles Postnatal Depression Scale:  In the Past 7 Days:    PHQ2-9 Depression Scale:     Discharge Follow-up:    Post-discharge interventions: NA  Signature  Signe Colt

## 2023-06-30 ENCOUNTER — Encounter (HOSPITAL_COMMUNITY): Payer: Self-pay | Admitting: Family Medicine

## 2023-07-05 ENCOUNTER — Ambulatory Visit: Payer: Medicaid Other | Admitting: Family Medicine

## 2023-08-23 ENCOUNTER — Telehealth: Payer: Self-pay

## 2023-08-23 NOTE — Telephone Encounter (Signed)
 TC to pt. Pt requesting referral to therapy. Amb referral to [facility] placed.

## 2023-09-06 ENCOUNTER — Ambulatory Visit: Payer: Medicaid Other | Admitting: Licensed Clinical Social Worker

## 2023-09-06 NOTE — BH Specialist Note (Signed)
Patient no showed to visit. A virtual link and call was provided. A VM was left.

## 2023-09-20 ENCOUNTER — Ambulatory Visit: Payer: Medicaid Other | Admitting: Family Medicine

## 2024-01-02 ENCOUNTER — Encounter (HOSPITAL_COMMUNITY): Payer: Self-pay

## 2024-01-02 ENCOUNTER — Ambulatory Visit (INDEPENDENT_AMBULATORY_CARE_PROVIDER_SITE_OTHER)

## 2024-01-02 ENCOUNTER — Ambulatory Visit (HOSPITAL_COMMUNITY)
Admission: EM | Admit: 2024-01-02 | Discharge: 2024-01-02 | Disposition: A | Discharge status: Home / Self Care | Attending: Physician Assistant | Admitting: Physician Assistant

## 2024-01-02 DIAGNOSIS — G5601 Carpal tunnel syndrome, right upper limb: Secondary | ICD-10-CM

## 2024-01-02 DIAGNOSIS — M25531 Pain in right wrist: Secondary | ICD-10-CM | POA: Diagnosis not present

## 2024-01-02 DIAGNOSIS — R011 Cardiac murmur, unspecified: Secondary | ICD-10-CM

## 2024-01-02 MED ORDER — PREDNISONE 10 MG (21) PO TBPK: ORAL_TABLET | ORAL | 0 refills | Status: DC

## 2024-01-02 NOTE — ED Provider Notes (Signed)
 [facility]    CSN: [id number] Arrival date & time: 01/02/24  1239      History   Chief Complaint Chief Complaint  Patient presents with   Arm Pain    HPI Jade Brown is a 33 y.o. female.   Patient presents today with a month-long history of right hand/wrist pain that is now radiating into her arm.  She denies any known injury, fall, trauma before her symptoms began.  They have been worsening and she is now having difficulty sleeping as a result of the pain.  She reports being told that she had carpal tunnel in the past but is never had any procedure to address this; review of her chart indicates previous diagnosis of de Quervain's tenosynovitis but no mention of carpal tunnel.  She does report some numbness and burning in her thumb and index finger on the right hand but denies any involvement of her middle, ring, pinky finger.  She has been taking Tylenol  and ibuprofen  as well as applying ice without improvement of symptoms.  She is right-handed and having difficulty with her daily activities as gripping has become problematic.  She has never seen a specialist.    Past Medical History:  Diagnosis Date   Bacterial vaginosis 06/11/2019   De Quervain's tenosynovitis, right 02/18/2020   Gallstones    Medical history non-contributory    Normal labor 08/08/2020   Wrist pain, chronic, right 10/31/2020    Patient Active Problem List   Diagnosis Date Noted   Uterine contractions 05/17/2023   SVD (spontaneous vaginal delivery) 05/17/2023   Obesity affecting pregnancy 02/18/2023   Supervision of other normal pregnancy, antepartum 10/26/2022   Rh negative status during pregnancy 12/01/2018    Past Surgical History:  Procedure Laterality Date   CHOLECYSTECTOMY N/A 12/04/2013   Procedure: LAPAROSCOPIC CHOLECYSTECTOMY;  Surgeon: Shela Derby, MD;  Location: [facility] OR;  Service: General;  Laterality: N/A;   DILATION AND CURETTAGE OF UTERUS     WISDOM TOOTH EXTRACTION       OB History     Gravida  7   Para  4   Term  4   Preterm      AB  3   Living  4      SAB      IAB  3   Ectopic      Multiple  0   Live Births  4            Home Medications    Prior to Admission medications   Medication Sig Start Date End Date Taking? Authorizing Provider  predniSONE  (STERAPRED UNI-PAK 21 TAB) 10 MG (21) TBPK tablet As directed 01/02/24  Yes Essex Perry K, PA-C  Blood Pressure Monitoring (BLOOD PRESSURE KIT) DEVI 1 Device by Does not apply route as needed. 02/05/20   Abigail Abler, MD  Blood Pressure Monitoring (BLOOD PRESSURE KIT) DEVI 1 kit by Does not apply route once a week. 10/26/22   Constant, Carles Cheadle, MD  Misc. Devices (GOJJI WEIGHT SCALE) MISC 1 Device by Does not apply route every 30 (thirty) days. 10/26/22   Constant, Peggy, MD    Family History Family History  Problem Relation Age of Onset   Hypertension Mother    Diabetes Mother    Diabetes Maternal Grandmother     Social History Social History   Tobacco Use   Smoking status: Former    Types: Cigars    Quit date: 01/12/2011    Years since quitting:  12.9    Passive exposure: Past   Smokeless tobacco: Never  Vaping Use   Vaping status: Former  Substance Use Topics   Alcohol use: No   Drug use: Not Currently    Types: Marijuana    Comment: not since confirmed pregnancy     Allergies   Flagyl  [metronidazole ]   Review of Systems Review of Systems  Constitutional:  Positive for activity change. Negative for appetite change, fatigue and fever.  Respiratory:  Negative for shortness of breath.   Cardiovascular:  Negative for chest pain.  Musculoskeletal:  Positive for arthralgias. Negative for myalgias.  Skin:  Negative for color change and wound.  Neurological:  Positive for weakness (right hand) and numbness (right thumb and index finger).     Physical Exam Triage Vital Signs ED Triage Vitals  Encounter Vitals Group     BP 01/02/24 1413 92/63     Systolic  BP Percentile --      Diastolic BP Percentile --      Pulse Rate 01/02/24 1413 (!) 50     Resp 01/02/24 1413 16     Temp 01/02/24 1413 98.4 F (36.9 C)     Temp Source 01/02/24 1413 Oral     SpO2 01/02/24 1413 98 %     Weight --      Height --      Head Circumference --      Peak Flow --      Pain Score 01/02/24 1409 10     Pain Loc --      Pain Education --      Exclude from Growth Chart --    No data found.  Updated Vital Signs BP 92/63 (BP Location: Right Arm)   Pulse (!) 50   Temp 98.4 F (36.9 C) (Oral)   Resp 16   LMP 12/08/2023 (Approximate)   SpO2 98%   Breastfeeding No   Visual Acuity Right Eye Distance:   Left Eye Distance:   Bilateral Distance:    Right Eye Near:   Left Eye Near:    Bilateral Near:     Physical Exam Vitals reviewed.  Constitutional:      General: She is awake. She is not in acute distress.    Appearance: Normal appearance. She is well-developed. She is not ill-appearing.     Comments: Very pleasant female appears stated age in no acute distress sitting comfortably in exam room  HENT:     Head: Normocephalic and atraumatic.     Mouth/Throat:     Pharynx: Uvula midline. No oropharyngeal exudate or posterior oropharyngeal erythema.  Cardiovascular:     Rate and Rhythm: Normal rate and regular rhythm.     Heart sounds: S1 normal and S2 normal. Murmur heard.     Comments: Capillary fill within 2 seconds right fingers Pulmonary:     Effort: Pulmonary effort is normal.     Breath sounds: Normal breath sounds. No wheezing, rhonchi or rales.     Comments: Clear to auscultation bilaterally Musculoskeletal:     Right wrist: Tenderness present. No bony tenderness or snuff box tenderness. Normal range of motion.     Right hand: No bony tenderness. Normal range of motion. Normal strength. There is no disruption of two-point discrimination. Normal capillary refill.     Comments: Right hand/wrist: Tenderness palpation over distal right radius  without deformity.  No snuffbox tenderness.  Normal 2-point discrimination.  Decreased grip strength secondary to pain on the right.  Positive  Tinel and Phalen's.  Negative Finkelstein.  Psychiatric:        Behavior: Behavior is cooperative.      UC Treatments / Results  Labs (all labs ordered are listed, but only abnormal results are displayed) Labs Reviewed - No data to display  EKG   Radiology DG Wrist Complete Right Result Date: 01/02/2024 CLINICAL DATA:  Right wrist pain, history of carpal tunnel syndrome EXAM: RIGHT WRIST - COMPLETE 3+ VIEW COMPARISON:  12/19/2009 FINDINGS: Frontal, oblique, lateral, and ulnar deviated views of the right wrist are obtained. No acute fracture, subluxation, or dislocation. Joint spaces are well preserved. Soft tissues are unremarkable. IMPRESSION: 1. Unremarkable right wrist. Electronically Signed   By: Bobbye Burrow M.D.   On: 01/02/2024 14:52    Procedures Procedures (including critical care time)  Medications Ordered in UC Medications - No data to display  Initial Impression / Assessment and Plan / UC Course  I have reviewed the triage vital signs and the nursing notes.  Pertinent labs & imaging results that were available during my care of the patient were reviewed by me and considered in my medical decision making (see chart for details).     Patient is well-appearing, afebrile, nontoxic, nontachycardic.  Hand is neurovascularly intact.  X-ray was obtained given bony tenderness over radius that showed no acute osseous abnormality.  Given her clinical presentation I am concerned for carpal tunnel syndrome.  She was placed on a steroid taper with instruction not to take NSAIDs with this medication due to risk of GI bleeding.  She was also given a brace with instruction to use this is much as possible to keep her wrist elevated particularly when she is resting at home.  She will need to follow-up with orthopedics and was given the contact  information for local provider with instruction to call to schedule an appointment.  We discussed that if anything worsens or changes she needs to be seen immediately.  Strict return precautions given.  Murmur with heard on exam patient denied any known history of a murmur.  I recommend she follow-up with cardiology to consider echocardiogram.  She was given the contact information for local provider with instruction to call to schedule parent.  She is not currently of a primary care and so was scheduled a primary care appointment before discharge with instruction to keep this appointment for ongoing medical needs.  Final Clinical Impressions(s) / UC Diagnoses   Final diagnoses:  Carpal tunnel syndrome on right  Right wrist pain  Murmur     Discharge Instructions      Your x-ray was normal.  I am concerned that you have carpal tunnel.  Take prednisone  as prescribed.  Do not take NSAIDs with this medication including aspirin, ibuprofen /Advil , naproxen /Aleve .  Use the brace as much as possible particularly at night.  Keep your arm elevated when you are sitting.  It is very important that you follow-up with orthopedics so please call to schedule an appointment.  If you have any worsening or changing symptoms including increasing pain, numbness or tingling, discoloration of your hand, cold sensation in your hand you need to be seen immediately.  As we discussed, I heard a murmur on your exam.  I would like you to follow-up with a cardiologist to have an ultrasound of your heart.  Call them to schedule an appointment.  We have also scheduled you an appointment with primary care so please follow-up with them as we discussed.   ED Prescriptions  Medication Sig Dispense Auth. Provider   predniSONE  (STERAPRED UNI-PAK 21 TAB) 10 MG (21) TBPK tablet As directed 21 tablet Frederick Klinger K, PA-C      PDMP not reviewed this encounter.   Budd Cargo, PA-C 01/02/24 1502

## 2024-01-02 NOTE — ED Triage Notes (Signed)
 Patient here today with c/o right wrist pain that radiates up arm to shoulder. Patient states that she has been told that she has Carpal Tunnel in the past. Patient states that she has difficulty with gripping and ROM.

## 2024-01-02 NOTE — Discharge Instructions (Signed)
 Your x-ray was normal.  I am concerned that you have carpal tunnel.  Take prednisone  as prescribed.  Do not take NSAIDs with this medication including aspirin, ibuprofen /Advil , naproxen /Aleve .  Use the brace as much as possible particularly at night.  Keep your arm elevated when you are sitting.  It is very important that you follow-up with orthopedics so please call to schedule an appointment.  If you have any worsening or changing symptoms including increasing pain, numbness or tingling, discoloration of your hand, cold sensation in your hand you need to be seen immediately.  As we discussed, I heard a murmur on your exam.  I would like you to follow-up with a cardiologist to have an ultrasound of your heart.  Call them to schedule an appointment.  We have also scheduled you an appointment with primary care so please follow-up with them as we discussed.

## 2024-01-05 ENCOUNTER — Ambulatory Visit (INDEPENDENT_AMBULATORY_CARE_PROVIDER_SITE_OTHER): Admitting: Internal Medicine

## 2024-01-05 ENCOUNTER — Encounter: Payer: Self-pay | Admitting: Internal Medicine

## 2024-01-05 VITALS — BP 96/62 | HR 83 | Temp 98.1°F | Ht 62.0 in | Wt 186.2 lb

## 2024-01-05 DIAGNOSIS — G5601 Carpal tunnel syndrome, right upper limb: Secondary | ICD-10-CM | POA: Diagnosis not present

## 2024-01-05 DIAGNOSIS — R011 Cardiac murmur, unspecified: Secondary | ICD-10-CM | POA: Diagnosis not present

## 2024-01-05 NOTE — Progress Notes (Signed)
 [facility] [address] [place] [address] Dept: [phone] Dept Fax: [phone]  New Patient Office Visit  Subjective:   Jade Brown 05/04/1991 01/05/2024  Chief Complaint  Patient presents with   Establish Care   Carpal Tunnel    Right arm    HPI: Jade Brown presents today to establish care at [facility] [employer]. Introduced to Publishing rights manager role and practice setting.  All questions answered.  Concerns: See below    Discussed the use of AI scribe software for clinical note transcription with the patient, who gave verbal consent to proceed.  History of Present Illness   Jade Brown is a 33 year old female with carpal tunnel syndrome who presents with right wrist pain and weakness. She is accompanied by her children.  She has been experiencing pain and weakness in her right wrist, initially noticed years ago, which has recently worsened. An x-ray was performed at urgent care a few days ago. The pain starts in the wrist and radiates up the arm, with associated numbness and tingling, particularly affecting the thumb and index finger. The pain and numbness worsen at night, making it difficult for her to sleep.  She has a history of similar symptoms and previously attended therapy and performed hand exercises, but did not undergo surgery. Her grip has weakened, causing her to drop objects, and she experiences increased pain with repetitive motions, such as doing her children's hair. She has been using Tylenol  and ibuprofen  for pain relief, which helps her sleep, but she has not started the prescribed steroid pack yet.  She recently had a baby in October and has several young children, which she believes may have contributed to her symptoms. She recalls experiencing similar symptoms during her pregnancy, which improved postpartum but remain bothersome.  No recent injury to the wrist or arm.  The pain occasionally extends to her elbow, causing an achy sensation, but she does not experience pain when stretching the arm.   She also mentions a heart murmur that was recently detected, although she is asymptomatic with no chest pain, palpitations, or shortness of breath.       The following portions of the patient's history were reviewed and updated as appropriate: past medical history, past surgical history, family history, social history, allergies, medications, and problem list.   Patient Active Problem List   Diagnosis Date Noted   Heart murmur 01/05/2024   Past Medical History:  Diagnosis Date   Bacterial vaginosis 06/11/2019   De Quervain's tenosynovitis, right 02/18/2020   Gallstones    Medical history non-contributory    Normal labor 08/08/2020   Rh negative status during pregnancy 12/01/2018   Wrist pain, chronic, right 10/31/2020   Past Surgical History:  Procedure Laterality Date   CHOLECYSTECTOMY N/A 12/04/2013   Procedure: LAPAROSCOPIC CHOLECYSTECTOMY;  Surgeon: Shela Derby, MD;  Location: [facility] OR;  Service: General;  Laterality: N/A;   DILATION AND CURETTAGE OF UTERUS     WISDOM TOOTH EXTRACTION     Family History  Problem Relation Age of Onset   Hypertension Mother    Diabetes Mother    Diabetes Maternal Grandmother     Current Outpatient Medications:    predniSONE  (STERAPRED UNI-PAK 21 TAB) 10 MG (21) TBPK tablet, As directed, Disp: 21 tablet, Rfl: 0 Allergies  Allergen Reactions   Flagyl  [Metronidazole ] Nausea And Vomiting    ROS: A complete ROS was performed with pertinent positives/negatives noted in the  HPI. The remainder of the ROS are negative.   Objective:   Today's Vitals   01/05/24 1436  BP: 96/62  Pulse: 83  Temp: 98.1 F (36.7 C)  TempSrc: Temporal  SpO2: 99%  Weight: 186 lb 3.2 oz (84.5 kg)  Height: 5\' 2"  (1.575 m)    GENERAL: Well-appearing, in NAD. Well nourished.  SKIN: Pink, warm and dry. No rash.  NECK: Trachea  midline. Full ROM w/o pain or tenderness. No lymphadenopathy.  RESPIRATORY: Chest wall symmetrical. Respirations even and non-labored. Breath sounds clear to auscultation bilaterally.  CARDIAC: S1, S2 present, regular rate and rhythm. Peripheral pulses 2+ bilaterally. Faint 2/6 systolic murmur at left 2nd intercostal boarder  MSK: Right wrist: (+) tinel's and phalen's sign. Neg Finkelstein test.  No snuffbox tenderness.  EXTREMITIES: Without clubbing, cyanosis, or edema.  NEUROLOGIC: Steady, even gait.  PSYCH/MENTAL STATUS: Alert, oriented x 3. Cooperative, appropriate mood and affect.   There are no preventive care reminders to display for this patient.   No results found for any visits on 01/05/24.  Assessment & Plan:  Assessment and Plan    Carpal tunnel syndrome - Chronic condition, recently worsened.  - Continue current steroid pack. - Use wrist brace during the day and consider at night if needed. - Post-steroid pack, use acetaminophen  or ibuprofen  for pain as needed. - Refer to orthopedics for evaluation and management, including potential physical therapy, steroid injections, or surgery.  Heart murmur Newly identified small heart murmur, likely asymptomatic. Explained as abnormal blood flow, common and often benign, but requires evaluation to rule out underlying issues. - Order echocardiogram to evaluate heart function and blood flow.   Orders Placed This Encounter  Procedures   Ambulatory referral to Orthopedics    Referral Priority:   Routine    Referral Type:   Consultation    Number of Visits Requested:   1   ECHOCARDIOGRAM COMPLETE    Standing Status:   Future    Expiration Date:   01/04/2025    [facility] performed[facility]    Does the patient weigh less than or greater than 250 lbs?:   Patient weighs less than 250 lbs    Perflutren DEFINITY (image enhancing agent) should be administered unless hypersensitivity or allergy exist:    Administer Perflutren    Reason for exam-Echo:   Murmur R01.1   No orders of the defined types were placed in this encounter.   Return in about 3 months (around 04/06/2024) for Annual Physical Exam with fasting lab work.   Gavin Kast, FNP

## 2024-01-05 NOTE — Patient Instructions (Signed)
 Continue Steroid Pack as prescribed by urgent care  Use wrist brace  Ice packs as needed  Can use ibuprofen  as needed for pain   Referral coordinator will call you for scheduling with orthopedics

## 2024-02-09 ENCOUNTER — Encounter: Payer: Self-pay | Admitting: Internal Medicine

## 2024-02-09 ENCOUNTER — Telehealth: Admitting: Internal Medicine

## 2024-02-09 DIAGNOSIS — F320 Major depressive disorder, single episode, mild: Secondary | ICD-10-CM | POA: Diagnosis not present

## 2024-02-09 MED ORDER — SERTRALINE HCL 50 MG PO TABS
ORAL_TABLET | ORAL | 0 refills | Status: DC
Start: 2024-02-09 — End: 2024-05-22

## 2024-02-09 NOTE — Patient Instructions (Signed)
[facility]SEL[facility]CIES in [place], [place] [address] Urgent Care Services (ages 32 yo and up, available 24/7) Outpatient Counseling & Psychiatry (accepts people with no insurance, available during business hours)  [place] Medicaid  (* = Spanish available;  + = Psychiatric services) * [facility]  [facility] in [place]:                                     [phone] or [phone] Virtual & Onsite  [facility]:                                              [place]:                                         [phone] Virtual & Onsite  * [facility]:                                                   [phone]   [facility]                                                    [phone] Virtual & Onsite  [facility]           [phone] Virtual   [facility]:                                                           [facility]:                                      [place]:                            [facility]                                                [place] [facility] [place]:             [phone] or [phone]   *  [facility]                                                 [phone] Virtual & Onsite    Website to Find a Therapist:       [email or web address]   Substance Use [place]:                                [phone]  Alcoholics Anonymous:      [phone]  Narcotics Anonymous:       [phone]  Quit Smoking Hotline:         800-QUIT-NOW (800-784-8669)    

## 2024-02-09 NOTE — Progress Notes (Signed)
 [facility] [address] [place] [address] Dept: [phone] Dept Fax: [phone]  Virtual Video Visit  I connected with Jade Brown on 02/09/24 at  1:20 PM EDT by a video enabled telemedicine application and verified that I am speaking with the correct person using two identifiers.   Location patient: Home Location provider: Clinic Total time: 9 minutes Persons participating in the virtual visit: Patient; Jade Brown CMA; Rosina Senters, FNP-C  I discussed the limitations of evaluation and management by telemedicine and the availability of in-person appointments. The patient expressed understanding and agreed to proceed.  Chief Complaint  Patient presents with   Referral    Discuss depression and therapist     SUBJECTIVE:  HPI:  DEPRESSION: Jade Brown presents for the medical management of depression. Reports increase depression due to life stressors. Recently had daughter taken away by CPS. Also believes still struggling with post-partum depression, her youngest baby is 30 month old.  Current medication regimen: none Counseling: not currently Well controlled: no Denies SI/HI.   Patient also recently found out she is pregnant. She does not plan to keep pregnancy and has appt scheduled for abortion.      02/09/2024    1:31 PM 01/05/2024    3:02 PM 03/09/2023   10:16 AM  PHQ9 SCORE ONLY  PHQ-9 Total Score 7 14 4         02/09/2024    1:31 PM 01/05/2024    3:02 PM 03/09/2023   10:16 AM  Depression screen PHQ 2/9  Decreased Interest 1 2 0  Down, Depressed, Hopeless 1 3 1   PHQ - 2 Score 2 5 1   Altered sleeping 0 1 1  Tired, decreased energy 1 3 1   Change in appetite 1 2 1   Feeling bad or failure about yourself  0 1 0  Trouble concentrating 0 1 0  Moving slowly or fidgety/restless 3 1 0  Suicidal thoughts 0 0 0  PHQ-9 Score 7 14 4   Difficult doing work/chores Not difficult at all Somewhat difficult Not  difficult at all   The following portions of the patient's history were reviewed and updated as appropriate: medical history, surgical history, medications, allergies, social history, and family history.    Past Medical History:  Diagnosis Date   Bacterial vaginosis 06/11/2019   De Quervain's tenosynovitis, right 02/18/2020   Gallstones    Medical history non-contributory    Normal labor 08/08/2020   Rh negative status during pregnancy 12/01/2018   Wrist pain, chronic, right 10/31/2020   Past Surgical History:  Procedure Laterality Date   CHOLECYSTECTOMY N/A 12/04/2013   Procedure: LAPAROSCOPIC CHOLECYSTECTOMY;  Surgeon: Lynda Leos, MD;  Location: [facility];  Service: General;  Laterality: N/A;   DILATION AND CURETTAGE OF UTERUS     WISDOM TOOTH EXTRACTION       Current Outpatient Medications:    sertraline (ZOLOFT) 50 MG tablet, Take 1/2 tablet by mouth once daily for 5 days. Then take 1 tablet by mouth daily., Disp: 90 tablet, Rfl: 0 Allergies  Allergen Reactions   Flagyl  [Metronidazole ] Nausea And Vomiting    Social History   Socioeconomic History   Marital status: Single    Spouse name: Not on file   Number of children: Not on file   Years of education: Not on file   Highest education level: Not on file  Occupational History   Not on file  Tobacco Use   Smoking status: Former  Types: Cigars    Quit date: 01/12/2011    Years since quitting: 13.0    Passive exposure: Past   Smokeless tobacco: Never  Vaping Use   Vaping status: Former  Substance and Sexual Activity   Alcohol use: No   Drug use: Not Currently    Types: Marijuana    Comment: not since confirmed pregnancy   Sexual activity: Yes    Partners: Male    Birth control/protection: None  Other Topics Concern   Not on file  Social History Narrative   Not on file   Social Drivers of Health   Financial Resource Strain: Not on file  Food Insecurity: No Food Insecurity (05/17/2023)   Hunger Vital  Sign    Worried About Running Out of Food in the Last Year: Never true    Ran Out of Food in the Last Year: Never true  Transportation Needs: No Transportation Needs (05/17/2023)   PRAPARE - Administrator, Civil Service (Medical): No    Lack of Transportation (Non-Medical): No  Physical Activity: Not on file  Stress: Not on file  Social Connections: Not on file  Intimate Partner Violence: Not At Risk (05/17/2023)   Humiliation, Afraid, Rape, and Kick questionnaire    Fear of Current or Ex-Partner: No    Emotionally Abused: No    Physically Abused: No    Sexually Abused: No    Family History  Problem Relation Age of Onset   Hypertension Mother    Diabetes Mother    Diabetes Maternal Grandmother      ROS: A complete ROS was performed with pertinent positives/negatives noted in the HPI. The remainder of the ROS are negative.    OBJECTIVE:  VITALS per patient if applicable: There were no vitals filed for this visit. There is no height or weight on file to calculate BMI.   GENERAL: Alert and oriented. Appears well and in no acute distress. HEENT: Atraumatic. Conjunctiva clear. No obvious abnormalities on inspection of external nose and ears. NECK: Normal movements of the head and neck. LUNGS: On inspection, no signs of respiratory distress. Breathing rate appears normal. No obvious gross SOB, gasping or wheezing, and no conversational dyspnea. CV: No obvious cyanosis. MS: Moves all visible extremities without noticeable abnormality. PSYCH/NEURO: Pleasant and cooperative. No obvious depression or anxiety. Speech and thought processing grossly intact.  ASSESSMENT AND PLAN: 1. Depression, major, single episode, mild (HCC) (Primary) - sertraline (ZOLOFT) 50 MG tablet; Take 1/2 tablet by mouth once daily for 5 days. Then take 1 tablet by mouth daily.  Dispense: 90 tablet; Refill: 0 - counseling services provided on AVS    I discussed the assessment and treatment plan  with the patient. The patient was provided an opportunity to ask questions and all were answered. The patient agreed with the plan and demonstrated an understanding of the instructions.   The patient was advised to call back or seek an in-person evaluation if the symptoms worsen or if the condition fails to improve as anticipated.  Return in about 6 weeks (around 03/22/2024) for depression.  Rosina Senters, FNP

## 2024-02-24 ENCOUNTER — Telehealth: Payer: Self-pay | Admitting: Internal Medicine

## 2024-02-24 NOTE — Telephone Encounter (Signed)
 Copied from CRM [id number]. Topic: Referral - Request for Referral >> Feb 24, 2024  9:44 AM Deleta RAMAN wrote: Did the patient discuss referral with their provider in the last year? Yes (If No - schedule appointment) (If Yes - send message)  Appointment offered? No  Type of order/referral and detailed reason for visit: Therapist   Preference of office, provider, location: Patient wants a female that is near [place] and accepts medicaid   If referral order, have you been seen by this specialty before? No (If Yes, this issue or another issue? When? Where?  Can we respond through MyChart? Yes, patient would like a call back once everything is completed. A good call back number is [phone]

## 2024-02-24 NOTE — Telephone Encounter (Signed)
**Note De-identified  [place] Obfuscation** Please advise 

## 2024-02-28 ENCOUNTER — Ambulatory Visit (HOSPITAL_COMMUNITY)

## 2024-02-28 NOTE — Telephone Encounter (Signed)
 Below is a list of counseling resources in the [place] area that accepts Medicaid. She has to call them to schedule appointments.    COUNSELING AGENCIES in [facility] [phone] [address] [place], [place] [address] Urgent Care Services (ages 32 yo and up, available 24/7) Outpatient Counseling & Psychiatry (accepts people with no insurance, available during business hours)  Mental Health- Accepts Medicaid  (* = Spanish available;  + = Psychiatric services) * [facility]                            [phone]  Walk in 9am-1pm Virtual & Onsite  *+ [facility]:                                     [phone] or [phone] Virtual & Onsite  [facility]:                                              [phone] Virtual & Onsite  + [facility]:                                         [phone] Virtual & Onsite  * [facility]:                                                   [phone]   [facility]                                                    [phone] Virtual & Onsite  [facility]           [phone] Virtual   [facility]:                                                           [phone] Virtual & Onsite  [facility]:                                      [phone] Virtual & Onsite  [facility]:                            [phone]   *[facility]                                                [phone] Virtual &  Onsite  + [facility]:             [phone] or [phone]   [facility]                                                 [phone] Virtual & Onsite    Website to Find a Therapist:       [email or web address] [email or web address]   Substance Use [place]:                                [phone]  Alcoholics Anonymous:       [phone]  Narcotics Anonymous:       [phone]  Quit Smoking Hotline:         800-QUIT-NOW [phone]) [phone])

## 2024-04-02 ENCOUNTER — Ambulatory Visit (HOSPITAL_COMMUNITY)

## 2024-04-04 ENCOUNTER — Ambulatory Visit: Admitting: Obstetrics and Gynecology

## 2024-04-05 NOTE — Progress Notes (Deleted)
 Subjective:   Jade Brown September 14, 1990  04/06/2024   CC: No chief complaint on file.   HPI: Jade Brown is a 33 y.o. female who presents for a routine health maintenance exam.  Labs *** collected at time of visit.    HEALTH SCREENINGS: - Pap smear: up to date - Mammogram (40+): Not applicable  - Colonoscopy (45+): Not applicable  - Bone Density (65+): Not applicable  - Lung CA screening with low-dose CT:  Not applicable Adults age 76-80 who are current cigarette smokers or quit within the last 15 years. Must have 20 pack year history.   IMMUNIZATIONS: - Tdap: Tetanus vaccination status reviewed: last tetanus booster within 10 years. - HPV: Up to date - Influenza: Postponed to flu season - Prevnar 20: Not applicable - Zostavax (50+): Not applicable   Past medical history, surgical history, medications, allergies, family history and social history reviewed with patient today and changes made to appropriate areas of the chart.   Social History   Socioeconomic History   Marital status: Single    Spouse name: Not on file   Number of children: Not on file   Years of education: Not on file   Highest education level: Not on file  Occupational History   Not on file  Tobacco Use   Smoking status: Former    Types: Cigars    Quit date: 01/12/2011    Years since quitting: 13.2    Passive exposure: Past   Smokeless tobacco: Never  Vaping Use   Vaping status: Former  Substance and Sexual Activity   Alcohol use: No   Drug use: Not Currently    Types: Marijuana    Comment: not since confirmed pregnancy   Sexual activity: Yes    Partners: Male    Birth control/protection: None  Other Topics Concern   Not on file  Social History Narrative   Not on file   Social Drivers of Health   Financial Resource Strain: Not on file  Food Insecurity: No Food Insecurity (05/17/2023)   Hunger Vital Sign    Worried About Running Out of Food in the Last Year: Never true    Ran Out  of Food in the Last Year: Never true  Transportation Needs: No Transportation Needs (05/17/2023)   PRAPARE - Administrator, Civil Service (Medical): No    Lack of Transportation (Non-Medical): No  Physical Activity: Not on file  Stress: Not on file  Social Connections: Not on file  Intimate Partner Violence: Not At Risk (05/17/2023)   Humiliation, Afraid, Rape, and Kick questionnaire    Fear of Current or Ex-Partner: No    Emotionally Abused: No    Physically Abused: No    Sexually Abused: No     Past Medical History:  Diagnosis Date   Bacterial vaginosis 06/11/2019   De Quervain's tenosynovitis, right 02/18/2020   Gallstones    Medical history non-contributory    Normal labor 08/08/2020   Rh negative status during pregnancy 12/01/2018   Wrist pain, chronic, right 10/31/2020    Past Surgical History:  Procedure Laterality Date   CHOLECYSTECTOMY N/A 12/04/2013   Procedure: LAPAROSCOPIC CHOLECYSTECTOMY;  Surgeon: Lynda Leos, MD;  Location: [facility] OR;  Service: General;  Laterality: N/A;   DILATION AND CURETTAGE OF UTERUS     WISDOM TOOTH EXTRACTION      Current Outpatient Medications on File Prior to Visit  Medication Sig   sertraline  (ZOLOFT ) 50 MG tablet Take 1/2 tablet  by mouth once daily for 5 days. Then take 1 tablet by mouth daily.   No current facility-administered medications on file prior to visit.    Allergies  Allergen Reactions   Flagyl  [Metronidazole ] Nausea And Vomiting    Family History  Problem Relation Age of Onset   Hypertension Mother    Diabetes Mother    Diabetes Maternal Grandmother      ROS: Denies fever, fatigue, unexplained weight loss/gain, hearing or vision changes, cardiac or respiratory complaints. Denies neurological deficits, musculoskeletal complaints, gastrointestinal or genitourinary complaints, mental health complaints, and skin changes.   Objective:   There were no vitals filed for this visit.  GENERAL  APPEARANCE: Well-appearing, in NAD. Well nourished.  SKIN: Pink, warm and dry. Turgor normal. No rash, lesion, ulceration, or ecchymoses. Hair evenly distributed.  HEENT: HEAD: Normocephalic.  EYES: PERRLA. EOMI. Lids intact w/o defect. Sclera white, Conjunctiva pink w/o exudate.  EARS: External ear w/o redness, swelling, masses or lesions. EAC clear. TM's intact, translucent w/o bulging, appropriate landmarks visualized. Appropriate acuity to conversational tones.  NOSE: Septum midline w/o deformity. Nares patent, mucosa pink and non-inflamed w/o drainage. No sinus tenderness.  THROAT: Uvula midline. Oropharynx clear. Tonsils non-inflamed w/o exudate ***. Oral mucosa pink and moist.  NECK: Supple, Trachea midline. Full ROM w/o pain or tenderness. No lymphadenopathy. Thyroid non-tender w/o enlargement or palpable masses.  BREASTS: Breasts pendulous, symmetrical, and w/o palpable masses. Nipples everted and w/o discharge. No rash or skin retraction. No axillary or supraclavicular lymphadenopathy.  RESPIRATORY: Chest wall symmetrical w/o masses. Respirations even and non-labored. Breath sounds clear to auscultation bilaterally. No wheezes, rales, rhonchi, or crackles. CARDIAC: S1, S2 present, regular rate and rhythm. No gallops, murmurs, rubs, or clicks. No carotid bruits. Capillary refill <2 seconds. Peripheral pulses 2+ bilaterally. GI: Abdomen soft w/o distention. Normoactive bowel sounds. No palpable masses or tenderness. No guarding or rebound tenderness. Liver and spleen w/o tenderness or enlargement. No CVA tenderness.  GU: *** External genitalia without erythema, lesions, or masses. No lymphadenopathy. Vaginal mucosa pink and moist without exudate, lesions, or ulcerations. Cervix pink without discharge. Cervical os closed. Uterus and adnexae palpable, not enlarged, and w/o tenderness. No palpable masses.  MSK: Muscle tone and strength appropriate for age, w/o atrophy or abnormal movement.   EXTREMITIES: Active ROM intact, w/o tenderness, crepitus, or contracture. No obvious joint deformities or effusions. No clubbing, edema, or cyanosis.  NEUROLOGIC: CN's II-XII intact. Motor strength symmetrical with no obvious weakness. No sensory deficits. DTR's 2+ symmetric bilaterally. Steady, even gait.  PSYCH/MENTAL STATUS: Alert, oriented x 3. Cooperative, appropriate mood and affect.   Chaperoned by Angelyn Hollingsworth CMA ***  Depression and Anxiety Screen done today and results listed below:     02/09/2024    1:31 PM 01/05/2024    3:02 PM 03/09/2023   10:16 AM 10/26/2022    1:54 PM 02/18/2022   11:17 AM  Depression screen PHQ 2/9  Decreased Interest 1 2 0 0 1  Down, Depressed, Hopeless 1 3 1  0 1  PHQ - 2 Score 2 5 1  0 2  Altered sleeping 0 1 1 0 0  Tired, decreased energy 1 3 1 1 2   Change in appetite 1 2 1 1 3   Feeling bad or failure about yourself  0 1 0 1 3  Trouble concentrating 0 1 0 0 3  Moving slowly or fidgety/restless 3 1 0 0 0  Suicidal thoughts 0 0 0 0 0  PHQ-9 Score 7 14 4  3 13  Difficult doing work/chores Not difficult at all Somewhat difficult Not difficult at all        02/09/2024    1:32 PM 01/05/2024    3:03 PM 03/09/2023   10:18 AM 10/26/2022    1:55 PM  GAD 7 : Generalized Anxiety Score  Nervous, Anxious, on Edge 0 1 0 0  Control/stop worrying 0 3 1 0  Worry too much - different things 1 3 1  0  Trouble relaxing 1 1 1  0  Restless 0 0 1 0  Easily annoyed or irritable 3 3 1 1   Afraid - awful might happen 0 0 0 0  Total GAD 7 Score 5 11 5 1   Anxiety Difficulty Not difficult at all Somewhat difficult Not difficult at all      Results for orders placed or performed during the hospital encounter of 05/17/23  Type and screen [facility]   Collection Time: 05/17/23 12:31 PM  Result Value Ref Range   ABO/RH(D) A NEG    Antibody Screen POS    Sample Expiration 05/20/2023,2359    Antibody Identification      PASSIVELY ACQUIRED ANTI-D Performed  at [facility], [address], [place], [place] [address]   CBC   Collection Time: 05/17/23 12:52 PM  Result Value Ref Range   WBC 14.7 (H) 4.0 - 10.5 K/uL   RBC 3.55 (L) 3.87 - 5.11 MIL/uL   Hemoglobin 10.9 (L) 12.0 - 15.0 g/dL   HCT 67.2 (L) 63.9 - 53.9 %   MCV 92.1 80.0 - 100.0 fL   MCH 30.7 26.0 - 34.0 pg   MCHC 33.3 30.0 - 36.0 g/dL   RDW 86.4 88.4 - 84.4 %   Platelets 284 150 - 400 K/uL   nRBC 0.0 0.0 - 0.2 %  RPR   Collection Time: 05/17/23 12:52 PM  Result Value Ref Range   RPR Ser Ql NON REACTIVE NON REACTIVE  Rh IG workup (includes ABO/Rh)   Collection Time: 05/18/23  5:05 AM  Result Value Ref Range   Gestational Age(Wks) 38.4    Fetal Screen NEG    Unit Number [id number]    Blood Component Type RHIG    Unit division 00    Status of Unit ISSUED,FINAL    Transfusion Status      OK TO TRANSFUSE Performed at [facility], [address] [place], [place] [address]     Assessment & Plan:  There are no diagnoses linked to this encounter.  No orders of the defined types were placed in this encounter.   PATIENT COUNSELING:  - Encouraged a healthy well-balanced diet. Patient may adjust caloric intake to maintain or achieve ideal body weight. May reduce intake of dietary saturated fat and total fat and have adequate dietary potassium and calcium  preferably from fresh fruits, vegetables, and low-fat dairy products.   - Advised to avoid cigarette smoking. - Discussed with the patient that most people either abstain from alcohol or drink within safe limits (<=14/week and <=4 drinks/occasion for males, <=7/weeks and <= 3 drinks/occasion for females) and that the risk for alcohol disorders and other health effects rises proportionally with the number of drinks per week and how often a drinker exceeds daily limits. - Discussed cessation/primary prevention of drug use and availability of treatment for abuse.  - Discussed sexually transmitted diseases,  avoidance of unintended pregnancy and contraceptive alternatives.  - Stressed the importance of regular exercise - Injury prevention:  Discussed safety belts, safety helmets, smoke detector, smoking near bedding or upholstery.  - Dental health: Discussed importance of regular tooth brushing, flossing, and dental visits.   NEXT PREVENTATIVE PHYSICAL DUE IN 1 YEAR.  No follow-ups on file.  Rosina Senters, FNP

## 2024-04-06 ENCOUNTER — Encounter: Payer: Self-pay | Admitting: Internal Medicine

## 2024-04-06 ENCOUNTER — Encounter: Admitting: Internal Medicine

## 2024-04-19 ENCOUNTER — Ambulatory Visit: Admitting: Family Medicine

## 2024-04-25 ENCOUNTER — Telehealth (HOSPITAL_COMMUNITY): Payer: Self-pay | Admitting: Internal Medicine

## 2024-04-25 ENCOUNTER — Ambulatory Visit (HOSPITAL_COMMUNITY): Attending: Internal Medicine

## 2024-04-25 NOTE — Telephone Encounter (Signed)
 04/25/24 NO SHOWED ECHO - CANCELLED ECHO x 2 7/22 and 8/25-We will not reach out to patient for rescheduling to to High NO SHOW RATE 36%.   Order will be removed from the echo WQ.Thank you

## 2024-05-17 ENCOUNTER — Other Ambulatory Visit: Payer: Self-pay | Admitting: Internal Medicine

## 2024-05-17 DIAGNOSIS — F320 Major depressive disorder, single episode, mild: Secondary | ICD-10-CM

## 2024-05-21 ENCOUNTER — Telehealth: Payer: Self-pay

## 2024-05-21 NOTE — Telephone Encounter (Signed)
 Please attempt to call patient to find out what she is asking about

## 2024-05-21 NOTE — Telephone Encounter (Signed)
 Copied from CRM [id number]. Topic: General - Other >> May 21, 2024  3:11 PM Rosina BIRCH wrote: Reason for CRM: patient would like to be called regarding her zofran  and what the social worker mentioned to her [phone]  ATC patient x1, [email or web address]

## 2024-05-22 NOTE — Telephone Encounter (Signed)
 Spoke with patient scheduled appointment for 10/20 at 10:40

## 2024-05-22 NOTE — Telephone Encounter (Signed)
 Pt requesting refill for sertraline  (ZOLOFT ) 50 MG tablet   LOV  01/05/24 FOV 05/28/24 LRF 02/09/24

## 2024-05-22 NOTE — Telephone Encounter (Signed)
 Copied from CRM [id number]. Topic: General - Other >> May 21, 2024  4:59 PM Nessti S wrote: Reason for CRM: patient called returning call from [employer].

## 2024-05-25 ENCOUNTER — Ambulatory Visit: Payer: Self-pay

## 2024-05-25 NOTE — Telephone Encounter (Signed)
 FYI Only or Action Required?: FYI only for provider.  Patient was last seen in primary care on 02/09/2024 by Billy Knee, FNP.  Called Nurse Triage reporting Insect Bite.  Symptoms began yesterday.  Interventions attempted: Nothing.  Symptoms are: gradually worsening.  Triage Disposition: See Physician Within 24 Hours  Patient/caregiver understands and will follow disposition?: Yes Reason for Disposition  [1] Red or very tender (to touch) area AND [2] started over 24 hours after the bite  Answer Assessment - Initial Assessment Questions Patient stated swelling, pain and redness have worsened since noticed it yesterday, advised UC for symptoms. No appointments available in office.  1. TYPE of INSECT: What type of insect was it?      Unsure  2. ONSET: When did you get bitten?      Yesterday  3. LOCATION: Where is the insect bite located?      Front part of left ear  4. REDNESS: Is the area red or pink? If Yes, ask: What size is the area of redness? (inches or cm). When did the redness start?     Yes  5. PAIN: Is there any pain? If Yes, ask: How bad is the pain? (Scale 0-10; or none, mild, moderate, severe)     Hurts really bad  6. ITCHING: Does it itch? If Yes, ask: How bad is the itch?      Denies  7. SWELLING: How big is the swelling? (e.g., inches, cm, or compare to coins)     Swollen, and going into behind of ear / face  8. OTHER SYMPTOMS: Do you have any other symptoms?  (e.g., difficulty breathing, fever, hives)     Denies  Protocols used: Insect Bite-A-AH  Copied from CRM [id number]. Topic: Clinical - Red Word Triage >> May 25, 2024  2:06 PM Alfonso ORN wrote: Red Word that prompted transfer to Nurse Triage: possible bite on ear. Swollen near ear hole and towards face

## 2024-05-28 ENCOUNTER — Ambulatory Visit: Admitting: Internal Medicine

## 2024-06-04 ENCOUNTER — Ambulatory Visit: Admitting: Internal Medicine

## 2024-06-05 ENCOUNTER — Ambulatory Visit: Admitting: Internal Medicine

## 2024-06-11 ENCOUNTER — Encounter: Payer: Self-pay | Admitting: Internal Medicine

## 2024-06-11 ENCOUNTER — Encounter: Payer: Self-pay | Admitting: Radiology

## 2024-06-12 ENCOUNTER — Encounter: Payer: Self-pay | Admitting: Internal Medicine

## 2024-06-12 ENCOUNTER — Ambulatory Visit (INDEPENDENT_AMBULATORY_CARE_PROVIDER_SITE_OTHER): Admitting: Internal Medicine

## 2024-06-12 VITALS — BP 96/62 | HR 65 | Temp 98.6°F | Ht 62.0 in | Wt 181.6 lb

## 2024-06-12 DIAGNOSIS — F320 Major depressive disorder, single episode, mild: Secondary | ICD-10-CM

## 2024-06-12 NOTE — Progress Notes (Signed)
 [facility] [address] [place] [address] Dept: [phone] Dept Fax: [phone]    Subjective:   Jade Brown 1991-07-19 06/12/2024  Chief Complaint  Patient presents with   Medical Management of Chronic Issues    Zoloft  follow up things are good with it    HPI: Jade Brown presents today for re-assessment and management of chronic medical conditions.  Discussed the use of AI scribe software for clinical note transcription with the patient, who gave verbal consent to proceed.  History of Present Illness   Jade Brown is a 33 year old female with depression who presents for follow-up.  She initially took Zoloft  50 mg once daily for a week but discontinued it due to adverse effects, feeling worse than before starting the medication. She chose not to continue with the medication.  Since stopping Zoloft , she has been motivated to achieve personal goals, including completing sales executive school. She reports managing life stressors better and a significant improvement personal life circumstances.   She has independently sought therapy and is currently attending sessions at [facility] in [place] with therapist Jade Brown as part of her strategy to manage her mental health without medication.  A social worker has been involved due to previous family issues, including the removal of one child from her home. The social worker has been checking in weekly, and the case is closing soon.   Patient is requesting a letter to provide to the social worker about her current mental health.   No suicidal or homicidal ideation.         06/12/2024    1:32 PM 02/09/2024    1:31 PM 01/05/2024    3:02 PM  Depression screen PHQ 2/9  Decreased Interest 0 1 2  Down, Depressed, Hopeless 0 1 3  PHQ - 2 Score 0 2 5  Altered sleeping 0 0 1  Tired, decreased energy 0 1 3  Change in appetite 0 1 2  Feeling bad or  failure about yourself  0 0 1  Trouble concentrating 0 0 1  Moving slowly or fidgety/restless 0 3 1  Suicidal thoughts 0 0 0  PHQ-9 Score 0 7 14  Difficult doing work/chores Not difficult at all Not difficult at all Somewhat difficult      06/12/2024    1:31 PM 02/09/2024    1:32 PM 01/05/2024    3:03 PM 03/09/2023   10:18 AM  GAD 7 : Generalized Anxiety Score  Nervous, Anxious, on Edge 0 0 1 0  Control/stop worrying 0 0 3 1  Worry too much - different things 0 1 3 1   Trouble relaxing 0 1 1 1   Restless 0 0 0 1  Easily annoyed or irritable 0 3 3 1   Afraid - awful might happen 0 0 0 0  Total GAD 7 Score 0 5 11 5   Anxiety Difficulty Not difficult at all Not difficult at all Somewhat difficult Not difficult at all     The following portions of the patient's history were reviewed and updated as appropriate: past medical history, past surgical history, family history, social history, allergies, medications, and problem list.   Patient Active Problem List   Diagnosis Date Noted   Heart murmur 01/05/2024   Past Medical History:  Diagnosis Date   Bacterial vaginosis 06/11/2019   De Quervain's tenosynovitis, right 02/18/2020   Gallstones    Medical history non-contributory    Normal labor 08/08/2020  Rh negative status during pregnancy 12/01/2018   Wrist pain, chronic, right 10/31/2020   Past Surgical History:  Procedure Laterality Date   CHOLECYSTECTOMY N/A 12/04/2013   Procedure: LAPAROSCOPIC CHOLECYSTECTOMY;  Surgeon: Lynda Leos, MD;  Location: [facility] OR;  Service: General;  Laterality: N/A;   DILATION AND CURETTAGE OF UTERUS     WISDOM TOOTH EXTRACTION     Family History  Problem Relation Age of Onset   Hypertension Mother    Diabetes Mother    Diabetes Maternal Grandmother    No current outpatient medications on file. Allergies  Allergen Reactions   Flagyl  [Metronidazole ] Nausea And Vomiting     ROS: A complete ROS was performed with pertinent positives/negatives  noted in the HPI. The remainder of the ROS are negative.    Objective:   Today's Vitals   06/12/24 1308  BP: 96/62  Pulse: 65  Temp: 98.6 F (37 C)  TempSrc: Temporal  SpO2: 99%  Weight: 181 lb 9.6 oz (82.4 kg)  Height: 5' 2 (1.575 m)    GENERAL: Well-appearing, in NAD. Well nourished.  SKIN: Pink, warm and dry. No rash, lesion, ulceration, or ecchymoses.  NECK: Trachea midline. Full ROM w/o pain or tenderness. No lymphadenopathy.  RESPIRATORY: Chest wall symmetrical. Respirations even and non-labored. Breath sounds clear to auscultation bilaterally.  CARDIAC: S1, S2 present, regular rate and rhythm. Peripheral pulses 2+ bilaterally.  EXTREMITIES: Without clubbing, cyanosis, or edema.  NEUROLOGIC: Steady, even gait.  PSYCH/MENTAL STATUS: Alert, oriented x 3. Cooperative, appropriate mood and affect.   There are no preventive care reminders to display for this patient.  No results found for any visits on 06/12/24.  The ASCVD Risk score (Arnett DK, et al., 2019) failed to calculate for the following reasons:   The 2019 ASCVD risk score is only valid for ages 64 to 59     Assessment & Plan:  Assessment and Plan    Depression Well-managed without medication. Improved mental health through therapy. No suicidal or homicidal ideation. - Continue routine counseling with Jade Brown at [facility]. - Provided note for social worker recommending continued routine counseling.  General Health Maintenance Routine health maintenance discussed. Due for annual physical exam with fasting blood work. - Scheduled annual physical exam in four months with fasting blood work.      No orders of the defined types were placed in this encounter.  No images are attached to the encounter or orders placed in the encounter. No orders of the defined types were placed in this encounter.   Return in about 4 months (around 10/10/2024) for Annual Physical Exam with fasting lab work.    Rosina Senters, FNP

## 2024-08-07 ENCOUNTER — Ambulatory Visit: Payer: Self-pay

## 2024-08-07 ENCOUNTER — Telehealth: Payer: Self-pay

## 2024-08-07 NOTE — Telephone Encounter (Signed)
 Called patient to offer an appointment, Patient wants to be seen at this location. No appointments were avail until Jan. Patient states she will go to ED or UC

## 2024-08-07 NOTE — Telephone Encounter (Signed)
 FYI Only or Action Required?: FYI only for provider: ED advised.  Patient was last seen in primary care on 06/12/2024 by Billy Knee, FNP.  Called Nurse Triage reporting Abdominal Pain.  Symptoms began yesterday.  Interventions attempted: Nothing.  Symptoms are: gradually worsening.  Triage Disposition: Go to ED Now (Notify PCP)  Patient/caregiver understands and will follow disposition?: Unsure Reason for Disposition  [1] SEVERE pain (e.g., excruciating) AND [2] present > 1 hour  Answer Assessment - Initial Assessment Questions Pt reports hx of cholecystectomy but is having pain that she says is similar from her lower back to the front of her lower abdomen.   1. LOCATION: Where does it hurt?      See above 2. RADIATION: Does the pain shoot anywhere else? (e.g., chest, back)     See above 3. ONSET: When did the pain begin? (e.g., minutes, hours or days ago)      08/06/24 4. SUDDEN: Gradual or sudden onset?     Sudden 5. PATTERN Does the pain come and go, or is it constant?     Constant 6. SEVERITY: How bad is the pain?  (e.g., Scale 1-10; mild, moderate, or severe)     10/10 7. RECURRENT SYMPTOM: Have you ever had this type of stomach pain before? If Yes, ask: When was the last time? and What happened that time?      When she had gallstones 8. CAUSE: What do you think is causing the stomach pain? (e.g., gallstones, recent abdominal surgery)     Unknown 9. RELIEVING/AGGRAVATING FACTORS: What makes it better or worse? (e.g., antacids, bending or twisting motion, bowel movement)     Denies 10. OTHER SYMPTOMS: Do you have any other symptoms? (e.g., back pain, diarrhea, fever, urination pain, vomiting)       Flank pain, abdominal pain worsens with urination 11. PREGNANCY: Is there any chance you are pregnant? When was your last menstrual period?       Denies chance of pregnancy  Protocols used: Abdominal Pain - [facility] Copied from CRM [id number].  Topic: Clinical - Red Word Triage >> Aug 07, 2024  8:08 AM Victoria A wrote: [facility] that prompted transfer to Nurse Triage: Patient is having pain in her abdomen and lower pain-pain is 10 Past Medical History:  Diagnosis Date   Bacterial vaginosis 06/11/2019   De Quervain's tenosynovitis, right 02/18/2020   Gallstones    Medical history non-contributory    Normal labor 08/08/2020   Rh negative status during pregnancy 12/01/2018   Wrist pain, chronic, right 10/31/2020  -Cholecystectomy

## 2024-08-16 ENCOUNTER — Ambulatory Visit: Payer: Self-pay

## 2024-08-16 NOTE — Telephone Encounter (Signed)
 FYI Only or Action Required?: FYI only for provider: ED advised.  Patient was last seen in primary care on 06/12/2024 by Billy Knee, FNP.  Called Nurse Triage reporting Abdominal Pain and Vaginal Bleeding.  Symptoms began about a month ago.  Interventions attempted: Rest, hydration, or home remedies.  Symptoms are: gradually worsening.  Triage Disposition: Go to ED Now (Notify PCP)  Patient/caregiver understands and will follow disposition?: No, refuses disposition  Reason for Disposition  SEVERE abdominal pain (e.g., excruciating)  Answer Assessment - Initial Assessment Questions Patient states that she has had continuous vaginal bleeding since the first week of December that varies in amount. She also complains of severe bilateral upper abdominal pain, chills/hot flashes, and lightheadedness. ED advised, refused. States she may go to UC today.   1. BLEEDING SEVERITY: Describe the bleeding that you are having. How much bleeding is there?      Moderate amount, sometimes more; Bleeding is like menstrual cycle but amount varies  2. ONSET: When did the bleeding begin? Is it continuing now?     Period started first week of December and has not stopped  3. MENSTRUAL PERIOD: When was the last normal menstrual period? How is this different than your period?     11/12-16  4. REGULARITY: How regular are your periods?     Usually regular  5. ABDOMEN PAIN: Do you have any pain? How bad is the pain?  (e.g., Scale 0-10; none, mild, moderate, or severe)     11/10  6. PREGNANCY: Is there any chance you are pregnant? When was your last menstrual period?     No, doesn't think so  7. BREASTFEEDING: Are you breastfeeding?     No  8. HORMONE MEDICINES: Are you taking any hormone medicines, prescription or over-the-counter? (e.g., birth control pills, estrogen)     No  9. BLOOD THINNER MEDICINES: Do you take any blood thinners? (e.g., Coumadin / warfarin,  Pradaxa / dabigatran, aspirin)     No  10. CAUSE: What do you think is causing the bleeding? (e.g., recent gyn surgery, recent gyn procedure; known bleeding disorder, cervical cancer, polycystic ovarian disease, fibroids)         Not sure  11. HEMODYNAMIC STATUS: Are you weak or feeling lightheaded? If Yes, ask: Can you stand and walk normally?        Yes, lightheaded  12. OTHER SYMPTOMS: What other symptoms are you having with the bleeding? (e.g., passed tissue, vaginal discharge, fever, menstrual-type cramps)       Abdominal pain  Protocols used: Vaginal Bleeding - Abnormal-A-AH  Copied from CRM #[id number]. Topic: Clinical - Red Word Triage >> Aug 16, 2024  1:05 PM Deleta RAMAN wrote: Red Word that prompted transfer to Nurse Triage: abdominal pain and on going bleeding

## 2024-08-17 NOTE — Telephone Encounter (Signed)
 I called and spoke with patient and she went to urgent care and had lab work and on the way to get an ultrasound done. I scheduled patient an appointment for Tuesday.

## 2024-08-21 ENCOUNTER — Inpatient Hospital Stay: Admitting: Internal Medicine

## 2024-08-21 NOTE — Progress Notes (Unsigned)
" °  [facility]ARE [facility] [address] [place] [address] Dept: [phone] Dept Fax: [phone]    Subjective:   Jade Brown 1990-12-04 08/21/2024  No chief complaint on file.   HPI:      The following portions of the patient's history were reviewed and updated as appropriate: past medical history, past surgical history, family history, social history, allergies, medications, and problem list.   Patient Active Problem List   Diagnosis Date Noted   Heart murmur 01/05/2024   Past Medical History:  Diagnosis Date   Bacterial vaginosis 06/11/2019   De Quervain's tenosynovitis, right 02/18/2020   Gallstones    Medical history non-contributory    Normal labor 08/08/2020   Rh negative status during pregnancy 12/01/2018   Wrist pain, chronic, right 10/31/2020   Past Surgical History:  Procedure Laterality Date   CHOLECYSTECTOMY N/A 12/04/2013   Procedure: LAPAROSCOPIC CHOLECYSTECTOMY;  Surgeon: Lynda Leos, MD;  Location: [facility] OR;  Service: General;  Laterality: N/A;   DILATION AND CURETTAGE OF UTERUS     WISDOM TOOTH EXTRACTION     Family History  Problem Relation Age of Onset   Hypertension Mother    Diabetes Mother    Diabetes Maternal Grandmother    Current Medications[1] Allergies[2]   ROS: A complete ROS was performed with pertinent positives/negatives noted in the HPI. The remainder of the ROS are negative.    Objective:   There were no vitals filed for this visit.  GENERAL: Well-appearing, in NAD. Well nourished.  SKIN: Pink, warm and dry. No rash, lesion, ulceration, or ecchymoses.  NECK: Trachea midline. Full ROM w/o pain or tenderness. No lymphadenopathy.  RESPIRATORY: Chest wall symmetrical. Respirations even and non-labored. Breath sounds clear to auscultation bilaterally.  CARDIAC: S1, S2 present, regular rate and rhythm. Peripheral pulses 2+ bilaterally.  MSK: Muscle tone and strength  appropriate for age. Joints w/o tenderness, redness, or swelling.  EXTREMITIES: Without clubbing, cyanosis, or edema.  NEUROLOGIC: No motor or sensory deficits. Steady, even gait.  PSYCH/MENTAL STATUS: Alert, oriented x 3. Cooperative, appropriate mood and affect.   Health Maintenance Due  Topic Date Due   COVID-19 Vaccine (1 - 2025-26 season) Never done    No results found for any visits on 08/21/24.  The ASCVD Risk score (Arnett DK, et al., 2019) failed to calculate for the following reasons:   The 2019 ASCVD risk score is only valid for ages 58 to 52   * - Cholesterol units were assumed     Assessment & Plan:    There are no diagnoses linked to this encounter. No orders of the defined types were placed in this encounter.  No images are attached to the encounter or orders placed in the encounter. No orders of the defined types were placed in this encounter.   No follow-ups on file.   Rosina Senters, FNP    [1] No current outpatient medications on file. [2]  Allergies Allergen Reactions   Flagyl  [Metronidazole ] Nausea And Vomiting   "

## 2024-08-27 ENCOUNTER — Ambulatory Visit: Admitting: Internal Medicine

## 2024-08-27 ENCOUNTER — Encounter: Payer: Self-pay | Admitting: Internal Medicine

## 2024-08-27 VITALS — BP 122/92 | HR 54 | Temp 97.8°F | Ht 62.0 in | Wt 178.0 lb

## 2024-08-27 DIAGNOSIS — F320 Major depressive disorder, single episode, mild: Secondary | ICD-10-CM

## 2024-08-27 DIAGNOSIS — N926 Irregular menstruation, unspecified: Secondary | ICD-10-CM | POA: Diagnosis not present

## 2024-08-27 NOTE — Progress Notes (Signed)
 " [facility]ARE [facility] [address] [place] [address] Dept: [phone] Dept Fax: [phone]    Subjective:   Jade Brown [birth year] 08/27/2024  Chief Complaint  Patient presents with   Follow-up    Urgent care visit, side pain (both) abnormal at the time     HPI:  Discussed the use of AI scribe software for clinical note transcription with the patient, who gave verbal consent to proceed.  History of Present Illness   Jade Brown is a 34 year old female who presents for a follow-up visit after experiencing lower abdominal pain and prolonged menstrual bleeding.  She was seen at urgent care on January 8th, 2026, for lower abdominal pain and menstrual bleeding that began in the first week of December. The bleeding has since stopped. A urine pregnancy test conducted at urgent care was negative. Blood work including CBC, CMP, and thyroid function tests were performed, revealing slight anemia with a hemoglobin of 11.3 and hematocrit of 34.1. Thyroid levels and electrolytes were normal. A pelvic ultrasound showed no acute findings.  She took Naprosyn  a couple of times, which helped with the pain but did not immediately stop the bleeding.   She attributes the prolonged bleeding to stress related to social services and family issues, including the return of her daughter from foster care on December 11th, which she describes as a stressful period. Once she resolved these issues and reconciled with her mother, the bleeding stopped.  She has been experiencing stress and anxiety due to family dynamics and the process of regaining custody of her daughter. Her mood has improved significantly since her daughter returned home. She continues to see a therapist for mental health support.  She recently graduated from administrator school and is living in an income-based apartment. She is seeking a note for an emotional support animal to comply  with her housing requirements. Depression remains stable and well controlled without medication. I do think she would benefit from ESA.          08/27/2024   11:01 AM 06/12/2024    1:32 PM 02/09/2024    1:31 PM  Depression screen PHQ 2/9  Decreased Interest 0 0 1  Down, Depressed, Hopeless 0 0 1  PHQ - 2 Score 0 0 2  Altered sleeping 0 0 0  Tired, decreased energy 0 0 1  Change in appetite 0 0 1  Feeling bad or failure about yourself  0 0 0  Trouble concentrating 0 0 0  Moving slowly or fidgety/restless 0 0 3  Suicidal thoughts 0 0 0  PHQ-9 Score 0 0  7   Difficult doing work/chores Not difficult at all Not difficult at all Not difficult at all     Data saved with a previous flowsheet row definition      08/27/2024   11:00 AM 06/12/2024    1:31 PM 02/09/2024    1:32 PM 01/05/2024    3:03 PM  GAD 7 : Generalized Anxiety Score  Nervous, Anxious, on Edge 0 0 0 1  Control/stop worrying 0 0 0 3  Worry too much - different things 0 0 1 3  Trouble relaxing 0 0 1 1  Restless 0 0 0 0  Easily annoyed or irritable 0 0 3 3  Afraid - awful might happen 0 0 0 0  Total GAD 7 Score 0 0 5 11  Anxiety Difficulty Not difficult at all Not difficult at all Not difficult  at all Somewhat difficult      The following portions of the patient's history were reviewed and updated as appropriate: past medical history, past surgical history, family history, social history, allergies, medications, and problem list.   Patient Active Problem List   Diagnosis Date Noted   Heart murmur 01/05/2024   Past Medical History:  Diagnosis Date   Bacterial vaginosis 06/11/2019   De Quervain's tenosynovitis, right 02/18/2020   Gallstones    Medical history non-contributory    Normal labor 08/08/2020   Rh negative status during pregnancy 12/01/2018   Wrist pain, chronic, right 10/31/2020   Past Surgical History:  Procedure Laterality Date   CHOLECYSTECTOMY N/A 12/04/2013   Procedure: LAPAROSCOPIC  CHOLECYSTECTOMY;  Surgeon: Lynda Leos, MD;  Location: [facility] OR;  Service: General;  Laterality: N/A;   DILATION AND CURETTAGE OF UTERUS     WISDOM TOOTH EXTRACTION     Family History  Problem Relation Age of Onset   Hypertension Mother    Diabetes Mother    Diabetes Maternal Grandmother    Current Medications[1] Allergies[2]   ROS: A complete ROS was performed with pertinent positives/negatives noted in the HPI. The remainder of the ROS are negative.    Objective:   Today's Vitals   08/27/24 1016  BP: (!) 122/92  Pulse: (!) 54  Temp: 97.8 F (36.6 C)  TempSrc: Temporal  SpO2: 99%  Weight: 178 lb (80.7 kg)  Height: 5' 2 (1.575 m)    GENERAL: Well-appearing, in NAD. Well nourished.  SKIN: Pink, warm and dry. No rash, lesion, ulceration, or ecchymoses.  NECK: Trachea midline. Full ROM w/o pain or tenderness. No lymphadenopathy.  RESPIRATORY: Chest wall symmetrical. Respirations even and non-labored. Breath sounds clear to auscultation bilaterally.  CARDIAC: S1, S2 present, regular rate and rhythm. Peripheral pulses 2+ bilaterally.  MSK: Muscle tone and strength appropriate for age. EXTREMITIES: Without clubbing, cyanosis, or edema.  NEUROLOGIC:  Steady, even gait.  PSYCH/MENTAL STATUS: Alert, oriented x 3. Cooperative, appropriate mood and affect.   There are no preventive care reminders to display for this patient.   No results found for any visits on 08/27/24.  The ASCVD Risk score (Arnett DK, et al., 2019) failed to calculate for the following reasons:   The 2019 ASCVD risk score is only valid for ages 66 to 53   * - Cholesterol units were assumed     Assessment & Plan:  Assessment and Plan    Abnormal uterine bleeding Likely stress-induced, resolved after addressing stressors. Initial evaluation negative for pregnancy, normal thyroid, and unremarkable pelvic ultrasound. Slight anemia due to blood loss, expected to resolve in 1-2 months since bleeding has  stopped.  - Monitor for recurrence of bleeding or amenorrhea.  Major depressive disorder, single episode, mild Mood improved after reuniting with daughter and graduating. Continues mental health counseling. - Continue mental health counseling. - Provided note for emotional support animal for housing.      No orders of the defined types were placed in this encounter.  No images are attached to the encounter or orders placed in the encounter. No orders of the defined types were placed in this encounter.   Return for Scheduled Routine Office Visits and as needed.   Rosina Senters, FNP     [1] No current outpatient medications on file. [2]  Allergies Allergen Reactions   Flagyl  [Metronidazole ] Nausea And Vomiting   "

## 2024-09-04 ENCOUNTER — Telehealth: Payer: Self-pay

## 2024-09-04 NOTE — Telephone Encounter (Signed)
 Spoke with pt about form completion sent in and completed still requesting more info waiting for call from pt landlord.

## 2024-09-06 NOTE — Telephone Encounter (Signed)
 Spoke with pt to let her know forms had been faxed to landlord.09/06/24

## 2024-10-10 ENCOUNTER — Encounter: Admitting: Internal Medicine
